# Patient Record
Sex: Female | Born: 1940
Health system: Southern US, Community
[De-identification: ages and names within clinical notes are randomized; demographics above are authoritative.]

## PROBLEM LIST (undated history)

## (undated) DIAGNOSIS — K8020 Calculus of gallbladder without cholecystitis without obstruction: Secondary | ICD-10-CM

## (undated) DIAGNOSIS — Z87442 Personal history of urinary calculi: Secondary | ICD-10-CM

## (undated) DIAGNOSIS — M810 Age-related osteoporosis without current pathological fracture: Secondary | ICD-10-CM

## (undated) DIAGNOSIS — M8580 Other specified disorders of bone density and structure, unspecified site: Secondary | ICD-10-CM

## (undated) DIAGNOSIS — K649 Unspecified hemorrhoids: Secondary | ICD-10-CM

## (undated) DIAGNOSIS — H6090 Unspecified otitis externa, unspecified ear: Secondary | ICD-10-CM

## (undated) DIAGNOSIS — B029 Zoster without complications: Secondary | ICD-10-CM

## (undated) DIAGNOSIS — E039 Hypothyroidism, unspecified: Secondary | ICD-10-CM

## (undated) DIAGNOSIS — K5900 Constipation, unspecified: Secondary | ICD-10-CM

## (undated) DIAGNOSIS — R32 Unspecified urinary incontinence: Secondary | ICD-10-CM

## (undated) DIAGNOSIS — M533 Sacrococcygeal disorders, not elsewhere classified: Principal | ICD-10-CM

## (undated) HISTORY — PX: APPENDECTOMY: SHX54

## (undated) HISTORY — PX: TONSILLECTOMY: SUR1361

## (undated) HISTORY — DX: Unspecified urinary incontinence: R32

## (undated) HISTORY — DX: Zoster without complications: B02.9

## (undated) HISTORY — DX: Calculus of gallbladder without cholecystitis without obstruction: K80.20

## (undated) HISTORY — PX: TUBAL LIGATION: SHX77

## (undated) HISTORY — DX: Age-related osteoporosis without current pathological fracture: M81.0

## (undated) HISTORY — DX: Unspecified hemorrhoids: K64.9

## (undated) HISTORY — PX: ABDOMINAL HYSTERECTOMY: SHX81

## (undated) HISTORY — DX: Constipation, unspecified: K59.00

## (undated) HISTORY — DX: Hypothyroidism, unspecified: E03.9

## (undated) HISTORY — DX: Sacrococcygeal disorders, not elsewhere classified: M53.3

## (undated) HISTORY — PX: OTHER SURGICAL HISTORY: SHX169

## (undated) HISTORY — DX: Other specified disorders of bone density and structure, unspecified site: M85.80

## (undated) HISTORY — DX: Unspecified otitis externa, unspecified ear: H60.90

---

## 2000-10-24 ENCOUNTER — Ambulatory Visit (HOSPITAL_COMMUNITY): Admission: RE | Admit: 2000-10-24 | Discharge: 2000-10-24 | Payer: Self-pay | Admitting: Specialist

## 2000-10-24 ENCOUNTER — Encounter: Payer: Self-pay | Admitting: Specialist

## 2001-03-03 ENCOUNTER — Encounter (HOSPITAL_COMMUNITY): Admission: RE | Admit: 2001-03-03 | Discharge: 2001-04-02 | Payer: Self-pay | Admitting: Preventative Medicine

## 2001-03-21 ENCOUNTER — Encounter: Payer: Self-pay | Admitting: Preventative Medicine

## 2001-03-21 ENCOUNTER — Ambulatory Visit (HOSPITAL_COMMUNITY): Admission: RE | Admit: 2001-03-21 | Discharge: 2001-03-21 | Payer: Self-pay | Admitting: Preventative Medicine

## 2001-04-18 ENCOUNTER — Encounter (HOSPITAL_COMMUNITY): Admission: RE | Admit: 2001-04-18 | Discharge: 2001-05-18 | Payer: Self-pay | Admitting: *Deleted

## 2001-05-26 ENCOUNTER — Encounter (HOSPITAL_COMMUNITY): Admission: RE | Admit: 2001-05-26 | Discharge: 2001-06-25 | Payer: Self-pay | Admitting: *Deleted

## 2001-11-07 ENCOUNTER — Encounter: Payer: Self-pay | Admitting: Specialist

## 2001-11-07 ENCOUNTER — Ambulatory Visit (HOSPITAL_COMMUNITY): Admission: RE | Admit: 2001-11-07 | Discharge: 2001-11-07 | Payer: Self-pay | Admitting: Specialist

## 2002-11-12 ENCOUNTER — Encounter: Payer: Self-pay | Admitting: Specialist

## 2002-11-12 ENCOUNTER — Ambulatory Visit (HOSPITAL_COMMUNITY): Admission: RE | Admit: 2002-11-12 | Discharge: 2002-11-12 | Payer: Self-pay | Admitting: Specialist

## 2003-11-20 ENCOUNTER — Ambulatory Visit (HOSPITAL_COMMUNITY): Admission: RE | Admit: 2003-11-20 | Discharge: 2003-11-20 | Payer: Self-pay | Admitting: Family Medicine

## 2003-12-16 ENCOUNTER — Ambulatory Visit (HOSPITAL_COMMUNITY): Admission: RE | Admit: 2003-12-16 | Discharge: 2003-12-16 | Payer: Self-pay | Admitting: Otolaryngology

## 2003-12-16 ENCOUNTER — Encounter (INDEPENDENT_AMBULATORY_CARE_PROVIDER_SITE_OTHER): Payer: Self-pay | Admitting: *Deleted

## 2003-12-16 ENCOUNTER — Ambulatory Visit (HOSPITAL_BASED_OUTPATIENT_CLINIC_OR_DEPARTMENT_OTHER): Admission: RE | Admit: 2003-12-16 | Discharge: 2003-12-16 | Payer: Self-pay | Admitting: Otolaryngology

## 2003-12-30 ENCOUNTER — Ambulatory Visit (HOSPITAL_COMMUNITY): Admission: RE | Admit: 2003-12-30 | Discharge: 2003-12-30 | Payer: Self-pay | Admitting: Specialist

## 2004-12-30 ENCOUNTER — Ambulatory Visit (HOSPITAL_COMMUNITY): Admission: RE | Admit: 2004-12-30 | Discharge: 2004-12-30 | Payer: Self-pay | Admitting: Specialist

## 2005-06-10 ENCOUNTER — Ambulatory Visit: Payer: Self-pay | Admitting: Internal Medicine

## 2005-06-29 ENCOUNTER — Encounter (INDEPENDENT_AMBULATORY_CARE_PROVIDER_SITE_OTHER): Payer: Self-pay | Admitting: *Deleted

## 2005-06-29 ENCOUNTER — Ambulatory Visit (HOSPITAL_COMMUNITY): Admission: RE | Admit: 2005-06-29 | Discharge: 2005-06-29 | Payer: Self-pay | Admitting: Internal Medicine

## 2005-06-29 ENCOUNTER — Ambulatory Visit: Payer: Self-pay | Admitting: Internal Medicine

## 2006-04-18 ENCOUNTER — Ambulatory Visit (HOSPITAL_COMMUNITY): Admission: RE | Admit: 2006-04-18 | Discharge: 2006-04-18 | Payer: Self-pay | Admitting: Obstetrics and Gynecology

## 2006-05-31 ENCOUNTER — Ambulatory Visit (HOSPITAL_COMMUNITY): Admission: RE | Admit: 2006-05-31 | Discharge: 2006-05-31 | Payer: Self-pay | Admitting: Obstetrics & Gynecology

## 2007-05-02 ENCOUNTER — Ambulatory Visit (HOSPITAL_COMMUNITY): Admission: RE | Admit: 2007-05-02 | Discharge: 2007-05-02 | Payer: Self-pay | Admitting: Family Medicine

## 2007-05-09 ENCOUNTER — Ambulatory Visit (HOSPITAL_COMMUNITY): Admission: RE | Admit: 2007-05-09 | Discharge: 2007-05-09 | Payer: Self-pay | Admitting: Family Medicine

## 2007-06-14 ENCOUNTER — Encounter (INDEPENDENT_AMBULATORY_CARE_PROVIDER_SITE_OTHER): Payer: Self-pay | Admitting: Urology

## 2007-06-14 ENCOUNTER — Ambulatory Visit (HOSPITAL_COMMUNITY): Admission: RE | Admit: 2007-06-14 | Discharge: 2007-06-14 | Payer: Self-pay | Admitting: Urology

## 2008-05-03 ENCOUNTER — Ambulatory Visit (HOSPITAL_COMMUNITY): Admission: RE | Admit: 2008-05-03 | Discharge: 2008-05-03 | Payer: Self-pay | Admitting: Family Medicine

## 2008-09-16 ENCOUNTER — Ambulatory Visit (HOSPITAL_COMMUNITY): Admission: RE | Admit: 2008-09-16 | Discharge: 2008-09-16 | Payer: Self-pay | Admitting: Obstetrics & Gynecology

## 2009-05-05 ENCOUNTER — Ambulatory Visit (HOSPITAL_COMMUNITY): Admission: RE | Admit: 2009-05-05 | Discharge: 2009-05-05 | Payer: Self-pay | Admitting: Family Medicine

## 2010-02-21 ENCOUNTER — Encounter: Payer: Self-pay | Admitting: Family Medicine

## 2010-02-22 ENCOUNTER — Encounter: Payer: Self-pay | Admitting: Family Medicine

## 2010-04-06 ENCOUNTER — Other Ambulatory Visit (HOSPITAL_BASED_OUTPATIENT_CLINIC_OR_DEPARTMENT_OTHER): Payer: Self-pay | Admitting: Pediatrics

## 2010-04-06 DIAGNOSIS — Z139 Encounter for screening, unspecified: Secondary | ICD-10-CM

## 2010-05-18 ENCOUNTER — Ambulatory Visit (HOSPITAL_COMMUNITY): Payer: Self-pay

## 2010-05-21 ENCOUNTER — Ambulatory Visit (HOSPITAL_COMMUNITY)
Admission: RE | Admit: 2010-05-21 | Discharge: 2010-05-21 | Disposition: A | Discharge status: Home / Self Care | Payer: Medicare Other | Source: Ambulatory Visit | Attending: Pediatrics | Admitting: Pediatrics

## 2010-05-21 DIAGNOSIS — Z139 Encounter for screening, unspecified: Secondary | ICD-10-CM

## 2010-05-21 DIAGNOSIS — Z1231 Encounter for screening mammogram for malignant neoplasm of breast: Secondary | ICD-10-CM | POA: Insufficient documentation

## 2010-06-16 NOTE — Op Note (Signed)
NAME:  Kelly Cook, Kelly Cook             ACCOUNT NO.:  [id number]   MEDICAL RECORD NO.:  [id number]          PATIENT TYPE:  AMB   LOCATION:  DAY                           FACILITY:  [facility]   PHYSICIAN:  Ky Barban, M.D.DATE OF BIRTH:  [birth year]   DATE OF PROCEDURE:  06/14/2007  DATE OF DISCHARGE:                               OPERATIVE REPORT   PREOPERATIVE DIAGNOSIS:  Right hydroureter.   POSTOPERATIVE DIAGNOSIS:  Normal right retrograde pyelogram.   PROCEDURE:  Cystoscopy and right retrograde pyelogram.   ANESTHESIA:  IV sedation plus local Xylocaine jelly in the urethra.   DESCRIPTION OF PROCEDURE:  The patient was given IV sedation MAC and  local anesthesia in the urethra.  After waiting adequate time, #25  cystoscope was introduced.  The large ureteral orifice looks normal and  a wedge catheter was introduced.  Hypaque was injected under  fluoroscopic control.  The right ureter looks normal of caliber.  I do  not see any filling defect.  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  The lower  collecting system is slightly dilated.  I do not see any filling defect.  All the instruments are removed.  The patient left the operating room in  the satisfactory condition.  I also want to mention, there is a cystic  area above the right kidney, which is outside and size is about 6 x 6 cm  and needs further clarification by the radiologist.      Ky Barban, M.D.  Electronically Signed     MIJ/MEDQ  D:  06/14/2007  T:  06/15/2007  Job:  [id number]

## 2010-06-19 NOTE — Op Note (Signed)
NAME:  Kelly Cook, Kelly Cook             ACCOUNT NO.:  [id number]   MEDICAL RECORD NO.:  [id number]          PATIENT TYPE:  AMB   LOCATION:  DAY                           FACILITY:  [facility]   PHYSICIAN:  Lionel December, M.D.    DATE OF BIRTH:  [birth year]   DATE OF PROCEDURE:  DATE OF DISCHARGE:                                 OPERATIVE REPORT   PROCEDURE:  Colonoscopy with polypectomy.   INDICATION:  A 70 year old Caucasian female with intermittent hematochezia  presumed to be secondary to hemorrhoids, who was recently noted to have heme-  positive stools.  Family history is negative for colorectal carcinoma.  Procedure risks were reviewed with the patient, informed consent was  obtained.   MEDS FOR CONSCIOUS SEDATION:  Demerol 50 mg IV, Versed 7 mg IV in divided  dose.   FINDINGS:  Procedure performed in endoscopy suite.  The patient's vital  signs and O2 sat were monitored during the procedure and remained stable.  The patient was placed in the left lateral position, rectal examination  performed.  No abnormality noted on external or digital exam.  Olympus  videoscope was placed in the rectum and advanced under vision to sigmoid  colon with tight turn.  The patient was placed in supine position.  I was  able to advance the scope proximally.  Preparation was satisfactory.  Scope  was passed to the cecum which was identified by appendiceal stump and  ileocecal valve.  Pictures taken for the record.  As the scope was withdrawn  colonic mucosa was carefully examined.  There was a small lipoma at  ascending colon which was left alone.  There was 6-7 mm sessile polyp at  rectosigmoid junction which was raised with submucosal injection of normal  saline and snared in one piece.  This polyp was retrieved for histology  examination.  Rectal mucosa was normal.  Scope was retroflexed to examine  anorectal junction and small hemorrhoids were noted below the dentate line.  Endoscope was then  withdrawn.  The patient tolerated the procedure well.   FINAL DIAGNOSIS:  1.  A 6 to 7 mm sessile polyp snared from rectosigmoid junction.  2.  Small external hemorrhoids.  3.  Small lipoma at the ascending colon, an incidental finding.   RECOMMENDATIONS:  No aspirin for 7 days.   I will be contacting the patient with results of biopsy.      Lionel December, M.D.  Electronically Signed     NR/MEDQ  D:  06/29/2005  T:  06/29/2005  Job:  [id number]   cc:   Cyril Mourning, MSN   Jeoffrey Massed, MD  Fax: [phone]

## 2010-06-19 NOTE — Op Note (Signed)
NAME:  Kelly Cook, Kelly Cook             ACCOUNT NO.:  [id number]   MEDICAL RECORD NO.:  [id number]          PATIENT TYPE:  AMB   LOCATION:  DSC                          FACILITY:  [facility]   PHYSICIAN:  Jefry H. Pollyann Kennedy, MD     DATE OF BIRTH:  02-01-1941   DATE OF PROCEDURE:  12/16/2003  DATE OF DISCHARGE:                                 OPERATIVE REPORT   PREOPERATIVE DIAGNOSIS:  Right parotid mass.   POSTOPERATIVE DIAGNOSIS:  Right parotid mass.   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   SURGEON:  Jefry H. Pollyann Kennedy, M.D.   ASSISTANT:  Kinnie Scales. Annalee Genta, M.D.   General endotracheal anesthesia was used.  No complications.  Blood loss 10  mL.   FINDINGS:  A 2 cm rounded, mobile mass in the deep aspect of the tail of the  parotid gland.  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   REFERRING PHYSICIAN:  Jeoffrey Massed, M.D.   HISTORY:  This is a 70 year old lady who was found to have a mass in the  right parotid gland on CT evaluation.  Risks, benefits, alternatives, and  complications of the procedure were explained to the patient, who seemed to  understand and agreed to surgery.   PROCEDURE:  The patient was taken to the operating room and placed on the  operating table in a supine position.  Following induction of general  endotracheal anesthesia, the face was prepped and draped in the standard  fashion.  A preauricular incision was outlined with a marking pen with  continuation behind the ear lobe, around the mastoid process, and down into  a lower cervical skin crease.  Electrocautery was used to incise the skin  and subcutaneous tissue.  The great auricular nerve was identified and the  posterior branch was preserved and reflected posteriorly.  The skin flap was  developed anteriorly.  The parotid tail was dissected off of the upper  sternocleidomastoid muscle and then off of  the external auditory canal.  The  main trunk of the facial nerve was identified just superior to the posterior  belly of the digastric muscle.  This was followed out to the pes anserinus  and through all branches.  A complete lateral lobe dissection was  accomplished, preserving all branches of the facial nerve.  After the  lateral lobe was removed, the deeper lobe inferiorly was inspected.  The  mass was identified.  This was carefully dissected posteriorly to the lower  branches of the facial nerve just behind the angle of the mandible.  Using  blunt dissection, this lesion was removed in its entirety and sent for  frozen section evaluation.  Bipolar cautery was used as needed for  hemostasis, as were 4-0 silk ties.  The wound was irrigated with saline,  hemostasis was completed, and the wound was closed in layers using 3-0  chromic on the deep layer and running 5-0 nylon on the skin.  A 7 [place]  round JP drain was left in the wound, exited through  the inferior aspect of  the incision, and secured in place with a  nylon suture.  Prior to closure, the main trunk and upper and lower  divisions were tested using a Folger nerve stimulator at half milliamp, and  there was full movement in all branches.  The patient was then awakened from  anesthesia, extubated, and transferred to recovery in stable condition.      Jefr   JHR/MEDQ  D:  12/16/2003  T:  12/16/2003  Job:  [id number]   cc:   Jeoffrey Massed, MD  [address]  [place]  [place] [address]  Fax: [phone]

## 2010-06-19 NOTE — Consult Note (Signed)
NAME:  Kelly Cook             ACCOUNT NO.:  [id number]   MEDICAL RECORD NO.:  [id number]          PATIENT TYPE:  AMB   LOCATION:  DAY                           FACILITY:  [facility]   PHYSICIAN:  Lionel December, M.D.    DATE OF BIRTH:  1940-12-25   DATE OF CONSULTATION:  06/10/2005  DATE OF DISCHARGE:                                   CONSULTATION   REASON FOR CONSULTATION:  Heme-positive stools.   HISTORY OF PRESENT ILLNESS:  Kelly Cook is a 70 year old Caucasian female who is  referred through the courtesy of Ms. Cyril Mourning, and Dr. Rayna Sexton  office for GI evaluation.  She recently had a yearly exam at their office  and was noted to have heme-positive stools.  She does give history of  intermittent hematochezia.  She subsequently had three Hemoccult's which  were negative.  The patient denies diarrhea or constipation, frank bleeding  or melena.  At times, she has soreness secondary to hemorrhoids.  She has a  good appetite.  She has not lost or gained any weight in the last 1 year.  She states she has dysphagia. She states this has been a chronic problem.  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  It is not progressive and  she does not desire any further evaluation.  She also complains of  exertional dyspnea after taking one flight of stairs which was new; however,  this is not associated with palpitations, chest pain or postural symptoms.  She has not discussed this symptom with Dr. Santiago Bumpers, her primary care  physician.  She had recent lab studies which are reviewed below.  She did  have a fingerstick at Dr. Rayna Sexton and the hemoglobin was 12.5 grams.   [name] had  flexible sigmoidoscopy in June 1997. She had external hemorrhoids  and a single polyp was taken out from her sigmoid colon which was  hyperplastic.  She actually has never undergone a full colonoscopy. Review  of the systems is negative for heartburn, hoarseness or chronic cough.   She is on the over  thyroxine 112 mcg q.d., Actonel 35 mg q. weekly, Citrucel  one cap q.d. and Tylenol p.r.n.Marland Kitchen   PAST MEDICAL HISTORY:  Hypothyroidism was diagnosed 20 years ago, osteopenia  1 year ago.  She had a tonsillectomy at age 63 and appendectomy around the  same year.  She had a tubal ligation at age 43 after she had a handicap  daughter.  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   ALLERGIES:  NKA.   FAMILY HISTORY:  Father died of lymphoma at age 35.  The mother at 18 has  dementia but she enjoyed reasonably good health until about 3 years ago.  She is [facility] at the present time. Kelly Cook has three brothers and  they are in good health.  She had one first cousin die of colon carcinoma at  age 41.   SOCIAL HISTORY:  She is married.  She has a daughter who is being cared for  at [facility]. She is around 30 and there is like a 74-year-old.  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  She  smoked cigarettes for 25 years about a pack a day but quit 23 years ago.  She does not drink alcohol.   PHYSICAL EXAM:  GENERAL:  Pleasant, mildly obese, Caucasian female who is in  no acute distress.  She weighs 179 pounds, she is 5 feet 2 inches tall.  VITAL SIGNS:  Pulse 68 per minute, blood pressure 114/80, temperature is  98.8.  HEENT:  Conjunctivae is pink.  Sclerae is nonicteric.  Oropharyngeal mucosa  is normal.  No neck masses or thyromegaly noted.  CARDIAC:  Regular rhythm.  Normal S1, S2.  No murmur or gallop noted.  LUNGS:  Clear to auscultation.  ABDOMEN:  Protuberant but soft and nontender without organomegaly or masses.  RECTAL:  Examination deferred.  EXTREMITIES:  No peripheral edema or clubbing noted.   RECENT LABS:  A comprehensive panel on April 23, 2005 was normal except a  glucose of 120. It was repeated on May 25, 2005 and her fasting glucose  was 113, first one was  nonfasting. Her TSH on May 25, 2005 was 1.942.  She  also had lipid profile report on the chart.   ASSESSMENT:  Kelly Cook is a 70 year old Caucasian female with intermittent  hematochezia possibly secondary to hemorrhoids.  She was noted to have heme-  positive stool which may be due to her hemorrhoids but colonic neoplasm  needs to be ruled out.  She had sigmoidoscopy back in 1997, but she has  never had a colonoscopy.  Her risk for colon carcinoma would be average or  may be just above average since she had one first cousin die of colon  carcinoma at age 100.  However she does not have any worrisome symptoms.  Reassuring to note that her hemoglobin was 12-1/2 grams.   She has exertional dyspnea.  This may be due to gradual weight gain but I  feel this needs to be further evaluated and will be deferred to Dr. Milinda Cave,  her primary care physician.   RECOMMENDATIONS:  Colonoscopy to be performed at [facility] in the near future.  I  have reviewed the procedure and risks with the patient and she is agreeable.   We made copy of all of her lab studies for Dr. Milinda Cave.  She will call their  office and make an office visit for further evaluation of her exertional  dyspnea.   We would like to thank Ms. Cyril Mourning, MSN for the opportunity to  participate in the care of this nice lady.      Lionel December, M.D.  Electronically Signed     NR/MEDQ  D:  06/10/2005  T:  06/11/2005  Job:  [id number]   cc:   Cyril Mourning, MSN   Tilda Burrow, M.D.  Fax: [phone]   Jeoffrey Massed, MD  Fax: [phone]

## 2010-08-20 ENCOUNTER — Other Ambulatory Visit: Payer: Self-pay | Admitting: Obstetrics & Gynecology

## 2010-08-20 DIAGNOSIS — M8580 Other specified disorders of bone density and structure, unspecified site: Secondary | ICD-10-CM

## 2010-09-28 ENCOUNTER — Ambulatory Visit (HOSPITAL_COMMUNITY)
Admission: RE | Admit: 2010-09-28 | Discharge: 2010-09-28 | Disposition: A | Discharge status: Home / Self Care | Payer: Medicare Other | Source: Ambulatory Visit | Attending: Obstetrics & Gynecology | Admitting: Obstetrics & Gynecology

## 2010-09-28 DIAGNOSIS — M8580 Other specified disorders of bone density and structure, unspecified site: Secondary | ICD-10-CM

## 2010-09-28 DIAGNOSIS — M899 Disorder of bone, unspecified: Secondary | ICD-10-CM | POA: Insufficient documentation

## 2010-09-28 DIAGNOSIS — M949 Disorder of cartilage, unspecified: Secondary | ICD-10-CM | POA: Insufficient documentation

## 2010-10-16 ENCOUNTER — Encounter: Payer: Self-pay | Admitting: *Deleted

## 2010-10-16 ENCOUNTER — Emergency Department (HOSPITAL_COMMUNITY): Payer: Medicare Other

## 2010-10-16 ENCOUNTER — Emergency Department (HOSPITAL_COMMUNITY)
Admission: EM | Admit: 2010-10-16 | Discharge: 2010-10-17 | Disposition: A | Discharge status: Home / Self Care | Payer: Medicare Other | Attending: Emergency Medicine | Admitting: Emergency Medicine

## 2010-10-16 DIAGNOSIS — E119 Type 2 diabetes mellitus without complications: Secondary | ICD-10-CM | POA: Insufficient documentation

## 2010-10-16 DIAGNOSIS — R110 Nausea: Secondary | ICD-10-CM | POA: Insufficient documentation

## 2010-10-16 DIAGNOSIS — E079 Disorder of thyroid, unspecified: Secondary | ICD-10-CM | POA: Insufficient documentation

## 2010-10-16 DIAGNOSIS — R6883 Chills (without fever): Secondary | ICD-10-CM

## 2010-10-16 DIAGNOSIS — R509 Fever, unspecified: Secondary | ICD-10-CM | POA: Insufficient documentation

## 2010-10-16 NOTE — ED Provider Notes (Signed)
History     CSN: [id number] Arrival date & time: 10/16/2010  9:22 PM   Chief Complaint  Patient presents with  . Chills     (Include location/radiation/quality/duration/timing/severity/associated sxs/prior treatment) HPI Comments: Seen 2306  Patient is a 70 y.o. female presenting with fever. The history is provided by the patient.  Fever Primary symptoms of the febrile illness include fever. The current episode started today (patient had a flu shot today and this evening around 7:30 developed shaking chills.). This is a new problem. The problem has been resolved. Primary symptoms comment: chills and possible fever, did not take her temperature     Past Medical History  Diagnosis Date  . Diabetes mellitus   . Thyroid disease      Past Surgical History  Procedure Date  . Tonsillectomy   . Appendectomy   . Abdominal hysterectomy     No family history on file.  History  Substance Use Topics  . Smoking status: Not on file  . Smokeless tobacco: Not on file  . Alcohol Use: No    OB History    Grav Para Term Preterm Abortions TAB SAB Ect Mult Living                  Review of Systems  Constitutional: Positive for fever.    Allergies  Review of patient's allergies indicates no known allergies.  Home Medications   Current Outpatient Rx  Name Route Sig Dispense Refill  . CALCIUM-VITAMIN D 500-200 MG-UNIT PO TABS Oral Take 1 tablet by mouth daily.      Marland Kitchen LEVOTHYROXINE SODIUM 100 MCG PO TABS Oral Take 100 mcg by mouth daily.      Marland Kitchen METFORMIN HCL 500 MG PO TABS Oral Take 500 mg by mouth 2 (two) times daily with a meal.      . METAMUCIL PO Oral Take 1 scoop by mouth daily. Dissolved in liquid and drink.     Marland Kitchen SIMVASTATIN 10 MG PO TABS Oral Take 10 mg by mouth daily.      . TRIAMCINOLONE ACETONIDE 0.1 % EX CREA Topical Apply 1 application topically as needed. For rash       Physical Exam    BP 141/72  Pulse 105  Temp(Src) 99.6 F (37.6 C) (Oral)  Resp 16  Ht  5\' 1"  (1.549 m)  Wt 170 lb (77.111 kg)  BMI 32.12 kg/m2  SpO2 93%  Physical Exam  Nursing note and vitals reviewed. Constitutional: She is oriented to person, place, and time. She appears well-developed and well-nourished.  HENT:  Head: Normocephalic and atraumatic.  Right Ear: External ear normal.  Left Ear: External ear normal.  Nose: Nose normal.  Mouth/Throat: Oropharynx is clear and moist.  Eyes: EOM are normal.  Neck: Normal range of motion. Neck supple.  Cardiovascular: Normal rate, normal heart sounds and intact distal pulses.   Pulmonary/Chest: Effort normal and breath sounds normal.  Abdominal: Soft.  Musculoskeletal: Normal range of motion.  Neurological: She is alert and oriented to person, place, and time.  Skin: Skin is warm and dry.    ED Course  Procedures  Results for orders placed during the hospital encounter of 10/16/10  CBC      Component Value Range   WBC 6.2  4.0 - 10.5 (K/uL)   RBC 4.41  3.87 - 5.11 (MIL/uL)   Hemoglobin 13.9  12.0 - 15.0 (g/dL)   HCT 40.9  81.1 - 91.4 (%)   MCV 93.4  78.0 -  100.0 (fL)   MCH 31.5  26.0 - 34.0 (pg)   MCHC 33.7  30.0 - 36.0 (g/dL)   RDW 40.9  81.1 - 91.4 (%)   Platelets 182  150 - 400 (K/uL)  DIFFERENTIAL      Component Value Range   Neutrophils Relative 80 (*) 43 - 77 (%)   Neutro Abs 5.0  1.7 - 7.7 (K/uL)   Lymphocytes Relative 10 (*) 12 - 46 (%)   Lymphs Abs 0.6 (*) 0.7 - 4.0 (K/uL)   Monocytes Relative 8  3 - 12 (%)   Monocytes Absolute 0.5  0.1 - 1.0 (K/uL)   Eosinophils Relative 2  0 - 5 (%)   Eosinophils Absolute 0.2  0.0 - 0.7 (K/uL)   Basophils Relative 1  0 - 1 (%)   Basophils Absolute 0.0  0.0 - 0.1 (K/uL)  BASIC METABOLIC PANEL      Component Value Range   Sodium 136  135 - 145 (mEq/L)   Potassium 3.5  3.5 - 5.1 (mEq/L)   Chloride 103  96 - 112 (mEq/L)   CO2 22  19 - 32 (mEq/L)   Glucose, Bld 143 (*) 70 - 99 (mg/dL)   BUN 16  6 - 23 (mg/dL)   Creatinine, Ser 7.82  0.50 - 1.10 (mg/dL)    Calcium 9.9  8.4 - 10.5 (mg/dL)   GFR calc non Af Amer >60  >60 (mL/min)   GFR calc Af Amer >60  >60 (mL/min)  URINALYSIS, ROUTINE W REFLEX MICROSCOPIC      Component Value Range   Color, Urine YELLOW  YELLOW    Appearance CLEAR  CLEAR    Specific Gravity, Urine 1.020  1.005 - 1.030    pH 7.0  5.0 - 8.0    Glucose, UA NEGATIVE  NEGATIVE (mg/dL)   Hgb urine dipstick TRACE (*) NEGATIVE    Bilirubin Urine NEGATIVE  NEGATIVE    Ketones, ur NEGATIVE  NEGATIVE (mg/dL)   Protein, ur NEGATIVE  NEGATIVE (mg/dL)   Urobilinogen, UA 0.2  0.0 - 1.0 (mg/dL)   Nitrite NEGATIVE  NEGATIVE    Leukocytes, UA NEGATIVE  NEGATIVE   URINE MICROSCOPIC-ADD ON      Component Value Range   Squamous Epithelial / LPF RARE  RARE    WBC, UA 0-2  <3 (WBC/hpf)   RBC / HPF 0-2  <3 (RBC/hpf)   Bacteria, UA RARE  RARE    Dg Chest 2 View  10/17/2010  *RADIOLOGY REPORT*  Clinical Data: Nausea, muscle spasms, former smoker, diabetes, recent flu shot  CHEST - 2 VIEW  Comparison: None  Findings: Upper normal heart size. Mediastinal contours and pulmonary vascularity normal. Minimal peribronchial thickening. No pulmonary infiltrate, pleural effusion or pneumothorax. No acute osseous findings.  IMPRESSION: Minimal bronchitic changes.  Original Report Authenticated By: Lollie Marrow, M.D.   Dg Bone Density  09/28/2010  The Bone Mineral Densitometry report has been sent to the ordering physician.  This report can be viewed through [facility]/EPIC (Steps: Chart Review>>Imaging>>PACS Images)  or by logging into [employer].  Original Report Authenticated By: Lollie Marrow, M.D. lts  Patient with flu shot today who developed shaking chills. Labs unremarkable. Chest xray normal. VSS. Will follow up with her doctor. MDM Reviewed: nursing note and vitals Interpretation: labs and x-ray          Nicoletta Dress. Colon Branch, MD 10/17/10 [id number]

## 2010-10-16 NOTE — ED Notes (Signed)
States she had a flu shot today, now she has chills

## 2010-10-16 NOTE — ED Notes (Signed)
Alert, talking says she had a flu shot this am and later had chill for app 1 hour.  No chills now, feels better,  No distress.No cough or cold sx . No sx of uti

## 2010-10-17 LAB — DIFFERENTIAL
Basophils Absolute: 0 10*3/uL (ref 0.0–0.1)
Eosinophils Absolute: 0.2 10*3/uL (ref 0.0–0.7)
Eosinophils Relative: 2 % (ref 0–5)
Lymphocytes Relative: 10 % — ABNORMAL LOW (ref 12–46)
Lymphs Abs: 0.6 10*3/uL — ABNORMAL LOW (ref 0.7–4.0)
Neutrophils Relative %: 80 % — ABNORMAL HIGH (ref 43–77)

## 2010-10-17 LAB — URINALYSIS, ROUTINE W REFLEX MICROSCOPIC
Bilirubin Urine: NEGATIVE
Glucose, UA: NEGATIVE mg/dL
Leukocytes, UA: NEGATIVE
Nitrite: NEGATIVE
Protein, ur: NEGATIVE mg/dL
Specific Gravity, Urine: 1.02 (ref 1.005–1.030)
Urobilinogen, UA: 0.2 mg/dL (ref 0.0–1.0)
pH: 7 (ref 5.0–8.0)

## 2010-10-17 LAB — CBC
HCT: 41.2 % (ref 36.0–46.0)
Hemoglobin: 13.9 g/dL (ref 12.0–15.0)
MCHC: 33.7 g/dL (ref 30.0–36.0)
MCV: 93.4 fL (ref 78.0–100.0)
Platelets: 182 10*3/uL (ref 150–400)
RBC: 4.41 MIL/uL (ref 3.87–5.11)
RDW: 12.9 % (ref 11.5–15.5)
WBC: 6.2 10*3/uL (ref 4.0–10.5)

## 2010-10-17 LAB — BASIC METABOLIC PANEL
BUN: 16 mg/dL (ref 6–23)
CO2: 22 mEq/L (ref 19–32)
Calcium: 9.9 mg/dL (ref 8.4–10.5)
Creatinine, Ser: 0.69 mg/dL (ref 0.50–1.10)
GFR calc Af Amer: >60 mL/min (ref >60)
GFR calc non Af Amer: >60 mL/min (ref >60)
Glucose, Bld: 143 mg/dL — ABNORMAL HIGH (ref 70–99)
Potassium: 3.5 mEq/L (ref 3.5–5.1)
Sodium: 136 mEq/L (ref 135–145)

## 2010-10-17 LAB — URINE MICROSCOPIC-ADD ON

## 2011-02-20 ENCOUNTER — Other Ambulatory Visit: Payer: Self-pay | Admitting: Obstetrics & Gynecology

## 2011-04-13 ENCOUNTER — Other Ambulatory Visit (HOSPITAL_COMMUNITY): Payer: Self-pay | Admitting: Pediatrics

## 2011-04-13 DIAGNOSIS — Z139 Encounter for screening, unspecified: Secondary | ICD-10-CM

## 2011-05-25 ENCOUNTER — Ambulatory Visit (HOSPITAL_COMMUNITY)
Admission: RE | Admit: 2011-05-25 | Discharge: 2011-05-25 | Disposition: A | Discharge status: Home / Self Care | Payer: Medicare Other | Source: Ambulatory Visit | Attending: Pediatrics | Admitting: Pediatrics

## 2011-05-25 DIAGNOSIS — Z1231 Encounter for screening mammogram for malignant neoplasm of breast: Secondary | ICD-10-CM | POA: Insufficient documentation

## 2011-05-25 DIAGNOSIS — Z139 Encounter for screening, unspecified: Secondary | ICD-10-CM

## 2012-03-20 ENCOUNTER — Other Ambulatory Visit (HOSPITAL_COMMUNITY): Payer: Self-pay | Admitting: Internal Medicine

## 2012-04-27 ENCOUNTER — Other Ambulatory Visit (HOSPITAL_COMMUNITY): Payer: Self-pay | Admitting: Internal Medicine

## 2012-04-27 DIAGNOSIS — Z139 Encounter for screening, unspecified: Secondary | ICD-10-CM

## 2012-05-26 ENCOUNTER — Ambulatory Visit (HOSPITAL_COMMUNITY)
Admission: RE | Admit: 2012-05-26 | Discharge: 2012-05-26 | Disposition: A | Discharge status: Home / Self Care | Payer: Medicare Other | Source: Ambulatory Visit | Attending: Internal Medicine | Admitting: Internal Medicine

## 2012-05-26 DIAGNOSIS — Z139 Encounter for screening, unspecified: Secondary | ICD-10-CM

## 2012-05-26 DIAGNOSIS — Z1231 Encounter for screening mammogram for malignant neoplasm of breast: Secondary | ICD-10-CM | POA: Insufficient documentation

## 2012-06-15 ENCOUNTER — Encounter: Payer: Self-pay | Admitting: *Deleted

## 2012-06-16 ENCOUNTER — Ambulatory Visit (INDEPENDENT_AMBULATORY_CARE_PROVIDER_SITE_OTHER): Payer: 59 | Admitting: Adult Health

## 2012-06-16 ENCOUNTER — Encounter: Payer: Self-pay | Admitting: Adult Health

## 2012-06-16 VITALS — BP 120/70 | Ht 61.0 in | Wt 169.0 lb

## 2012-06-16 DIAGNOSIS — Z01419 Encounter for gynecological examination (general) (routine) without abnormal findings: Secondary | ICD-10-CM

## 2012-06-16 DIAGNOSIS — K5900 Constipation, unspecified: Secondary | ICD-10-CM

## 2012-06-16 DIAGNOSIS — E039 Hypothyroidism, unspecified: Secondary | ICD-10-CM | POA: Insufficient documentation

## 2012-06-16 DIAGNOSIS — M810 Age-related osteoporosis without current pathological fracture: Secondary | ICD-10-CM

## 2012-06-16 DIAGNOSIS — E119 Type 2 diabetes mellitus without complications: Secondary | ICD-10-CM | POA: Insufficient documentation

## 2012-06-16 DIAGNOSIS — Z1212 Encounter for screening for malignant neoplasm of rectum: Secondary | ICD-10-CM

## 2012-06-16 HISTORY — DX: Hypothyroidism, unspecified: E03.9

## 2012-06-16 HISTORY — DX: Constipation, unspecified: K59.00

## 2012-06-16 LAB — HEMOCCULT GUIAC POC 1CARD (OFFICE): Fecal Occult Blood, POC: NEGATIVE

## 2012-06-16 NOTE — Patient Instructions (Addendum)
Follow up in 2 years  Mammogram yearly Colonoscopy 2017 Labs at PCP Sign up for my chart

## 2012-06-16 NOTE — Assessment & Plan Note (Signed)
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

## 2012-06-16 NOTE — Progress Notes (Signed)
Patient ID: Wendall Papa, female   DOB: 1940-04-09, 72 y.o.   MRN: [id number] History of Present Illness: Dewayne Hatch is a 72 year old white female married in for her gyn physical. She is retired and has a garden.  Current Medications, Allergies, Past Medical History, Past Surgical History, Family History and Social History were reviewed in [employer] record.    Review of Systems: Patient denies any headaches, blurred vision, shortness of breath, chest pain, abdominal pain, problems with  urination. She has constipation and takes metamucil. No longer has sex. No mood changes  Physical Exam:Blood pressure 120/70, height 5\' 1"  (1.549 m), weight 169 lb (76.658 kg). General:  Well developed, well nourished, no acute distress Skin:  Warm and dry Neck:  Midline trachea, normal thyroid, no carotid bruits heard Lungs; Clear to auscultation bilaterally Breast:  No dominant palpable mass, retraction, or nipple discharge Cardiovascular: Regular rate and rhythm Abdomen:  Soft, non tender, no hepatosplenomegaly Pelvic:  External genitalia is normal in appearance for age.  The vagina is atrohphic. The cervix and uterus are absent.  No adnexal masses or tenderness noted. Rectal: Good sphincter tone, no polyps, or hemorrhoids felt.  Hemoccult negative. Extremities:  No swelling or varicosities noted Psych:  Alert and cooperative,in good mood, seems happy  Impression: Yearly gyn exam, no pap Hypothyroidism Diabetes, last A1c 6.1 per pt. Constipation Osteopenia   Plan: Mammogram yearly  Colonoscopy in 2017 Labs with PCP Physical in 2 years

## 2012-11-30 ENCOUNTER — Ambulatory Visit: Payer: 59 | Admitting: Adult Health

## 2012-12-01 ENCOUNTER — Encounter: Payer: Self-pay | Admitting: Adult Health

## 2012-12-01 ENCOUNTER — Encounter (INDEPENDENT_AMBULATORY_CARE_PROVIDER_SITE_OTHER): Payer: Self-pay

## 2012-12-01 ENCOUNTER — Ambulatory Visit (INDEPENDENT_AMBULATORY_CARE_PROVIDER_SITE_OTHER): Payer: 59 | Admitting: Adult Health

## 2012-12-01 VITALS — BP 142/70 | Ht 62.0 in | Wt 171.0 lb

## 2012-12-01 DIAGNOSIS — H60399 Other infective otitis externa, unspecified ear: Secondary | ICD-10-CM

## 2012-12-01 DIAGNOSIS — H6091 Unspecified otitis externa, right ear: Secondary | ICD-10-CM

## 2012-12-01 DIAGNOSIS — M533 Sacrococcygeal disorders, not elsewhere classified: Secondary | ICD-10-CM | POA: Insufficient documentation

## 2012-12-01 DIAGNOSIS — H6090 Unspecified otitis externa, unspecified ear: Secondary | ICD-10-CM | POA: Insufficient documentation

## 2012-12-01 HISTORY — DX: Sacrococcygeal disorders, not elsewhere classified: M53.3

## 2012-12-01 HISTORY — DX: Unspecified otitis externa, unspecified ear: H60.90

## 2012-12-01 MED ORDER — NAPROXEN 375 MG PO TBEC: 1.0000 | DELAYED_RELEASE_TABLET | Freq: Two times a day (BID) | ORAL | Status: DC

## 2012-12-01 MED ORDER — CIPROFLOXACIN-HYDROCORTISONE 0.2-1 % OT SUSP: 3.0000 [drp] | Freq: Two times a day (BID) | OTIC | Status: DC

## 2012-12-01 NOTE — Progress Notes (Signed)
Subjective:     Patient ID: Wendall Papa, female   DOB: Feb 27, 1940, 72 y.o.   MRN: [id number]  HPI Dewayne Hatch is a 72 year old white female in complaining of pain in tail bone, no known injury and right ear hurts x about 2 weeks feel like in water.  Review of Systems See HPI Reviewed past medical,surgical, social and family history. Reviewed medications and allergies.     Objective:   Physical Exam BP 142/70  Ht 5\' 2"  (1.575 m)  Wt 171 lb (77.565 kg)  BMI 31.27 kg/m2   Left ear clear TM pearly gray, right ear has redness and mild swelling on canal, on pelvic and rectal exam no pain or masses, on external spine exam tender at coccyx and it is prominent and deviated to left, discussed with Dr Emelda Fear, as she saw chiropractor too.I feel it may be inflammation and arthritis And will try NSAID.  Assessment:     Coccyodynia Otitis externa    Plan:    Rx Naproxen 375 mg 1 bid x 10 days Use warm compress to site, use pillow if sitting on hard surface Rx Cipro HC Otic, 3 gtts bid x 7 days to right ear Follow up in 2 weeks if not better will xray

## 2012-12-01 NOTE — Patient Instructions (Signed)
Follow up in 2 weeks Use warm compress

## 2012-12-15 ENCOUNTER — Encounter: Payer: Self-pay | Admitting: Adult Health

## 2012-12-15 ENCOUNTER — Ambulatory Visit (INDEPENDENT_AMBULATORY_CARE_PROVIDER_SITE_OTHER): Payer: 59 | Admitting: Adult Health

## 2012-12-15 VITALS — BP 118/60 | Ht 62.0 in | Wt 166.0 lb

## 2012-12-15 DIAGNOSIS — M533 Sacrococcygeal disorders, not elsewhere classified: Secondary | ICD-10-CM

## 2012-12-15 NOTE — Patient Instructions (Signed)
continue what's working Call prn

## 2012-12-15 NOTE — Progress Notes (Signed)
Subjective:     Patient ID: Kelly Cook, female   DOB: Nov 29, 1940, 72 y.o.   MRN: [id number]  HPI Kelly Cook is back in follow up for right otitis externa and pain in her tail bone.She says she is much better, can sleep and exercise with pain.  Review of Systems See HPI Reviewed past medical,surgical, social and family history. Reviewed medications and allergies.     Objective:   Physical Exam BP 118/60  Ht 5\' 2"  (1.575 m)  Wt 166 lb (75.297 kg)  BMI 30.35 kg/m2   right ear looks good, no redness or swelling, and she says tail bone better,  Assessment:    Coccygodynia-better Resolved otitis externa    Plan:     Follow up prn  Can use aleve if needed now, continue to exercise, but if pain returns back off

## 2013-03-22 ENCOUNTER — Other Ambulatory Visit (HOSPITAL_COMMUNITY): Payer: Self-pay | Admitting: Internal Medicine

## 2013-03-22 DIAGNOSIS — M810 Age-related osteoporosis without current pathological fracture: Secondary | ICD-10-CM

## 2013-03-28 ENCOUNTER — Other Ambulatory Visit (HOSPITAL_COMMUNITY): Payer: Medicare Other

## 2013-03-28 ENCOUNTER — Ambulatory Visit (HOSPITAL_COMMUNITY)
Admission: RE | Admit: 2013-03-28 | Discharge: 2013-03-28 | Disposition: A | Discharge status: Home / Self Care | Payer: Medicare Other | Source: Ambulatory Visit | Attending: Internal Medicine | Admitting: Internal Medicine

## 2013-03-28 DIAGNOSIS — M810 Age-related osteoporosis without current pathological fracture: Secondary | ICD-10-CM

## 2013-03-28 DIAGNOSIS — Z13820 Encounter for screening for osteoporosis: Secondary | ICD-10-CM | POA: Insufficient documentation

## 2013-04-03 ENCOUNTER — Telehealth: Payer: Self-pay | Admitting: Adult Health

## 2013-04-03 NOTE — Telephone Encounter (Signed)
Left message to have her call me.

## 2013-04-25 ENCOUNTER — Other Ambulatory Visit (HOSPITAL_COMMUNITY): Payer: Self-pay | Admitting: Internal Medicine

## 2013-04-25 DIAGNOSIS — Z1231 Encounter for screening mammogram for malignant neoplasm of breast: Secondary | ICD-10-CM

## 2013-05-29 ENCOUNTER — Ambulatory Visit (HOSPITAL_COMMUNITY)
Admission: RE | Admit: 2013-05-29 | Discharge: 2013-05-29 | Disposition: A | Discharge status: Home / Self Care | Payer: Medicare Other | Source: Ambulatory Visit | Attending: Internal Medicine | Admitting: Internal Medicine

## 2013-05-29 DIAGNOSIS — Z1231 Encounter for screening mammogram for malignant neoplasm of breast: Secondary | ICD-10-CM

## 2013-06-26 ENCOUNTER — Encounter: Payer: Self-pay | Admitting: Adult Health

## 2013-06-26 ENCOUNTER — Ambulatory Visit (INDEPENDENT_AMBULATORY_CARE_PROVIDER_SITE_OTHER): Payer: 59 | Admitting: Adult Health

## 2013-06-26 VITALS — BP 118/72 | HR 74 | Ht 62.0 in | Wt 173.0 lb

## 2013-06-26 DIAGNOSIS — Z01419 Encounter for gynecological examination (general) (routine) without abnormal findings: Secondary | ICD-10-CM

## 2013-06-26 DIAGNOSIS — Z1212 Encounter for screening for malignant neoplasm of rectum: Secondary | ICD-10-CM

## 2013-06-26 LAB — HEMOCCULT GUIAC POC 1CARD (OFFICE): Fecal Occult Blood, POC: NEGATIVE

## 2013-06-26 NOTE — Progress Notes (Signed)
Patient ID: Kelly Cook, female   DOB: 02/02/1940, 73 y.o.   MRN: [id number] History of Present Illness: Kelly "Lelon Frohlich" is a 73 year old white female, married in for a physical. No complaints.  Current Medications, Allergies, Past Medical History, Past Surgical History, Family History and Social History were reviewed in [employer] record.     Review of Systems: Patient denies any headaches, blurred vision, shortness of breath, chest pain, abdominal pain, problems with bowel movements, urination, or intercourse. She is not having sex.No joint swelling or mood swings, she line dances and goes to gym 2-3 x per week.    Physical Exam:BP 118/72  Pulse 74  Ht 5\' 2"  (1.575 m)  Wt 173 lb (78.472 kg)  BMI 31.63 kg/m2 General:  Well developed, well nourished, no acute distress Skin:  Warm and dry Neck:  Midline trachea, normal thyroid, no carotid briuts Lungs; Clear to auscultation bilaterally Breast:  No dominant palpable mass, retraction, or nipple discharge Cardiovascular: Regular rate and rhythm Abdomen:  Soft, non tender, no hepatosplenomegaly Pelvic:  External genitalia is normal in appearance for age.  The vagina is atrophic. The cervix and uterus are absent.  No  adnexal masses or tenderness noted. Rectal: Good sphincter tone, no polyps,has internal hemorrhoids felt.  Hemoccult negative. Extremities:  No swelling or rash noted Psych:  No mood changes, alert and cooperative,seems happy   Impression: Yearly gyn exam no pap    Plan: Physical in 2 year Mammogram yearly  Labs with PCP Colonoscopy per Dr Laural Golden

## 2013-06-26 NOTE — Patient Instructions (Signed)
Physical in 2 years Mammogram yearly  labs with PCP Colonoscopy as per Dr Laural Golden

## 2013-12-03 ENCOUNTER — Encounter: Payer: Self-pay | Admitting: Adult Health

## 2014-05-06 ENCOUNTER — Other Ambulatory Visit (HOSPITAL_COMMUNITY): Payer: Self-pay | Admitting: Internal Medicine

## 2014-05-06 DIAGNOSIS — Z1231 Encounter for screening mammogram for malignant neoplasm of breast: Secondary | ICD-10-CM

## 2014-06-07 ENCOUNTER — Ambulatory Visit (HOSPITAL_COMMUNITY)
Admission: RE | Admit: 2014-06-07 | Discharge: 2014-06-07 | Disposition: A | Discharge status: Home / Self Care | Payer: Medicare Other | Source: Ambulatory Visit | Attending: Internal Medicine | Admitting: Internal Medicine

## 2014-06-07 DIAGNOSIS — Z1231 Encounter for screening mammogram for malignant neoplasm of breast: Secondary | ICD-10-CM | POA: Diagnosis present

## 2015-05-22 ENCOUNTER — Other Ambulatory Visit (HOSPITAL_COMMUNITY): Payer: Self-pay | Admitting: Internal Medicine

## 2015-05-22 DIAGNOSIS — Z1231 Encounter for screening mammogram for malignant neoplasm of breast: Secondary | ICD-10-CM

## 2015-06-11 ENCOUNTER — Ambulatory Visit (HOSPITAL_COMMUNITY)
Admission: RE | Admit: 2015-06-11 | Discharge: 2015-06-11 | Disposition: A | Discharge status: Home / Self Care | Payer: Medicare Other | Source: Ambulatory Visit | Attending: Internal Medicine | Admitting: Internal Medicine

## 2015-06-11 DIAGNOSIS — Z1231 Encounter for screening mammogram for malignant neoplasm of breast: Secondary | ICD-10-CM | POA: Diagnosis present

## 2015-06-17 ENCOUNTER — Encounter (INDEPENDENT_AMBULATORY_CARE_PROVIDER_SITE_OTHER): Payer: Self-pay | Admitting: *Deleted

## 2015-07-02 ENCOUNTER — Encounter: Payer: Self-pay | Admitting: Adult Health

## 2015-07-02 ENCOUNTER — Ambulatory Visit (INDEPENDENT_AMBULATORY_CARE_PROVIDER_SITE_OTHER): Payer: Medicare Other | Admitting: Adult Health

## 2015-07-02 VITALS — BP 140/60 | HR 74 | Ht 61.0 in | Wt 164.5 lb

## 2015-07-02 DIAGNOSIS — Z01419 Encounter for gynecological examination (general) (routine) without abnormal findings: Secondary | ICD-10-CM | POA: Diagnosis not present

## 2015-07-02 DIAGNOSIS — R32 Unspecified urinary incontinence: Secondary | ICD-10-CM

## 2015-07-02 DIAGNOSIS — N3946 Mixed incontinence: Secondary | ICD-10-CM | POA: Diagnosis not present

## 2015-07-02 DIAGNOSIS — Z1211 Encounter for screening for malignant neoplasm of colon: Secondary | ICD-10-CM | POA: Diagnosis not present

## 2015-07-02 HISTORY — DX: Unspecified urinary incontinence: R32

## 2015-07-02 LAB — HEMOCCULT GUIAC POC 1CARD (OFFICE): FECAL OCCULT BLD: NEGATIVE

## 2015-07-02 NOTE — Progress Notes (Signed)
Patient ID: Kelly Cook, female   DOB: 1940/04/06, 75 y.o.   MRN: [id number] History of Present Illness: Kelly Cook is s 75 year old white female, married, G1P1, sp hysterectomy in for a well woman gyn exam. PCP is Wende Neighbors, MD.   Current Medications, Allergies, Past Medical History, Past Surgical History, Family History and Social History were reviewed in [facility] record.     Review of Systems: Patient denies any headaches, hearing loss, fatigue, blurred vision, shortness of breath, chest pain, abdominal pain, problems with bowel movements, she uses metamucil.She is no longer sexually active and has stress and urge incontinence and uses a pad. No joint pain or mood swings.    Physical Exam:BP 140/60 mmHg  Pulse 74  Ht 5\' 1"  (1.549 m)  Wt 164 lb 8 oz (74.617 kg)  BMI 31.10 kg/m2 General:  Well developed, well nourished, no acute distress Skin:  Warm and dry Neck:  Midline trachea, normal thyroid, good ROM, no lymphadenopathy,no carotid bruits heard Lungs; Clear to auscultation bilaterally Breast:  No dominant palpable mass, retraction, or nipple discharge Cardiovascular: Regular rate and rhythm Abdomen:  Soft, non tender, no hepatosplenomegaly Pelvic:  External genitalia is normal in appearance, no lesions.  The vagina is atrophic. Urethra has no lesions or masses. The cervix and uterus are absent.  No adnexal masses or tenderness noted.Bladder is non tender, no masses felt. Rectal: Good sphincter tone, no polyps, internal hemorrhoids felt.  Hemoccult negative. Extremities/musculoskeletal:  No swelling or varicosities noted, no clubbing or cyanosis Psych:  No mood changes, alert and cooperative,seems happy   Impression:  Well woman gyn exam no pap Mixed urinary incontinence    Plan: Physical in 2 years Mammogram yearly Colonoscopy per GI Labs with PCP

## 2015-07-02 NOTE — Patient Instructions (Signed)
Physical in 2 years Mammogram yearly Colonoscopy per GI Labs with PCP

## 2015-08-04 ENCOUNTER — Other Ambulatory Visit (INDEPENDENT_AMBULATORY_CARE_PROVIDER_SITE_OTHER): Payer: Self-pay | Admitting: *Deleted

## 2015-08-04 DIAGNOSIS — Z1211 Encounter for screening for malignant neoplasm of colon: Secondary | ICD-10-CM

## 2015-10-10 ENCOUNTER — Encounter (INDEPENDENT_AMBULATORY_CARE_PROVIDER_SITE_OTHER): Payer: Self-pay | Admitting: *Deleted

## 2015-10-10 ENCOUNTER — Telehealth (INDEPENDENT_AMBULATORY_CARE_PROVIDER_SITE_OTHER): Payer: Self-pay | Admitting: *Deleted

## 2015-10-10 MED ORDER — PEG 3350-KCL-NA BICARB-NACL 420 G PO SOLR: 4000.0000 mL | Freq: Once | ORAL | 0 refills | Status: AC

## 2015-10-10 NOTE — Telephone Encounter (Signed)
Patient needs trilyte 

## 2015-10-30 ENCOUNTER — Telehealth (INDEPENDENT_AMBULATORY_CARE_PROVIDER_SITE_OTHER): Payer: Self-pay | Admitting: *Deleted

## 2015-10-30 NOTE — Telephone Encounter (Signed)
Referring MD/PCP: hall   Procedure: tcs  Reason/Indication:  screening  Has patient had this procedure before?  Yes, 10 yrs ago   If so, when, by whom and where?    Is there a family history of colon cancer?  no  Who?  What age when diagnosed?    Is patient diabetic?   yes      Does patient have prosthetic heart valve or mechanical valve?  no  Do you have a pacemaker?  no  Has patient ever had endocarditis? no  Has patient had joint replacement within last 12 months?  no  Does patient tend to be constipated or take laxatives? yes  Does patient have a history of alcohol/drug use?  no  Is patient on Coumadin, Plavix and/or Aspirin? yes  Medications: asa 81 mg daily, levothyroxine 100 mcg daily, alendronate 70 mg 1 weekly, metformin 500 mg bid, metamucil 1 heaping tsp daily, prenatal vit daily  Allergies: nkda  Medication Adjustment: asa 2 days, hold metformin evening before and morning of  Procedure date & time: 11/27/15 at 730

## 2015-10-30 NOTE — Telephone Encounter (Signed)
agree

## 2015-11-27 ENCOUNTER — Encounter (HOSPITAL_COMMUNITY): Payer: Self-pay | Admitting: *Deleted

## 2015-11-27 ENCOUNTER — Encounter (HOSPITAL_COMMUNITY)
Admission: RE | Disposition: A | Discharge status: Home / Self Care | Payer: Self-pay | Source: Ambulatory Visit | Attending: Internal Medicine

## 2015-11-27 ENCOUNTER — Ambulatory Visit (HOSPITAL_COMMUNITY)
Admission: RE | Admit: 2015-11-27 | Discharge: 2015-11-27 | Disposition: A | Discharge status: Home / Self Care | Payer: Medicare Other | Source: Ambulatory Visit | Attending: Internal Medicine | Admitting: Internal Medicine

## 2015-11-27 DIAGNOSIS — Z1211 Encounter for screening for malignant neoplasm of colon: Secondary | ICD-10-CM | POA: Diagnosis not present

## 2015-11-27 DIAGNOSIS — E119 Type 2 diabetes mellitus without complications: Secondary | ICD-10-CM | POA: Insufficient documentation

## 2015-11-27 DIAGNOSIS — Z7982 Long term (current) use of aspirin: Secondary | ICD-10-CM | POA: Insufficient documentation

## 2015-11-27 DIAGNOSIS — Z7984 Long term (current) use of oral hypoglycemic drugs: Secondary | ICD-10-CM | POA: Diagnosis not present

## 2015-11-27 DIAGNOSIS — E039 Hypothyroidism, unspecified: Secondary | ICD-10-CM | POA: Diagnosis not present

## 2015-11-27 DIAGNOSIS — Z87891 Personal history of nicotine dependence: Secondary | ICD-10-CM | POA: Insufficient documentation

## 2015-11-27 DIAGNOSIS — Z7983 Long term (current) use of bisphosphonates: Secondary | ICD-10-CM | POA: Insufficient documentation

## 2015-11-27 DIAGNOSIS — K644 Residual hemorrhoidal skin tags: Secondary | ICD-10-CM | POA: Insufficient documentation

## 2015-11-27 HISTORY — PX: COLONOSCOPY: SHX5424

## 2015-11-27 LAB — GLUCOSE, CAPILLARY: GLUCOSE-CAPILLARY: 132 mg/dL — AB (ref 65–99)

## 2015-11-27 SURGERY — COLONOSCOPY
Anesthesia: Moderate Sedation

## 2015-11-27 MED ORDER — MEPERIDINE HCL 50 MG/ML IJ SOLN
INTRAMUSCULAR | Status: AC
  Filled 2015-11-27: qty 1

## 2015-11-27 MED ORDER — SODIUM CHLORIDE 0.9 % IV SOLN
INTRAVENOUS | Status: DC
  Administered 2015-11-27: 1000 mL via INTRAVENOUS

## 2015-11-27 MED ORDER — SIMETHICONE 40 MG/0.6ML PO SUSP
ORAL | Status: AC
  Filled 2015-11-27: qty 30

## 2015-11-27 MED ORDER — MIDAZOLAM HCL 5 MG/5ML IJ SOLN
INTRAMUSCULAR | Status: DC | PRN
  Administered 2015-11-27: 1 mg via INTRAVENOUS
  Administered 2015-11-27: 2 mg via INTRAVENOUS
  Administered 2015-11-27: 1 mg via INTRAVENOUS
  Administered 2015-11-27: 2 mg via INTRAVENOUS

## 2015-11-27 MED ORDER — SIMETHICONE 40 MG/0.6ML PO SUSP
ORAL | Status: DC | PRN
  Administered 2015-11-27: 2.5 mL

## 2015-11-27 MED ORDER — MIDAZOLAM HCL 5 MG/5ML IJ SOLN
INTRAMUSCULAR | Status: AC
  Filled 2015-11-27: qty 10

## 2015-11-27 MED ORDER — MEPERIDINE HCL 50 MG/ML IJ SOLN
INTRAMUSCULAR | Status: DC | PRN
  Administered 2015-11-27 (×2): 25 mg via INTRAVENOUS

## 2015-11-27 NOTE — Op Note (Signed)
[facility] Patient Name: Kelly Cook Procedure Date: 11/27/2015 7:34 AM MRN: [id number] Date of Birth: Jun 10, 1940 Attending MD: Hildred Laser , MD CSN: [id number] Age: 75 Admit Type: Outpatient Procedure:                Colonoscopy Indications:              Screening for colorectal malignant neoplasm Providers:                Hildred Laser, MD, Lurline Del, RN, Purcell Nails. [place],                            Technician Referring MD:             Delphina Cahill, MD Medicines:                Meperidine 50 mg IV, Midazolam 6 mg IV Complications:            No immediate complications. Estimated Blood Loss:     Estimated blood loss: none. Procedure:                Pre-Anesthesia Assessment:                           - Prior to the procedure, a History and Physical                            was performed, and patient medications and                            allergies were reviewed. The patient's tolerance of                            previous anesthesia was also reviewed. The risks                            and benefits of the procedure and the sedation                            options and risks were discussed with the patient.                            All questions were answered, and informed consent                            was obtained. Prior Anticoagulants: The patient                            last took aspirin 2 days prior to the procedure.                            ASA Grade Assessment: II - A patient with mild                            systemic disease. After reviewing the risks and  benefits, the patient was deemed in satisfactory                            condition to undergo the procedure.                           After obtaining informed consent, the colonoscope                            was passed under direct vision. Throughout the                            procedure, the patient's blood pressure, pulse, and     oxygen saturations were monitored continuously. The                            [id number]) scope was introduced through                            the anus and advanced to the the cecum, identified                            by appendiceal orifice and ileocecal valve. The                            colonoscopy was performed without difficulty. The                            patient tolerated the procedure well. The quality                            of the bowel preparation was excellent. The                            ileocecal valve, appendiceal orifice, and rectum                            were photographed. Scope In: 7:43:24 AM Scope Out: [id number] AM Scope Withdrawal Time: 0 hours 7 minutes 13 seconds  Total Procedure Duration: 0 hours 22 minutes 49 seconds  Findings:      The colon (entire examined portion) appeared normal.      External hemorrhoids were found during retroflexion. The hemorrhoids       were small. Impression:               - The entire examined colon is normal.                           - External hemorrhoids.                           - No specimens collected. Moderate Sedation:      Moderate (conscious) sedation was administered by the endoscopy nurse       and supervised by the endoscopist. The following parameters were       monitored: oxygen saturation, heart rate,  blood pressure, CO2       capnography and response to care. Total physician intraservice time was       29 minutes. Recommendation:           - Patient has a contact number available for                            emergencies. The signs and symptoms of potential                            delayed complications were discussed with the                            patient. Return to normal activities tomorrow.                            Written discharge instructions were provided to the                            patient.                           - Continue present medications.                            - Resume previous diet.                           - No repeat colonoscopy due to age. Procedure Code(s):        --- Professional ---                           [id number], Colonoscopy, flexible; diagnostic, including                            collection of specimen(s) by brushing or washing,                            when performed (separate procedure)                           99152, Moderate sedation services provided by the                            same physician or other qualified health care                            professional performing the diagnostic or                            therapeutic service that the sedation supports,                            requiring the presence of an independent trained  observer to assist in the monitoring of the                            patient's level of consciousness and physiological                            status; initial 15 minutes of intraservice time,                            patient age 37 years or older                           [phone], Moderate sedation services; each additional                            15 minutes intraservice time Diagnosis Code(s):        --- Professional ---                           Z12.11, Encounter for screening for malignant                            neoplasm of colon                           K64.4, Residual hemorrhoidal skin tags CPT copyright 2016 American Medical Association. All rights reserved. The codes documented in this report are preliminary and upon coder review may  be revised to meet current compliance requirements. Hildred Laser, MD Hildred Laser, MD 11/27/2015 8:15:20 AM This report has been signed electronically. Number of Addenda: 0

## 2015-11-27 NOTE — Discharge Instructions (Signed)
Resume usual medications and diet. No driving for 24 hours.    Colonoscopy, Care After These instructions give you information on caring for yourself after your procedure. Your doctor may also give you more specific instructions. Call your doctor if you have any problems or questions after your procedure. HOME CARE  Do not drive for 24 hours.  Do not sign important papers or use machinery for 24 hours.  You may shower.  You may go back to your usual activities, but go slower for the first 24 hours.  Take rest breaks often during the first 24 hours.  Walk around or use warm packs on your belly (abdomen) if you have belly cramping or gas.  Drink enough fluids to keep your pee (urine) clear or pale yellow.  Resume your normal diet. Avoid heavy or fried foods.  Avoid drinking alcohol for 24 hours or as told by your doctor.  Only take medicines as told by your doctor. If a tissue sample (biopsy) was taken during the procedure:   Do not take aspirin or blood thinners for 7 days, or as told by your doctor.  Do not drink alcohol for 7 days, or as told by your doctor.  Eat soft foods for the first 24 hours. GET HELP IF: You still have a small amount of blood in your poop (stool) 2-3 days after the procedure. GET HELP RIGHT AWAY IF:  You have more than a small amount of blood in your poop.  You see clumps of tissue (blood clots) in your poop.  Your belly is puffy (swollen).  You feel sick to your stomach (nauseous) or throw up (vomit).  You have a fever.  You have belly pain that gets worse and medicine does not help. MAKE SURE YOU:  Understand these instructions.  Will watch your condition.  Will get help right away if you are not doing well or get worse.   This information is not intended to replace advice given to you by your health care provider. Make sure you discuss any questions you have with your health care provider.   Document Released: 02/20/2010 Document  Revised: 01/23/2013 Document Reviewed: 09/25/2012 Elsevier Interactive Patient Education 2016 [employer].  Hemorrhoids Hemorrhoids are swollen veins around the rectum or anus. There are two types of hemorrhoids:   Internal hemorrhoids. These occur in the veins just inside the rectum. They may poke through to the outside and become irritated and painful.  External hemorrhoids. These occur in the veins outside the anus and can be felt as a painful swelling or hard lump near the anus. CAUSES  Pregnancy.   Obesity.   Constipation or diarrhea.   Straining to have a bowel movement.   Sitting for long periods on the toilet.  Heavy lifting or other activity that caused you to strain.  Anal intercourse. SYMPTOMS   Pain.   Anal itching or irritation.   Rectal bleeding.   Fecal leakage.   Anal swelling.   One or more lumps around the anus.  DIAGNOSIS  Your caregiver may be able to diagnose hemorrhoids by visual examination. Other examinations or tests that may be performed include:   Examination of the rectal area with a gloved hand (digital rectal exam).   Examination of anal canal using a small tube (scope).   A blood test if you have lost a significant amount of blood.  A test to look inside the colon (sigmoidoscopy or colonoscopy). TREATMENT Most hemorrhoids can be treated at home. However,  if symptoms do not seem to be getting better or if you have a lot of rectal bleeding, your caregiver may perform a procedure to help make the hemorrhoids get smaller or remove them completely. Possible treatments include:   Placing a rubber band at the base of the hemorrhoid to cut off the circulation (rubber band ligation).   Injecting a chemical to shrink the hemorrhoid (sclerotherapy).   Using a tool to burn the hemorrhoid (infrared light therapy).   Surgically removing the hemorrhoid (hemorrhoidectomy).   Stapling the hemorrhoid to block blood flow to the  tissue (hemorrhoid stapling).  HOME CARE INSTRUCTIONS   Eat foods with fiber, such as whole grains, beans, nuts, fruits, and vegetables. Ask your doctor about taking products with added fiber in them (fibersupplements).  Increase fluid intake. Drink enough water and fluids to keep your urine clear or pale yellow.   Exercise regularly.   Go to the bathroom when you have the urge to have a bowel movement. Do not wait.   Avoid straining to have bowel movements.   Keep the anal area dry and clean. Use wet toilet paper or moist towelettes after a bowel movement.   Medicated creams and suppositories may be used or applied as directed.   Only take over-the-counter or prescription medicines as directed by your caregiver.   Take warm sitz baths for 15-20 minutes, 3-4 times a day to ease pain and discomfort.   Place ice packs on the hemorrhoids if they are tender and swollen. Using ice packs between sitz baths may be helpful.   Put ice in a plastic bag.   Place a towel between your skin and the bag.   Leave the ice on for 15-20 minutes, 3-4 times a day.   Do not use a donut-shaped pillow or sit on the toilet for long periods. This increases blood pooling and pain.  SEEK MEDICAL CARE IF:  You have increasing pain and swelling that is not controlled by treatment or medicine.  You have uncontrolled bleeding.  You have difficulty or you are unable to have a bowel movement.  You have pain or inflammation outside the area of the hemorrhoids. MAKE SURE YOU:  Understand these instructions.  Will watch your condition.  Will get help right away if you are not doing well or get worse.   This information is not intended to replace advice given to you by your health care provider. Make sure you discuss any questions you have with your health care provider.   Document Released: 01/16/2000 Document Revised: 01/05/2012 Document Reviewed: 11/23/2011 Elsevier Interactive Patient  Education [employer].

## 2015-11-27 NOTE — H&P (Signed)
Kelly Cook is an 75 y.o. female.   Chief Complaint: Patient is here for colonoscopy. HPI: Patient is 75 year old Caucasian female who is here for screening colonoscopy. She denies abdominal pain change in bowel habits or frank rectal bleeding. She has history of hemorrhoids and time sees blood on the tissue. Last colonoscopy was 10 years ago. History is negative for CRC.  Past Medical History:  Diagnosis Date  . 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 Constipation 06/16/2012  . Diabetes mellitus   . Gall bladder stones   . Hemorrhoids         06/16/2012  . Hypothyroid 06/16/2012      . Osteoporosis   . Otitis externa 12/01/2012  . Shingles   . Urinary incontinence 07/02/2015    Past Surgical History:  Procedure Laterality Date  . ABDOMINAL HYSTERECTOMY     dysfunctional uterine bleeding  . APPENDECTOMY    . salivary tumor    . TONSILLECTOMY     adenoids  . TUBAL LIGATION      Family History  Problem Relation Age of Onset  . Cancer Father   . Dementia Mother   . Diabetes Brother   . Heart disease Brother   . Mental retardation Daughter     in group home   Social History:  reports that she has quit smoking. Her smoking use included Cigarettes. She quit after 24.00 years of use. She has never used smokeless tobacco. She reports that she does not drink alcohol or use drugs.  Allergies: No Known Allergies  Medications Prior to Admission  Medication Sig Dispense Refill  . alendronate (FOSAMAX) 70 MG tablet take 70mg  once weekly  2  . aspirin 81 MG tablet Take 81 mg by mouth at bedtime.     Marland Kitchen levothyroxine (SYNTHROID, LEVOTHROID) 100 MCG tablet Take 100 mcg by mouth daily.     . metFORMIN (GLUCOPHAGE) 500 MG tablet Take 500 mg by mouth 2 (two) times daily with a meal.      . Prenatal Vit-Fe Fumarate-FA (PRENATAL VITAMIN PO) Take 1 tablet by mouth daily.     . Psyllium (METAMUCIL PO) Take 1 scoop by  mouth daily. Dissolved in liquid and drink.     . 1st Choice Lancets Ultra Thin MISC       Results for orders placed or performed during the hospital encounter of 11/27/15 (from the past 48 hour(s))  Glucose, capillary     Status: Abnormal   Collection Time: 11/27/15  7:22 AM  Result Value Ref Range   Glucose-Capillary 132 (H) 65 - 99 mg/dL   No results found.  ROS  Blood pressure (!) 141/67, pulse 72, temperature 97.8 F (36.6 C), temperature source Oral, resp. rate 16, height 5\' 2"  (1.575 m), weight 159 lb (72.1 kg), SpO2 98 %. Physical Exam  Constitutional: She appears well-developed and well-nourished.  HENT:  Mouth/Throat: Oropharynx is clear and moist.  Eyes: Conjunctivae are normal. No scleral icterus.  Neck: No thyromegaly present.  Cardiovascular: Normal rate, regular rhythm and normal heart sounds.   No murmur heard. Respiratory: Effort normal and breath sounds normal.  GI: Soft. She exhibits no distension and no mass. There is no tenderness.  Musculoskeletal: She exhibits no edema.  Lymphadenopathy:    She has no cervical adenopathy.  Neurological: She is alert.  Skin: Skin is warm and dry.     Assessment/Plan Average risk screening colonoscopy.  Hildred Laser, MD 11/27/2015, 7:33 AM

## 2015-12-01 ENCOUNTER — Encounter (HOSPITAL_COMMUNITY): Payer: Self-pay | Admitting: Internal Medicine

## 2016-01-12 ENCOUNTER — Ambulatory Visit (INDEPENDENT_AMBULATORY_CARE_PROVIDER_SITE_OTHER): Payer: Medicare Other | Admitting: Otolaryngology

## 2016-01-12 DIAGNOSIS — R1312 Dysphagia, oropharyngeal phase: Secondary | ICD-10-CM | POA: Diagnosis not present

## 2016-01-12 DIAGNOSIS — D3709 Neoplasm of uncertain behavior of other specified sites of the oral cavity: Secondary | ICD-10-CM | POA: Diagnosis not present

## 2016-04-29 ENCOUNTER — Other Ambulatory Visit (HOSPITAL_COMMUNITY): Payer: Self-pay | Admitting: Internal Medicine

## 2016-04-29 DIAGNOSIS — Z1231 Encounter for screening mammogram for malignant neoplasm of breast: Secondary | ICD-10-CM

## 2016-06-14 ENCOUNTER — Ambulatory Visit (HOSPITAL_COMMUNITY)
Admission: RE | Admit: 2016-06-14 | Discharge: 2016-06-14 | Disposition: A | Discharge status: Home / Self Care | Payer: Medicare Other | Source: Ambulatory Visit | Attending: Internal Medicine | Admitting: Internal Medicine

## 2016-06-14 DIAGNOSIS — Z1231 Encounter for screening mammogram for malignant neoplasm of breast: Secondary | ICD-10-CM | POA: Diagnosis present

## 2016-10-24 ENCOUNTER — Encounter (HOSPITAL_COMMUNITY): Payer: Self-pay

## 2016-10-24 ENCOUNTER — Emergency Department (HOSPITAL_COMMUNITY)
Admission: EM | Admit: 2016-10-24 | Discharge: 2016-10-24 | Disposition: A | Discharge status: Home / Self Care | Payer: Medicare Other | Attending: Emergency Medicine | Admitting: Emergency Medicine

## 2016-10-24 ENCOUNTER — Emergency Department (HOSPITAL_COMMUNITY): Payer: Medicare Other

## 2016-10-24 DIAGNOSIS — E119 Type 2 diabetes mellitus without complications: Secondary | ICD-10-CM | POA: Insufficient documentation

## 2016-10-24 DIAGNOSIS — R1084 Generalized abdominal pain: Secondary | ICD-10-CM | POA: Diagnosis present

## 2016-10-24 DIAGNOSIS — Z7982 Long term (current) use of aspirin: Secondary | ICD-10-CM | POA: Insufficient documentation

## 2016-10-24 DIAGNOSIS — N200 Calculus of kidney: Secondary | ICD-10-CM | POA: Diagnosis not present

## 2016-10-24 DIAGNOSIS — R319 Hematuria, unspecified: Secondary | ICD-10-CM | POA: Diagnosis not present

## 2016-10-24 DIAGNOSIS — Z87891 Personal history of nicotine dependence: Secondary | ICD-10-CM | POA: Insufficient documentation

## 2016-10-24 DIAGNOSIS — Z7984 Long term (current) use of oral hypoglycemic drugs: Secondary | ICD-10-CM | POA: Insufficient documentation

## 2016-10-24 DIAGNOSIS — Z79899 Other long term (current) drug therapy: Secondary | ICD-10-CM | POA: Insufficient documentation

## 2016-10-24 LAB — COMPREHENSIVE METABOLIC PANEL
ALT: 22 U/L (ref 14–54)
ANION GAP: 7 (ref 5–15)
AST: 17 U/L (ref 15–41)
Albumin: 4.2 g/dL (ref 3.5–5.0)
Alkaline Phosphatase: 36 U/L — ABNORMAL LOW (ref 38–126)
BUN: 14 mg/dL (ref 6–20)
CHLORIDE: 105 mmol/L (ref 101–111)
CO2: 27 mmol/L (ref 22–32)
Calcium: 9.2 mg/dL (ref 8.9–10.3)
Creatinine, Ser: 0.93 mg/dL (ref 0.44–1.00)
GFR, EST NON AFRICAN AMERICAN: 59 mL/min — AB (ref >60)
Glucose, Bld: 150 mg/dL — ABNORMAL HIGH (ref 65–99)
POTASSIUM: 3.7 mmol/L (ref 3.5–5.1)
Sodium: 139 mmol/L (ref 135–145)
Total Bilirubin: 0.8 mg/dL (ref 0.3–1.2)
Total Protein: 7.6 g/dL (ref 6.5–8.1)

## 2016-10-24 LAB — URINALYSIS, ROUTINE W REFLEX MICROSCOPIC
BACTERIA UA: NONE SEEN
BILIRUBIN URINE: NEGATIVE
GLUCOSE, UA: NEGATIVE mg/dL
KETONES UR: NEGATIVE mg/dL
LEUKOCYTES UA: NEGATIVE
NITRITE: NEGATIVE
PROTEIN: 100 mg/dL — AB
SQUAMOUS EPITHELIAL / LPF: NONE SEEN
Specific Gravity, Urine: 1.013 (ref 1.005–1.030)
pH: 5 (ref 5.0–8.0)

## 2016-10-24 LAB — CBC
HEMATOCRIT: 39.5 % (ref 36.0–46.0)
Hemoglobin: 13.5 g/dL (ref 12.0–15.0)
MCH: 33.9 pg (ref 26.0–34.0)
MCHC: 34.2 g/dL (ref 30.0–36.0)
MCV: 99.2 fL (ref 78.0–100.0)
Platelets: 283 10*3/uL (ref 150–400)
RBC: 3.98 MIL/uL (ref 3.87–5.11)
RDW: 14.2 % (ref 11.5–15.5)
WBC: 7.5 10*3/uL (ref 4.0–10.5)

## 2016-10-24 LAB — LIPASE, BLOOD: LIPASE: 34 U/L (ref 11–51)

## 2016-10-24 MED ORDER — ONDANSETRON HCL 4 MG/2ML IJ SOLN
4.0000 mg | Freq: Once | INTRAMUSCULAR | Status: AC
  Administered 2016-10-24: 4 mg via INTRAVENOUS
  Filled 2016-10-24: qty 2

## 2016-10-24 MED ORDER — SODIUM CHLORIDE 0.9 % IV BOLUS (SEPSIS)
500.0000 mL | Freq: Once | INTRAVENOUS | Status: AC
  Administered 2016-10-24: 500 mL via INTRAVENOUS

## 2016-10-24 MED ORDER — IOPAMIDOL (ISOVUE-300) INJECTION 61%
100.0000 mL | Freq: Once | INTRAVENOUS | Status: AC | PRN
  Administered 2016-10-24: 100 mL via INTRAVENOUS

## 2016-10-24 MED ORDER — ONDANSETRON HCL 4 MG PO TABS: 4.0000 mg | ORAL_TABLET | Freq: Four times a day (QID) | ORAL | 0 refills | Status: DC

## 2016-10-24 MED ORDER — OXYCODONE-ACETAMINOPHEN 5-325 MG PO TABS: 1.0000 | ORAL_TABLET | ORAL | 0 refills | Status: DC | PRN

## 2016-10-24 MED ORDER — DICYCLOMINE HCL 20 MG PO TABS: 20.0000 mg | ORAL_TABLET | Freq: Two times a day (BID) | ORAL | 0 refills | Status: DC

## 2016-10-24 NOTE — Discharge Instructions (Signed)
Scan shows a 7 mm kidney stone on the left side. Recommend follow-up with urology. Phone number given. Prescription for pain medicine, nausea medicine, cramping medicine

## 2016-10-24 NOTE — ED Provider Notes (Signed)
[facility] DEPT Provider Note   CSN: [id number] Arrival date & time: 10/24/16  3016     History   Chief Complaint Chief Complaint  Patient presents with  . Abdominal Pain    HPI Kelly Cook is a 76 y.o. female.  Intermittent periumbilical abdominal cramping for 10 days with associated diarrhea. Today patient noted blood in her urine.  No flank pain, dysuria, fever, sweats, chills. No history of hematuria in the past.  Past medical history includes diabetes and hypertension.  Severity of symptoms is moderate.  Nothing makes sxs better or worse.      Past Medical History:  Diagnosis Date  . 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 Constipation 06/16/2012  . Diabetes mellitus   . Gall bladder stones   . Hemorrhoids   . Hyperthyroidism   . Hypothyroid 06/16/2012  . Hypothyroid 06/16/2012  . Osteopenia   . Osteoporosis   . Otitis externa 12/01/2012  . Shingles   . Urinary incontinence 07/02/2015    Patient Active Problem List   Diagnosis Date Noted  . Urinary incontinence 07/02/2015  . 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 Otitis externa 12/01/2012  . Hypothyroid 06/16/2012  . Diabetes ([place]) 06/16/2012  . Osteoporosis 06/16/2012  . Constipation 06/16/2012    Past Surgical History:  Procedure Laterality Date  . ABDOMINAL HYSTERECTOMY     dysfunctional uterine bleeding  . APPENDECTOMY    . 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 TONSILLECTOMY     adenoids  . TUBAL LIGATION      OB History    Gravida Para Term Preterm AB Living   1 1           SAB TAB Ectopic Multiple Live Births                   Home Medications    Prior to Admission medications   Medication Sig Start Date End Date Taking? Authorizing Provider  alendronate (FOSAMAX) 70 MG tablet  take 70mg  once weekly 04/28/15  Yes [provider]  aspirin 81 MG tablet Take 81 mg by mouth at bedtime.    Yes [provider]  levothyroxine (SYNTHROID, LEVOTHROID) 88 MCG tablet Take 1 tablet by mouth at bedtime.  08/26/16  Yes [provider]  metFORMIN (GLUCOPHAGE) 500 MG tablet Take 500 mg by mouth at bedtime.    Yes [provider]  Prenatal Vit-Fe Fumarate-FA (PRENATAL VITAMIN PO) Take 1 tablet by mouth daily.    Yes [provider]  Psyllium (METAMUCIL PO) Take 1 scoop by mouth daily. Dissolved in liquid and drink.    Yes [provider]  1st Choice Lancets Ultra Thin MISC  03/14/12   [provider]  dicyclomine (BENTYL) 20 MG tablet Take 1 tablet (20 mg total) by mouth 2 (two) times daily. PRN cramping 10/24/16   Nat Christen, MD  ondansetron (ZOFRAN) 4 MG tablet Take 1 tablet (4 mg total) by mouth every 6 (six) hours. 10/24/16   Nat Christen, MD  oxyCODONE-acetaminophen (PERCOCET) 5-325 MG tablet Take 1-2 tablets by mouth every 4 (four) hours as needed. 10/24/16   Nat Christen, MD    Family History Family History  Problem Relation Age of Onset  . Cancer Father   . Dementia  Mother   . Diabetes Brother   . Heart disease Brother   . Mental retardation Daughter        in group home    Social History Social History  Substance Use Topics  . Smoking status: Former Smoker    Years: 24.00    Types: Cigarettes  . Smokeless tobacco: Never Used  . Alcohol use No     Allergies   Patient has no known allergies.   Review of Systems Review of Systems  All other systems reviewed and are negative.    Physical Exam Updated Vital Signs BP 128/68   Pulse 72   Temp 97.7 F (36.5 C) (Oral)   Resp 16   Wt 75.8 kg (167 lb)   SpO2 96%   BMI 30.54 kg/m   Physical Exam  Constitutional: She is oriented to person, place, and time. She appears well-developed and well-nourished.  HENT:  Head: Normocephalic and atraumatic.    Eyes: Conjunctivae are normal.  Neck: Neck supple.  Cardiovascular: Normal rate and regular rhythm.   Pulmonary/Chest: Effort normal and breath sounds normal.  Abdominal:  Slight periumbilical tenderness  Genitourinary:  Genitourinary Comments: No flank tenderness  Musculoskeletal: Normal range of motion.  Neurological: She is alert and oriented to person, place, and time.  Skin: Skin is warm and dry.  Psychiatric: She has a normal mood and affect. Her behavior is normal.  Nursing note and vitals reviewed.    ED Treatments / Results  Labs (all labs ordered are listed, but only abnormal results are displayed) Labs Reviewed  COMPREHENSIVE METABOLIC PANEL - Abnormal; Notable for the following:       Result Value   Glucose, Bld 150 (*)    Alkaline Phosphatase 36 (*)    GFR calc non Af Amer 59 (*)    All other components within normal limits  URINALYSIS, ROUTINE W REFLEX MICROSCOPIC - Abnormal; Notable for the following:    Color, Urine BIOCHEMICALS MAY BE AFFECTED BY COLOR (*)    APPearance HAZY (*)    Hgb urine dipstick LARGE (*)    Protein, ur 100 (*)    All other components within normal limits  LIPASE, BLOOD  CBC    EKG  EKG Interpretation None       Radiology Ct Abdomen Pelvis W Contrast  Result Date: 10/24/2016 CLINICAL DATA:  Abdominal pain for several days with hematuria EXAM: CT ABDOMEN AND PELVIS WITH CONTRAST TECHNIQUE: Multidetector CT imaging of the abdomen and pelvis was performed using the standard protocol following bolus administration of intravenous contrast. CONTRAST:  153mL ISOVUE-300 IOPAMIDOL (ISOVUE-300) INJECTION 61% COMPARISON:  None. FINDINGS: Lower chest: No acute abnormality. Hepatobiliary: Diffuse fatty infiltration of the liver is noted. The gallbladder is decompressed with a large lamellated gallstone within. Pancreas: Unremarkable. No pancreatic ductal dilatation or surrounding inflammatory changes. Spleen: Normal in size without focal  abnormality. Adrenals/Urinary Tract: The adrenal glands are within normal limits. Renal cystic changes are noted bilaterally. Mild obstructive changes are noted on the left secondary to a 7 mm proximal left ureteral stone. No calculi are noted on the right. The bladder is partially distended. Stomach/Bowel: The appendix has been surgically removed. No inflammatory or obstructive changes are noted within the bowel. Vascular/Lymphatic: Aortic atherosclerosis. No enlarged abdominal or pelvic lymph nodes. Reproductive: Status post hysterectomy. No adnexal masses. Other: No abdominal wall hernia or abnormality. No abdominopelvic ascites. Musculoskeletal: Mild degenerative changes of lumbar spine are seen. IMPRESSION: 7 mm proximal left ureteral stone with obstructive  change. Electronically Signed   By: Inez Catalina M.D.   On: 10/24/2016 11:26    Procedures Procedures (including critical care time)  Medications Ordered in ED Medications  sodium chloride 0.9 % bolus 500 mL (500 mLs Intravenous New Bag/Given 10/24/16 1040)  ondansetron (ZOFRAN) injection 4 mg (4 mg Intravenous Given 10/24/16 1040)  iopamidol (ISOVUE-300) 61 % injection 100 mL (100 mLs Intravenous Contrast Given 10/24/16 1108)     Initial Impression / Assessment and Plan / ED Course  I have reviewed the triage vital signs and the nursing notes.  Pertinent labs & imaging results that were available during my care of the patient were reviewed by me and considered in my medical decision making (see chart for details).     No acute abdomen on physical exam. CT scan reveals a proximal 7 mm ureteral left-sided stone. This was discussed with the patient and her husband. Follow-up with urology. Discharge medications Percocet, Zofran 4 mg, Bentyl 20 mg.   Final Clinical Impressions(s) / ED Diagnoses   Final diagnoses:  Kidney stone on left side    New Prescriptions New Prescriptions   DICYCLOMINE (BENTYL) 20 MG TABLET    Take 1 tablet (20  mg total) by mouth 2 (two) times daily. PRN cramping   ONDANSETRON (ZOFRAN) 4 MG TABLET    Take 1 tablet (4 mg total) by mouth every 6 (six) hours.   OXYCODONE-ACETAMINOPHEN (PERCOCET) 5-325 MG TABLET    Take 1-2 tablets by mouth every 4 (four) hours as needed.     Nat Christen, MD 10/24/16 1401

## 2016-10-24 NOTE — ED Notes (Signed)
To CT

## 2016-10-24 NOTE — ED Triage Notes (Signed)
Pt reports mid abdominal pain that has been intermittent for approx 10 days. Pt reports blood in urine and loose stools. Reports this began after eating at [facility] Reports blood in urine started this am

## 2016-10-27 ENCOUNTER — Other Ambulatory Visit: Payer: Self-pay | Admitting: Urology

## 2016-10-29 NOTE — H&P (Signed)
Office Visit Report     10/25/2016    Kelly Cook         MRN: [id number]  PRIMARY CARE:  [facility]  DOB: Jun 26, 1940, 76 year old Female  REFERRING:    SSN:   PROVIDER:  Irine Seal, M.D.    LOCATION:  [facility] [phone]    CC: I have ureteral stone.  HPI: Kelly Cook is a 76 year-old female patient female patient who is here for ureteral stone.    Kelly Cook had the onset a week and a half ago after a meal at [employer] she began to have diarrhea that has been intermittent since but she hasn't had any today. She had increased symptoms Saturday evening with severe mid abdominal cramping with nausea but no vomiting. The pain didn't lateralize. She had hematuria Sunday morning. She went to the ER on Sunday and had a CT that showed a 4x7mm left proximal stone but also a very large calcified gallstone. She feels somewhat sick today without much pain. She has no irritative voiding symptoms. She has no prior history of stones.    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   Tobacco Use Assessment Completed:  Used Tobacco in last 30 days?  Drinks 3 caffeinated drinks per day.    Notes: 1 daughter    REVIEW OF SYSTEMS:    GU Review Female:   Patient reports hard to postpone urination and get up at  night to urinate. Patient denies frequent urination, burning /pain with urination, leakage of urine, stream starts and stops, trouble starting your stream, have to strain to urinate, and being pregnant.  Gastrointestinal (Upper):   Patient reports nausea and indigestion/ heartburn. Patient denies vomiting.  Gastrointestinal (Lower):   Patient reports diarrhea. Patient denies constipation.  Constitutional:   Patient denies fever, night sweats, weight loss, and fatigue.  Skin:   Patient denies skin rash/ lesion and itching.  Eyes:   Patient denies blurred vision and double vision.  Ears/ Nose/ Throat:   Patient reports sinus problems. Patient denies sore throat.  Hematologic/Lymphatic:   Patient denies swollen glands and easy bruising.  Cardiovascular:   Patient denies leg swelling and chest pains.  Respiratory:   Patient denies cough and shortness of breath.  Endocrine:   Patient denies excessive thirst.  Musculoskeletal:   Patient denies back pain and joint pain.  Neurological:   Patient denies headaches and dizziness.  Psychologic:   Patient denies depression and anxiety.   VITAL SIGNS:      09 /24/2018 02:27 PM  Weight 167 lb / 75.75 kg  Height 61 in / 154.94 cm  BP 147/77 mmHg  Pulse 70 /min  Temperature 97.8 F / 36.5 C  BMI 31.6 kg/m   MULTI-SYSTEM PHYSICAL EXAMINATION:    Constitutional: Well-nourished. No physical deformities. Normally  developed. Good grooming.  Neck: Neck symmetrical, not swollen. Normal tracheal position.  Respiratory: No labored breathing, no use of accessory muscles. CTA  Cardiovascular: Normal temperature, RRR without murmur. No edema.  Lymphatic: No enlargement, no tenderness of supraclavicular, groin, neck lymph nodes.  Skin: No paleness, no jaundice, no cyanosis. No lesion, no ulcer, no rash.  Neurologic / Psychiatric: Oriented to time, oriented to place, oriented to person. No depression, no anxiety, no agitation.  Gastrointestinal: Obese abdomen. No  hernia. No mass, no tenderness, no rigidity.   Musculoskeletal: Normal gait and station of head and neck.    PAST DATA REVIEWED:  Source Of History:  Patient  Records Review:   Previous Hospital Records  Urine Test Review:   Urinalysis  X-Ray Review: KUB: Reviewed Films. Discussed With Patient.  C.T. Abdomen/Pelvis: Reviewed Films. Reviewed Report. Discussed With Patient.   Notes:                     ER records and labs reviewed.    PROCEDURES:         KUB - [id number]  A single view of the abdomen is obtained. There is a large calcified gallstone as seen on CT and there is a 4 x 44m left proximal stone over the L4 transverse process. No bone, gas or soft tissue abnormalities are noted.       stable left proximal stone.        Urinalysis w/Scope - 81001 Dipstick Dipstick Cont'd Micro  Color: Yellow Bilirubin: Neg WBC/hpf: 0 - 5/hpf  Appearance: Clear Ketones: Neg RBC/hpf: 3 - 10/hpf  Specific Gravity: 1.015 Blood: 3+ Bacteria: NS (Not Seen)  pH: 5.5 Protein: Neg Cystals: NS (Not Seen)  Glucose: Neg Urobilinogen: 0.2 Casts: NS (Not Seen)    Nitrites: Neg Trichomonas: Not Present    Leukocyte Esterase: Neg Mucous: Not Present      Epithelial Cells: 0 - 5/hpf      Yeast: NS (Not Seen)      Sperm: Not Present   Notes:    ASSESSMENT:      ICD-10 Details  1 GU:   Ureteral calculus - N20.1 She has a 4 x 733mradiopaque left proximal ureteral stone. I discussed MET, ESWL and URS and will get her set up for ESWL next week. I reviewed the risks of bleeding, infection, injury to the kidney or adjacent structures, need for secondary procedures, thrombotic events and anesthetic complications.   2 NON-GU:   Cholelithiasis - K80.80 She will discuss this with her GI, Dr. ReLaural Golden   PLAN:          Orders X-Rays: KUB         Schedule Return Visit/Planned Activity: 1 Week - Schedule Surgery             Note: Lithotripsy on 10/1.          Document Letter(s):  Created for Patient: Clinical  Summary        Notes:   CC: Dr. ZaWende Neighborsnd Dr. ReLaural Golden  ** Signed by JoIrine SealM.D. on 10/25/16 at 4:46 PM (EDT)**     The information contained in this medical record document is considered private and confidential patient information. This information can only be used for the medical diagnosis and/or medical services that are being provided by the patient's selected caregivers. This information can only be distributed outside of the patient's care if the patient agrees and signs waivers of authorization for this  information to be sent to an outside source or route.

## 2016-11-01 ENCOUNTER — Ambulatory Visit (HOSPITAL_COMMUNITY)
Admission: RE | Admit: 2016-11-01 | Discharge: 2016-11-01 | Disposition: A | Discharge status: Home / Self Care | Payer: Medicare Other | Source: Ambulatory Visit | Attending: Urology | Admitting: Urology

## 2016-11-01 ENCOUNTER — Ambulatory Visit (HOSPITAL_COMMUNITY): Payer: Medicare Other

## 2016-11-01 ENCOUNTER — Encounter (HOSPITAL_COMMUNITY)
Admission: RE | Disposition: A | Discharge status: Home / Self Care | Payer: Self-pay | Source: Ambulatory Visit | Attending: Urology

## 2016-11-01 ENCOUNTER — Encounter (HOSPITAL_COMMUNITY): Payer: Self-pay | Admitting: *Deleted

## 2016-11-01 DIAGNOSIS — Z79899 Other long term (current) drug therapy: Secondary | ICD-10-CM | POA: Diagnosis not present

## 2016-11-01 DIAGNOSIS — E7800 Pure hypercholesterolemia, unspecified: Secondary | ICD-10-CM | POA: Insufficient documentation

## 2016-11-01 DIAGNOSIS — Z7983 Long term (current) use of bisphosphonates: Secondary | ICD-10-CM | POA: Insufficient documentation

## 2016-11-01 DIAGNOSIS — Z7984 Long term (current) use of oral hypoglycemic drugs: Secondary | ICD-10-CM | POA: Diagnosis not present

## 2016-11-01 DIAGNOSIS — E119 Type 2 diabetes mellitus without complications: Secondary | ICD-10-CM | POA: Diagnosis not present

## 2016-11-01 DIAGNOSIS — K219 Gastro-esophageal reflux disease without esophagitis: Secondary | ICD-10-CM | POA: Insufficient documentation

## 2016-11-01 DIAGNOSIS — Z7982 Long term (current) use of aspirin: Secondary | ICD-10-CM | POA: Insufficient documentation

## 2016-11-01 DIAGNOSIS — M1990 Unspecified osteoarthritis, unspecified site: Secondary | ICD-10-CM | POA: Insufficient documentation

## 2016-11-01 DIAGNOSIS — N201 Calculus of ureter: Secondary | ICD-10-CM | POA: Diagnosis present

## 2016-11-01 DIAGNOSIS — Z87891 Personal history of nicotine dependence: Secondary | ICD-10-CM | POA: Diagnosis not present

## 2016-11-01 HISTORY — DX: Personal history of urinary calculi: Z87.442

## 2016-11-01 HISTORY — PX: EXTRACORPOREAL SHOCK WAVE LITHOTRIPSY: SHX1557

## 2016-11-01 LAB — GLUCOSE, CAPILLARY: Glucose-Capillary: 140 mg/dL — ABNORMAL HIGH (ref 65–99)

## 2016-11-01 SURGERY — LITHOTRIPSY, ESWL
Anesthesia: LOCAL | Laterality: Left

## 2016-11-01 MED ORDER — ASPIRIN 81 MG PO TABS: 81.0000 mg | ORAL_TABLET | Freq: Every day | ORAL | Status: DC

## 2016-11-01 MED ORDER — CIPROFLOXACIN HCL 500 MG PO TABS
500.0000 mg | ORAL_TABLET | ORAL | Status: AC
  Administered 2016-11-01: 500 mg via ORAL
  Filled 2016-11-01: qty 1

## 2016-11-01 MED ORDER — DIAZEPAM 5 MG PO TABS
10.0000 mg | ORAL_TABLET | ORAL | Status: AC
  Administered 2016-11-01: 10 mg via ORAL
  Filled 2016-11-01: qty 2

## 2016-11-01 MED ORDER — HYDROMORPHONE HCL-NACL 0.5-0.9 MG/ML-% IV SOSY: 0.5000 mg | PREFILLED_SYRINGE | Freq: Once | INTRAVENOUS | Status: DC

## 2016-11-01 MED ORDER — DIPHENHYDRAMINE HCL 25 MG PO CAPS
25.0000 mg | ORAL_CAPSULE | ORAL | Status: AC
  Administered 2016-11-01: 25 mg via ORAL
  Filled 2016-11-01: qty 1

## 2016-11-01 MED ORDER — ACETAMINOPHEN 325 MG PO TABS
650.0000 mg | ORAL_TABLET | Freq: Once | ORAL | Status: AC
  Administered 2016-11-01: 650 mg via ORAL
  Filled 2016-11-01: qty 2

## 2016-11-01 MED ORDER — SODIUM CHLORIDE 0.9 % IV SOLN
INTRAVENOUS | Status: DC
  Administered 2016-11-01: 08:00:00 via INTRAVENOUS
  Administered 2016-11-01: 250 mL via INTRAVENOUS

## 2016-11-01 MED ORDER — HYDROMORPHONE HCL 1 MG/ML IJ SOLN
0.5000 mg | Freq: Once | INTRAMUSCULAR | Status: AC
  Administered 2016-11-01: 0.5 mg via INTRAVENOUS
  Filled 2016-11-01: qty 1

## 2016-11-01 NOTE — Op Note (Signed)
Left ESWL   Left distal ureteral stone -- 4 x 7 mm   Findings: Pt tolerated well. Stone smudged. She may need a staged procedure.

## 2016-11-01 NOTE — Discharge Instructions (Signed)
Lithotripsy, Care After °This sheet gives you information about how to care for yourself after your procedure. Your health care provider may also give you more specific instructions. If you have problems or questions, contact your health care provider. °What can I expect after the procedure? °After the procedure, it is common to have: °· Some blood in your urine. This should only last for a few days. °· Soreness in your back, sides, or upper abdomen for a few days. °· Blotches or bruises on your back where the pressure wave entered the skin. °· Pain, discomfort, or nausea when pieces (fragments) of the kidney stone move through the tube that carries urine from the kidney to the bladder (ureter). Stone fragments may pass soon after the procedure, but they may continue to pass for up to 4-8 weeks. °? If you have severe pain or nausea, contact your health care provider. This may be caused by a large stone that was not broken up, and this may mean that you need more treatment. °· Some pain or discomfort during urination. °· Some pain or discomfort in the lower abdomen or (in men) at the base of the penis. ° °Follow these instructions at home: °Medicines °· Take over-the-counter and prescription medicines only as told by your health care provider. °· If you were prescribed an antibiotic medicine, take it as told by your health care provider. Do not stop taking the antibiotic even if you start to feel better. °· Do not drive for 24 hours if you were given a medicine to help you relax (sedative). °· Do not drive or use heavy machinery while taking prescription pain medicine. °Eating and drinking °· Drink enough water and fluids to keep your urine clear or pale yellow. This helps any remaining pieces of the stone to pass. It can also help prevent new stones from forming. °· Eat plenty of fresh fruits and vegetables. °· Follow instructions from your health care provider about eating and drinking restrictions. You may be  instructed: °? To reduce how much salt (sodium) you eat or drink. Check ingredients and nutrition facts on packaged foods and beverages. °? To reduce how much meat you eat. °· Eat the recommended amount of calcium for your age and gender. Ask your health care provider how much calcium you should have. °General instructions °· Get plenty of rest. °· Most people can resume normal activities 1-2 days after the procedure. Ask your health care provider what activities are safe for you. °· If directed, strain all urine through the strainer that was provided by your health care provider. °? Keep all fragments for your health care provider to see. Any stones that are found may be sent to a medical lab for examination. The stone may be as small as a grain of salt. °· Keep all follow-up visits as told by your health care provider. This is important. °Contact a health care provider if: °· You have pain that is severe or does not get better with medicine. °· You have nausea that is severe or does not go away. °· You have blood in your urine longer than your health care provider told you to expect. °· You have more blood in your urine. °· You have pain during urination that does not go away. °· You urinate more frequently than usual and this does not go away. °· You develop a rash or any other possible signs of an allergic reaction. °Get help right away if: °· You have severe pain in   your back, sides, or upper abdomen. °· You have severe pain while urinating. °· Your urine is very dark red. °· You have blood in your stool (feces). °· You cannot pass any urine at all. °· You feel a strong urge to urinate after emptying your bladder. °· You have a fever or chills. °· You develop shortness of breath, difficulty breathing, or chest pain. °· You have severe nausea that leads to persistent vomiting. °· You faint. °Summary °· After this procedure, it is common to have some pain, discomfort, or nausea when pieces (fragments) of the  kidney stone move through the tube that carries urine from the kidney to the bladder (ureter). If this pain or nausea is severe, however, you should contact your health care provider. °· Most people can resume normal activities 1-2 days after the procedure. Ask your health care provider what activities are safe for you. °· Drink enough water and fluids to keep your urine clear or pale yellow. This helps any remaining pieces of the stone to pass, and it can help prevent new stones from forming. °· If directed, strain your urine and keep all fragments for your health care provider to see. Fragments or stones may be as small as a grain of salt. °· Get help right away if you have severe pain in your back, sides, or upper abdomen or have severe pain while urinating. °This information is not intended to replace advice given to you by your health care provider. Make sure you discuss any questions you have with your health care provider. °Document Released: 02/07/2007 Document Revised: 12/10/2015 Document Reviewed: 12/10/2015 °Elsevier Interactive Patient Education © 2017 Elsevier Inc. ° °

## 2016-11-01 NOTE — Progress Notes (Signed)
11:45 am pt vomited times 1; cold cloth applied to forehead and pt offered antinausea medication; pt stated she felt ok and did not want any further medication at this time; pt discharged home with emesis basin and cold cloth.

## 2016-11-01 NOTE — Interval H&P Note (Signed)
History and Physical Interval Note:  11/01/2016 8:53 AM  Kelly Cook  has presented today for surgery, with the diagnosis of LEFT URETERAL STONE  The various methods of treatment have been discussed with the patient and family. After consideration of risks, benefits and other options for treatment, the patient has consented to  Procedure(s): LEFT EXTRACORPOREAL SHOCK WAVE LITHOTRIPSY (ESWL) (Left) as a surgical intervention .  The patient's history has been reviewed, patient examined, no change in status, stable for surgery.  I have reviewed the patient's chart and labs. KUB with stone in left distal ureter. She's well today. No fever.   Questions were answered to the patient's satisfaction.     Pedram Goodchild

## 2017-05-09 ENCOUNTER — Other Ambulatory Visit (HOSPITAL_COMMUNITY): Payer: Self-pay | Admitting: Internal Medicine

## 2017-05-09 DIAGNOSIS — Z1231 Encounter for screening mammogram for malignant neoplasm of breast: Secondary | ICD-10-CM

## 2017-05-27 ENCOUNTER — Other Ambulatory Visit (HOSPITAL_COMMUNITY): Payer: Self-pay | Admitting: Internal Medicine

## 2017-05-27 DIAGNOSIS — D110 Benign neoplasm of parotid gland: Secondary | ICD-10-CM

## 2017-05-27 DIAGNOSIS — R609 Edema, unspecified: Secondary | ICD-10-CM

## 2017-05-31 ENCOUNTER — Ambulatory Visit (HOSPITAL_COMMUNITY)
Admission: RE | Admit: 2017-05-31 | Discharge: 2017-05-31 | Disposition: A | Discharge status: Home / Self Care | Payer: Medicare Other | Source: Ambulatory Visit | Attending: Internal Medicine | Admitting: Internal Medicine

## 2017-05-31 ENCOUNTER — Other Ambulatory Visit (HOSPITAL_COMMUNITY): Payer: Self-pay | Admitting: Internal Medicine

## 2017-05-31 DIAGNOSIS — D110 Benign neoplasm of parotid gland: Secondary | ICD-10-CM

## 2017-05-31 DIAGNOSIS — R609 Edema, unspecified: Secondary | ICD-10-CM

## 2017-05-31 DIAGNOSIS — Z9889 Other specified postprocedural states: Secondary | ICD-10-CM | POA: Insufficient documentation

## 2017-05-31 LAB — POCT I-STAT CREATININE: CREATININE: 1 mg/dL (ref 0.44–1.00)

## 2017-05-31 MED ORDER — BARIUM SULFATE 2.1 % PO SUSP
ORAL | Status: AC
  Filled 2017-05-31: qty 2

## 2017-05-31 MED ORDER — IOPAMIDOL (ISOVUE-300) INJECTION 61%
75.0000 mL | Freq: Once | INTRAVENOUS | Status: AC | PRN
  Administered 2017-05-31: 75 mL via INTRAVENOUS

## 2017-06-20 ENCOUNTER — Encounter (HOSPITAL_COMMUNITY): Payer: Self-pay

## 2017-06-20 ENCOUNTER — Ambulatory Visit (HOSPITAL_COMMUNITY)
Admission: RE | Admit: 2017-06-20 | Discharge: 2017-06-20 | Disposition: A | Discharge status: Home / Self Care | Payer: Medicare Other | Source: Ambulatory Visit | Attending: Internal Medicine | Admitting: Internal Medicine

## 2017-06-20 DIAGNOSIS — Z1231 Encounter for screening mammogram for malignant neoplasm of breast: Secondary | ICD-10-CM | POA: Insufficient documentation

## 2017-07-06 ENCOUNTER — Ambulatory Visit: Payer: Medicare Other | Admitting: Adult Health

## 2017-07-06 ENCOUNTER — Encounter: Payer: Self-pay | Admitting: Adult Health

## 2017-07-06 VITALS — BP 113/61 | HR 67 | Ht 61.0 in | Wt 161.0 lb

## 2017-07-06 DIAGNOSIS — Z1212 Encounter for screening for malignant neoplasm of rectum: Secondary | ICD-10-CM

## 2017-07-06 DIAGNOSIS — Z01411 Encounter for gynecological examination (general) (routine) with abnormal findings: Secondary | ICD-10-CM | POA: Diagnosis not present

## 2017-07-06 DIAGNOSIS — Z01419 Encounter for gynecological examination (general) (routine) without abnormal findings: Secondary | ICD-10-CM | POA: Insufficient documentation

## 2017-07-06 DIAGNOSIS — Z1211 Encounter for screening for malignant neoplasm of colon: Secondary | ICD-10-CM | POA: Insufficient documentation

## 2017-07-06 DIAGNOSIS — K649 Unspecified hemorrhoids: Secondary | ICD-10-CM

## 2017-07-06 LAB — HEMOCCULT GUIAC POC 1CARD (OFFICE): Fecal Occult Blood, POC: NEGATIVE

## 2017-07-06 NOTE — Progress Notes (Signed)
Patient ID: Kelly Cook, female   DOB: 11-30-1940, 77 y.o.   MRN: [id number] History of Present Illness: Kelly Cook is a 77 year old white female, married, sp hysterectomy in for well woman gyn exam. PCP is [facility].   Current Medications, Allergies, Past Medical History, Past Surgical History, Family History and Social History were reviewed in [employer] record.     Review of Systems:  Patient denies any headaches, hearing loss, fatigue, blurred vision, shortness of breath, chest pain, abdominal pain, problems with bowel movements(has had diarrhea with store brand metamucil), urination, or intercourse(not having sex). No joint pain or mood swings.Has ache in legs below the knee when line dances.   Physical Exam:BP 113/61 (BP Location: Left Arm, Patient Position: Sitting, Cuff Size: Small)   Pulse 67   Ht 5\' 1"  (1.549 m)   Wt 161 lb (73 kg)   BMI 30.42 kg/m  General:  Well developed, well nourished, no acute distress Skin:  Warm and dry Neck:  Midline trachea, normal thyroid, good ROM, no lymphadenopathy,no carotid bruits heard Lungs; Clear to auscultation bilaterally Breast:  No dominant palpable mass, retraction, or nipple discharge Cardiovascular: Regular rate and rhythm Abdomen:  Soft, non tender, no hepatosplenomegaly,tick bite left side. Pelvic:  External genitalia is normal in appearance, no lesions.  The vagina is normal in appearance for age, with loss of color,moisture and rugae. Urethra has small caruncle. The cervix and uterus are absent.  No adnexal masses or tenderness noted.Bladder is non tender, no masses felt. Rectal: Good sphincter tone, no polyps, + hemorrhoids felt.  Hemoccult negative. Extremities/musculoskeletal:  No swelling,+ varicosities noted, no clubbing or cyanosis Psych:  No mood changes, alert and cooperative,seems happy PHQ 9 score 1.  Impression:  1. Encounter for well woman exam with routine gynecological exam   2. Screening  for colorectal cancer      Plan: Try support knee highs Physical in 2 years if desired Labs with PCP Mammogram every 1-2 years

## 2017-10-06 ENCOUNTER — Ambulatory Visit (INDEPENDENT_AMBULATORY_CARE_PROVIDER_SITE_OTHER): Payer: Medicare Other | Admitting: Otolaryngology

## 2017-10-06 DIAGNOSIS — R040 Epistaxis: Secondary | ICD-10-CM | POA: Diagnosis not present

## 2017-11-07 ENCOUNTER — Other Ambulatory Visit (HOSPITAL_COMMUNITY): Payer: Medicare Other

## 2017-11-07 NOTE — Patient Instructions (Signed)
Your procedure is scheduled on: 11/14/17               Report to [facility] at    10:00 AM.  Call this number if you have problems the morning of surgery: [phone]   Remember:   Do not eat or drink :After Midnight.    Take these medicines the morning of surgery with A SIP OF WATER:  Levothyroxine          Do not wear jewelry, make-up or nail polish.  Do not wear lotions, powders, or perfumes. You may wear deodorant.  Do not bring valuables to the hospital.  Contacts, dentures or bridgework may not be worn into surgery.  Patients discharged the day of surgery will not be allowed to drive home.  Name and phone number of your driver:    @[email or web address] @ Cataract Surgery  A cataract is a clouding of the lens of the eye. When a lens becomes cloudy, vision is reduced based on the degree and nature of the clouding. Surgery may be needed to improve vision. Surgery removes the cloudy lens and usually replaces it with a substitute lens (intraocular lens, IOL). LET YOUR EYE DOCTOR KNOW ABOUT:  Allergies to food or medicine.   Medicines taken including herbs, eyedrops, over-the-counter medicines, and creams.   Use of steroids (by mouth or creams).   Previous problems with anesthetics or numbing medicine.   History of bleeding problems or blood clots.   Previous surgery.   Other health problems, including diabetes and kidney problems.   Possibility of pregnancy, if this applies.  RISKS AND COMPLICATIONS  Infection.   Inflammation of the eyeball (endophthalmitis) that can spread to both eyes (sympathetic ophthalmia).   Poor wound healing.   If an IOL is inserted, it can later fall out of proper position. This is very uncommon.   Clouding of the part of your eye that holds an IOL in place. This is called an "after-cataract." These are uncommon, but easily treated.  BEFORE THE PROCEDURE  Do not eat or drink anything except small amounts of water for 8 to 12 before your  surgery, or as directed by your caregiver.   Unless you are told otherwise, continue any eyedrops you have been prescribed.   Talk to your primary caregiver about all other medicines that you take (both prescription and non-prescription). In some cases, you may need to stop or change medicines near the time of your surgery. This is most important if you are taking blood-thinning medicine.Do not stop medicines unless you are told to do so.   Arrange for someone to drive you to and from the procedure.   Do not put contact lenses in either eye on the day of your surgery.  PROCEDURE There is more than one method for safely removing a cataract. Your doctor can explain the differences and help determine which is best for you. Phacoemulsification surgery is the most common form of cataract surgery.  An injection is given behind the eye or eyedrops are given to make this a painless procedure.   A small cut (incision) is made on the edge of the clear, dome-shaped surface that covers the front of the eye (cornea).   A tiny probe is painlessly inserted into the eye. This device gives off ultrasound waves that soften and break up the cloudy center of the lens. This makes it easier for the cloudy lens to be removed by suction.   An IOL may be implanted.  The normal lens of the eye is covered by a clear capsule. Part of that capsule is intentionally left in the eye to support the IOL.   Your surgeon may or may not use stitches to close the incision.  There are other forms of cataract surgery that require a larger incision and stiches to close the eye. This approach is taken in cases where the doctor feels that the cataract cannot be easily removed using phacoemulsification. AFTER THE PROCEDURE  When an IOL is implanted, it does not need care. It becomes a permanent part of your eye and cannot be seen or felt.   Your doctor will schedule follow-up exams to check on your progress.   Review your  other medicines with your doctor to see which can be resumed after surgery.   Use eyedrops or take medicine as prescribed by your doctor.  Document Released: 01/07/2011 Document Reviewed: 01/04/2011 [facility] Patient Information 2012 [place].  .Cataract Surgery Care After Refer to this sheet in the next few weeks. These instructions provide you with information on caring for yourself after your procedure. Your caregiver may also give you more specific instructions. Your treatment has been planned according to current medical practices, but problems sometimes occur. Call your caregiver if you have any problems or questions after your procedure.  HOME CARE INSTRUCTIONS   Avoid strenuous activities as directed by your caregiver.   Ask your caregiver when you can resume driving.   Use eyedrops or other medicines to help healing and control pressure inside your eye as directed by your caregiver.   Only take over-the-counter or prescription medicines for pain, discomfort, or fever as directed by your caregiver.   Do not to touch or rub your eyes.   You may be instructed to use a protective shield during the first few days and nights after surgery. If not, wear sunglasses to protect your eyes. This is to protect the eye from pressure or from being accidentally bumped.   Keep the area around your eye clean and dry. Avoid swimming or allowing water to hit you directly in the face while showering. Keep soap and shampoo out of your eyes.   Do not bend or lift heavy objects. Bending increases pressure in the eye. You can walk, climb stairs, and do light household chores.   Do not put a contact lens into the eye that had surgery until your caregiver says it is okay to do so.   Ask your doctor when you can return to work. This will depend on the kind of work that you do. If you work in a dusty environment, you may be advised to wear protective eyewear for a period of time.   Ask your  caregiver when it will be safe to engage in sexual activity.   Continue with your regular eye exams as directed by your caregiver.  What to expect:  It is normal to feel itching and mild discomfort for a few days after cataract surgery. Some fluid discharge is also common, and your eye may be sensitive to light and touch.   After 1 to 2 days, even moderate discomfort should disappear. In most cases, healing will take about 6 weeks.   If you received an intraocular lens (IOL), you may notice that colors are very bright or have a blue tinge. Also, if you have been in bright sunlight, everything may appear reddish for a few hours. If you see these color tinges, it is because your lens is  clear and no longer cloudy. Within a few months after receiving an IOL, these extra colors should go away. When you have healed, you will probably need new glasses.  SEEK MEDICAL CARE IF:   You have increased bruising around your eye.   You have discomfort not helped by medicine.  SEEK IMMEDIATE MEDICAL CARE IF:   You have a fever.   You have a worsening or sudden vision loss.   You have redness, swelling, or increasing pain in the eye.   You have a thick discharge from the eye that had surgery.  MAKE SURE YOU:  Understand these instructions.   Will watch your condition.   Will get help right away if you are not doing well or get worse.  Document Released: 08/07/2004 Document Revised: 01/07/2011 Document Reviewed: 09/11/2010 [facility] Patient Information 2012 [place].    Monitored Anesthesia Care  Monitored anesthesia care is an anesthesia service for a medical procedure. Anesthesia is the loss of the ability to feel pain. It is produced by medications called anesthetics. It may affect a small area of your body (local anesthesia), a large area of your body (regional anesthesia), or your entire body (general anesthesia). The need for monitored anesthesia care depends your procedure, your  condition, and the potential need for regional or general anesthesia. It is often provided during procedures where:   General anesthesia may be needed if there are complications. This is because you need special care when you are under general anesthesia.   You will be under local or regional anesthesia. This is so that you are able to have higher levels of anesthesia if needed.   You will receive calming medications (sedatives). This is especially the case if sedatives are given to put you in a semi-conscious state of relaxation (deep sedation). This is because the amount of sedative needed to produce this state can be hard to predict. Too much of a sedative can produce general anesthesia. Monitored anesthesia care is performed by one or more caregivers who have special training in all types of anesthesia. You will need to meet with these caregivers before your procedure. During this meeting, they will ask you about your medical history. They will also give you instructions to follow. (For example, you will need to stop eating and drinking before your procedure. You may also need to stop or change medications you are taking.) During your procedure, your caregivers will stay with you. They will:   Watch your condition. This includes watching you blood pressure, breathing, and level of pain.   Diagnose and treat problems that occur.   Give medications if they are needed. These may include calming medications (sedatives) and anesthetics.   Make sure you are comfortable.  Having monitored anesthesia care does not necessarily mean that you will be under anesthesia. It does mean that your caregivers will be able to manage anesthesia if you need it or if it occurs. It also means that you will be able to have a different type of anesthesia than you are having if you need it. When your procedure is complete, your caregivers will continue to watch your condition. They will make sure any medications wear  off before you are allowed to go home.  Document Released: 10/14/2004 Document Revised: 05/15/2012 Document Reviewed: 03/01/2012 [facility] Patient Information 2014 [place], [place].

## 2017-11-09 ENCOUNTER — Encounter (HOSPITAL_COMMUNITY)
Admission: RE | Admit: 2017-11-09 | Discharge: 2017-11-09 | Disposition: A | Discharge status: Home / Self Care | Payer: Medicare Other | Source: Ambulatory Visit | Attending: Ophthalmology | Admitting: Ophthalmology

## 2017-11-09 ENCOUNTER — Other Ambulatory Visit: Payer: Self-pay

## 2017-11-09 ENCOUNTER — Encounter (HOSPITAL_COMMUNITY): Payer: Self-pay

## 2017-11-09 DIAGNOSIS — H2512 Age-related nuclear cataract, left eye: Secondary | ICD-10-CM | POA: Insufficient documentation

## 2017-11-09 DIAGNOSIS — R9431 Abnormal electrocardiogram [ECG] [EKG]: Secondary | ICD-10-CM | POA: Diagnosis not present

## 2017-11-09 DIAGNOSIS — Z01818 Encounter for other preprocedural examination: Secondary | ICD-10-CM | POA: Insufficient documentation

## 2017-11-14 ENCOUNTER — Ambulatory Visit (HOSPITAL_COMMUNITY): Payer: Medicare Other | Admitting: Anesthesiology

## 2017-11-14 ENCOUNTER — Ambulatory Visit (HOSPITAL_COMMUNITY)
Admission: RE | Admit: 2017-11-14 | Discharge: 2017-11-14 | Disposition: A | Discharge status: Home / Self Care | Payer: Medicare Other | Source: Ambulatory Visit | Attending: Ophthalmology | Admitting: Ophthalmology

## 2017-11-14 ENCOUNTER — Encounter (HOSPITAL_COMMUNITY)
Admission: RE | Disposition: A | Discharge status: Home / Self Care | Payer: Self-pay | Source: Ambulatory Visit | Attending: Ophthalmology

## 2017-11-14 ENCOUNTER — Other Ambulatory Visit: Payer: Self-pay

## 2017-11-14 ENCOUNTER — Encounter (HOSPITAL_COMMUNITY): Payer: Self-pay | Admitting: *Deleted

## 2017-11-14 DIAGNOSIS — Z87891 Personal history of nicotine dependence: Secondary | ICD-10-CM | POA: Diagnosis not present

## 2017-11-14 DIAGNOSIS — H2512 Age-related nuclear cataract, left eye: Secondary | ICD-10-CM | POA: Diagnosis not present

## 2017-11-14 DIAGNOSIS — E1136 Type 2 diabetes mellitus with diabetic cataract: Secondary | ICD-10-CM | POA: Insufficient documentation

## 2017-11-14 DIAGNOSIS — Z79890 Hormone replacement therapy (postmenopausal): Secondary | ICD-10-CM | POA: Diagnosis not present

## 2017-11-14 DIAGNOSIS — Z7984 Long term (current) use of oral hypoglycemic drugs: Secondary | ICD-10-CM | POA: Diagnosis not present

## 2017-11-14 DIAGNOSIS — E039 Hypothyroidism, unspecified: Secondary | ICD-10-CM | POA: Diagnosis not present

## 2017-11-14 HISTORY — PX: CATARACT EXTRACTION W/PHACO: SHX586

## 2017-11-14 LAB — GLUCOSE, CAPILLARY: Glucose-Capillary: 100 mg/dL — ABNORMAL HIGH (ref 70–99)

## 2017-11-14 SURGERY — PHACOEMULSIFICATION, CATARACT, WITH IOL INSERTION
Anesthesia: Monitor Anesthesia Care | Site: Eye | Laterality: Left

## 2017-11-14 MED ORDER — MIDAZOLAM HCL 2 MG/2ML IJ SOLN
INTRAMUSCULAR | Status: AC
  Filled 2017-11-14: qty 2

## 2017-11-14 MED ORDER — NEOMYCIN-POLYMYXIN-DEXAMETH 3.5-10000-0.1 OP SUSP
OPHTHALMIC | Status: DC | PRN
  Administered 2017-11-14: 2 [drp] via OPHTHALMIC

## 2017-11-14 MED ORDER — EPINEPHRINE PF 1 MG/ML IJ SOLN
INTRAOCULAR | Status: DC | PRN
  Administered 2017-11-14: 500 mL

## 2017-11-14 MED ORDER — POVIDONE-IODINE 5 % OP SOLN
OPHTHALMIC | Status: DC | PRN
  Administered 2017-11-14: 1 via OPHTHALMIC

## 2017-11-14 MED ORDER — CYCLOPENTOLATE-PHENYLEPHRINE 0.2-1 % OP SOLN
1.0000 [drp] | OPHTHALMIC | Status: AC
  Administered 2017-11-14 (×3): 1 [drp] via OPHTHALMIC

## 2017-11-14 MED ORDER — MIDAZOLAM HCL 5 MG/5ML IJ SOLN
INTRAMUSCULAR | Status: DC | PRN
  Administered 2017-11-14: 1 mg via INTRAVENOUS

## 2017-11-14 MED ORDER — LIDOCAINE HCL 3.5 % OP GEL
1.0000 "application " | Freq: Once | OPHTHALMIC | Status: AC
  Administered 2017-11-14: 1 via OPHTHALMIC

## 2017-11-14 MED ORDER — PHENYLEPHRINE HCL 2.5 % OP SOLN
1.0000 [drp] | OPHTHALMIC | Status: AC
  Administered 2017-11-14 (×3): 1 [drp] via OPHTHALMIC

## 2017-11-14 MED ORDER — TETRACAINE HCL 0.5 % OP SOLN
1.0000 [drp] | OPHTHALMIC | Status: AC
  Administered 2017-11-14 (×3): 1 [drp] via OPHTHALMIC

## 2017-11-14 MED ORDER — LIDOCAINE HCL (PF) 1 % IJ SOLN
INTRAMUSCULAR | Status: DC | PRN
  Administered 2017-11-14: .8 mL

## 2017-11-14 MED ORDER — BSS IO SOLN
INTRAOCULAR | Status: DC | PRN
  Administered 2017-11-14: 15 mL via INTRAOCULAR

## 2017-11-14 MED ORDER — LACTATED RINGERS IV SOLN
INTRAVENOUS | Status: DC
  Administered 2017-11-14: 11:00:00 via INTRAVENOUS

## 2017-11-14 MED ORDER — EPINEPHRINE PF 1 MG/ML IJ SOLN
INTRAMUSCULAR | Status: AC
  Filled 2017-11-14: qty 1

## 2017-11-14 MED ORDER — PROVISC 10 MG/ML IO SOLN
INTRAOCULAR | Status: DC | PRN
  Administered 2017-11-14: 0.85 mL via INTRAOCULAR

## 2017-11-14 SURGICAL SUPPLY — 12 items
CLOTH BEACON ORANGE TIMEOUT ST (SAFETY) ×2 IMPLANT
EYE SHIELD UNIVERSAL CLEAR (GAUZE/BANDAGES/DRESSINGS) ×2 IMPLANT
GLOVE BIOGEL PI IND STRL 7.0 (GLOVE) IMPLANT
GLOVE BIOGEL PI INDICATOR 7.0 (GLOVE) ×4
LENS ALC ACRYL/TECN (Ophthalmic Related) ×2 IMPLANT
NDL HYPO 18GX1.5 BLUNT FILL (NEEDLE) IMPLANT
NEEDLE HYPO 18GX1.5 BLUNT FILL (NEEDLE) ×3 IMPLANT
PAD ARMBOARD 7.5X6 YLW CONV (MISCELLANEOUS) ×2 IMPLANT
SYRINGE LUER LOK 1CC (MISCELLANEOUS) ×2 IMPLANT
TAPE SURG TRANSPORE 1 IN (GAUZE/BANDAGES/DRESSINGS) IMPLANT
TAPE SURGICAL TRANSPORE 1 IN (GAUZE/BANDAGES/DRESSINGS) ×2
WATER STERILE IRR 250ML POUR (IV SOLUTION) ×2 IMPLANT

## 2017-11-14 NOTE — Op Note (Signed)
Date of Admission: 11/14/2017  Date of Surgery: 11/14/2017  Pre-Op Dx: Cataract Left  Eye  Post-Op Dx: Senile Nuclear Cataract  Left  Eye,  Dx Code H25.12  Surgeon: Tonny Branch, M.D.  Assistants: None  Anesthesia: Topical with MAC  Indications: Painless, progressive loss of vision with compromise of daily activities.  Surgery: Cataract Extraction with Intraocular lens Implant Left Eye  Discription: The patient had dilating drops and viscous lidocaine placed into the Left eye in the pre-op holding area. After transfer to the operating room, a time out was performed. The patient was then prepped and draped. Beginning with a 50m blade a paracentesis port was made at the surgeon's 2 o'clock position. The anterior chamber was then filled with 1% non-preserved lidocaine. This was followed by filling the anterior chamber with Provisc.  A 2.466mkeratome blade was used to make a clear corneal incision at the temporal limbus.  A bent cystatome needle was used to create a continuous tear capsulotomy. Hydrodissection was performed with balanced salt solution on a Fine canula. The lens nucleus was then removed using the phacoemulsification handpiece. Residual cortex was removed with the I&A handpiece. The anterior chamber and capsular bag were refilled with Provisc. A posterior chamber intraocular lens was placed into the capsular bag with it's injector. The implant was positioned with the Kuglan hook. The Provisc was then removed from the anterior chamber and capsular bag with the I&A handpiece. Stromal hydration of the main incision and paracentesis port was performed with BSS on a Fine canula. The wounds were tested for leak which was negative. The patient tolerated the procedure well. There were no operative complications. The patient was then transferred to the recovery room in stable condition.  Complications: None  Specimen: None  EBL: None  Prosthetic device: J&J Technis, PCB00, power 24.5, SN  [id number]

## 2017-11-14 NOTE — H&P (Signed)
I have reviewed the H&P, the patient was re-examined, and I have identified no interval changes in medical condition and plan of care since the history and physical of record  

## 2017-11-14 NOTE — Anesthesia Postprocedure Evaluation (Signed)
Anesthesia Post Note  Patient: Santa Lighter  Procedure(s) Performed: CATARACT EXTRACTION PHACO AND INTRAOCULAR LENS PLACEMENT (IOC) (Left Eye)  Patient location during evaluation: Short Stay Anesthesia Type: MAC Level of consciousness: awake and alert and oriented Pain management: pain level controlled Vital Signs Assessment: post-procedure vital signs reviewed and stable Respiratory status: spontaneous breathing Cardiovascular status: blood pressure returned to baseline and stable Postop Assessment: no apparent nausea or vomiting Anesthetic complications: no     Last Vitals:  Vitals:   11/14/17 1043  BP: (!) 153/76  Pulse: 61  Resp: 12  Temp: 36.5 C  SpO2: 98%    Last Pain:  Vitals:   11/14/17 1043  TempSrc: Oral  PainSc: 0-No pain                 Natassja Ollis

## 2017-11-14 NOTE — Discharge Instructions (Signed)

## 2017-11-14 NOTE — Transfer of Care (Signed)
Immediate Anesthesia Transfer of Care Note  Patient: Kelly Cook  Procedure(s) Performed: CATARACT EXTRACTION PHACO AND INTRAOCULAR LENS PLACEMENT (IOC) (Left Eye)  Patient Location: Short Stay  Anesthesia Type:MAC  Level of Consciousness: awake  Airway & Oxygen Therapy: Patient Spontanous Breathing  Post-op Assessment: Report given to RN  Post vital signs: Reviewed  Last Vitals:  Vitals Value Taken Time  BP    Temp    Pulse    Resp    SpO2      Last Pain:  Vitals:   11/14/17 1043  TempSrc: Oral  PainSc: 0-No pain      Patients Stated Pain Goal: 8 (80/16/55 3748)  Complications: No apparent anesthesia complications

## 2017-11-14 NOTE — Anesthesia Preprocedure Evaluation (Signed)
Anesthesia Evaluation  Patient identified by MRN, date of birth, ID band Patient awake    Reviewed: Allergy & Precautions, H&P , NPO status , Patient's Chart, lab work & pertinent test results  Airway Mallampati: III  TM Distance: >3 FB Neck ROM: full    Dental no notable dental hx.    Pulmonary neg pulmonary ROS, former smoker,    Pulmonary exam normal breath sounds clear to auscultation       Cardiovascular Exercise Tolerance: Good negative cardio ROS   Rhythm:regular Rate:Normal     Neuro/Psych negative neurological ROS  negative psych ROS   GI/Hepatic negative GI ROS, Neg liver ROS,   Endo/Other  diabetesHypothyroidism   Renal/GU negative Renal ROS  negative genitourinary   Musculoskeletal   Abdominal   Peds  Hematology negative hematology ROS (+)   Anesthesia Other Findings   Reproductive/Obstetrics negative OB ROS                             Anesthesia Physical Anesthesia Plan  ASA: III  Anesthesia Plan: MAC   Post-op Pain Management:    Induction:   PONV Risk Score and Plan:   Airway Management Planned:   Additional Equipment:   Intra-op Plan:   Post-operative Plan:   Informed Consent: I have reviewed the patients History and Physical, chart, labs and discussed the procedure including the risks, benefits and alternatives for the proposed anesthesia with the patient or authorized representative who has indicated his/her understanding and acceptance.     Plan Discussed with: CRNA  Anesthesia Plan Comments:         Anesthesia Quick Evaluation

## 2017-11-15 ENCOUNTER — Encounter (HOSPITAL_COMMUNITY): Payer: Self-pay | Admitting: Ophthalmology

## 2017-11-17 ENCOUNTER — Ambulatory Visit (INDEPENDENT_AMBULATORY_CARE_PROVIDER_SITE_OTHER): Payer: Medicare Other | Admitting: Otolaryngology

## 2017-11-17 DIAGNOSIS — R040 Epistaxis: Secondary | ICD-10-CM

## 2017-11-24 ENCOUNTER — Encounter (HOSPITAL_COMMUNITY)
Admission: RE | Admit: 2017-11-24 | Discharge: 2017-11-24 | Disposition: A | Discharge status: Home / Self Care | Payer: Medicare Other | Source: Ambulatory Visit | Attending: Ophthalmology | Admitting: Ophthalmology

## 2017-11-24 ENCOUNTER — Encounter (HOSPITAL_COMMUNITY): Payer: Self-pay

## 2017-11-28 ENCOUNTER — Ambulatory Visit (HOSPITAL_COMMUNITY): Payer: Medicare Other | Admitting: Anesthesiology

## 2017-11-28 ENCOUNTER — Ambulatory Visit (HOSPITAL_COMMUNITY)
Admission: RE | Admit: 2017-11-28 | Discharge: 2017-11-28 | Disposition: A | Discharge status: Home / Self Care | Payer: Medicare Other | Source: Ambulatory Visit | Attending: Ophthalmology | Admitting: Ophthalmology

## 2017-11-28 ENCOUNTER — Encounter (HOSPITAL_COMMUNITY)
Admission: RE | Disposition: A | Discharge status: Home / Self Care | Payer: Self-pay | Source: Ambulatory Visit | Attending: Ophthalmology

## 2017-11-28 ENCOUNTER — Encounter (HOSPITAL_COMMUNITY): Payer: Self-pay | Admitting: *Deleted

## 2017-11-28 DIAGNOSIS — E039 Hypothyroidism, unspecified: Secondary | ICD-10-CM | POA: Insufficient documentation

## 2017-11-28 DIAGNOSIS — H2511 Age-related nuclear cataract, right eye: Secondary | ICD-10-CM | POA: Diagnosis not present

## 2017-11-28 DIAGNOSIS — Z7984 Long term (current) use of oral hypoglycemic drugs: Secondary | ICD-10-CM | POA: Diagnosis not present

## 2017-11-28 DIAGNOSIS — Z79890 Hormone replacement therapy (postmenopausal): Secondary | ICD-10-CM | POA: Diagnosis not present

## 2017-11-28 DIAGNOSIS — Z7982 Long term (current) use of aspirin: Secondary | ICD-10-CM | POA: Diagnosis not present

## 2017-11-28 DIAGNOSIS — E1136 Type 2 diabetes mellitus with diabetic cataract: Secondary | ICD-10-CM | POA: Diagnosis present

## 2017-11-28 HISTORY — PX: CATARACT EXTRACTION W/PHACO: SHX586

## 2017-11-28 LAB — GLUCOSE, CAPILLARY: GLUCOSE-CAPILLARY: 116 mg/dL — AB (ref 70–99)

## 2017-11-28 SURGERY — PHACOEMULSIFICATION, CATARACT, WITH IOL INSERTION
Anesthesia: Monitor Anesthesia Care | Site: Eye | Laterality: Right

## 2017-11-28 MED ORDER — EPINEPHRINE PF 1 MG/ML IJ SOLN
INTRAMUSCULAR | Status: AC
  Filled 2017-11-28: qty 1

## 2017-11-28 MED ORDER — BSS IO SOLN
INTRAOCULAR | Status: DC | PRN
  Administered 2017-11-28: 15 mL

## 2017-11-28 MED ORDER — LIDOCAINE HCL (PF) 1 % IJ SOLN
INTRAMUSCULAR | Status: DC | PRN
  Administered 2017-11-28: .4 mL

## 2017-11-28 MED ORDER — CYCLOPENTOLATE-PHENYLEPHRINE 0.2-1 % OP SOLN
1.0000 [drp] | OPHTHALMIC | Status: AC
  Administered 2017-11-28 (×3): 1 [drp] via OPHTHALMIC

## 2017-11-28 MED ORDER — LACTATED RINGERS IV SOLN
INTRAVENOUS | Status: DC
  Administered 2017-11-28: 1000 mL via INTRAVENOUS

## 2017-11-28 MED ORDER — PROVISC 10 MG/ML IO SOLN
INTRAOCULAR | Status: DC | PRN
  Administered 2017-11-28: 0.85 mL via INTRAOCULAR

## 2017-11-28 MED ORDER — TETRACAINE HCL 0.5 % OP SOLN
1.0000 [drp] | OPHTHALMIC | Status: AC
  Administered 2017-11-28 (×3): 1 [drp] via OPHTHALMIC

## 2017-11-28 MED ORDER — NEOMYCIN-POLYMYXIN-DEXAMETH 3.5-10000-0.1 OP SUSP
OPHTHALMIC | Status: DC | PRN
  Administered 2017-11-28: 1 [drp] via OPHTHALMIC

## 2017-11-28 MED ORDER — PHENYLEPHRINE HCL 2.5 % OP SOLN
1.0000 [drp] | OPHTHALMIC | Status: AC
  Administered 2017-11-28 (×3): 1 [drp] via OPHTHALMIC

## 2017-11-28 MED ORDER — LIDOCAINE HCL 3.5 % OP GEL
1.0000 "application " | Freq: Once | OPHTHALMIC | Status: AC
  Administered 2017-11-28: 1 via OPHTHALMIC
  Filled 2017-11-28: qty 1

## 2017-11-28 MED ORDER — MIDAZOLAM HCL 2 MG/2ML IJ SOLN
INTRAMUSCULAR | Status: AC
  Filled 2017-11-28: qty 2

## 2017-11-28 MED ORDER — EPINEPHRINE PF 1 MG/ML IJ SOLN
INTRAOCULAR | Status: DC | PRN
  Administered 2017-11-28: 500 mL

## 2017-11-28 MED ORDER — POVIDONE-IODINE 5 % OP SOLN
OPHTHALMIC | Status: DC | PRN
  Administered 2017-11-28: 1 via OPHTHALMIC

## 2017-11-28 MED ORDER — MIDAZOLAM HCL 5 MG/5ML IJ SOLN
INTRAMUSCULAR | Status: DC | PRN
  Administered 2017-11-28: 1 mg via INTRAVENOUS

## 2017-11-28 SURGICAL SUPPLY — 12 items
CLOTH BEACON ORANGE TIMEOUT ST (SAFETY) ×1 IMPLANT
EYE SHIELD UNIVERSAL CLEAR (GAUZE/BANDAGES/DRESSINGS) ×1 IMPLANT
GLOVE BIOGEL PI IND STRL 6.5 (GLOVE) IMPLANT
GLOVE BIOGEL PI IND STRL 7.0 (GLOVE) IMPLANT
GLOVE BIOGEL PI INDICATOR 6.5 (GLOVE) ×1
GLOVE BIOGEL PI INDICATOR 7.0 (GLOVE) ×1
PAD ARMBOARD 7.5X6 YLW CONV (MISCELLANEOUS) ×1 IMPLANT
SIGHTPATH CAT PROC W REG LENS (Ophthalmic Related) ×2 IMPLANT
SYRINGE LUER LOK 1CC (MISCELLANEOUS) ×1 IMPLANT
TAPE SURG TRANSPORE 1 IN (GAUZE/BANDAGES/DRESSINGS) IMPLANT
TAPE SURGICAL TRANSPORE 1 IN (GAUZE/BANDAGES/DRESSINGS) ×1
WATER STERILE IRR 250ML POUR (IV SOLUTION) ×1 IMPLANT

## 2017-11-28 NOTE — Anesthesia Preprocedure Evaluation (Addendum)
Anesthesia Evaluation  Patient identified by MRN, date of birth, ID band Patient awake    Reviewed: Allergy & Precautions, NPO status , Patient's Chart, lab work & pertinent test results  Airway Mallampati: II  TM Distance: >3 FB Neck ROM: Full    Dental no notable dental hx. (+) Teeth Intact   Pulmonary neg pulmonary ROS, former smoker,    Pulmonary exam normal breath sounds clear to auscultation       Cardiovascular Exercise Tolerance: Good negative cardio ROS Normal cardiovascular examI Rhythm:Regular Rate:Normal     Neuro/Psych negative neurological ROS  negative psych ROS   GI/Hepatic Neg liver ROS, GERD  ,occ GERD only OTC meds PRN   Endo/Other  diabetes, Well Controlled, Type 2Hypothyroidism   Renal/GU negative Renal ROS  negative genitourinary   Musculoskeletal negative musculoskeletal ROS (+)   Abdominal   Peds negative pediatric ROS (+)  Hematology negative hematology ROS (+)   Anesthesia Other Findings   Reproductive/Obstetrics negative OB ROS                             Anesthesia Physical Anesthesia Plan  ASA: II  Anesthesia Plan: MAC   Post-op Pain Management:    Induction: Intravenous  PONV Risk Score and Plan:   Airway Management Planned: Simple Face Mask and Nasal Cannula  Additional Equipment:   Intra-op Plan:   Post-operative Plan:   Informed Consent: I have reviewed the patients History and Physical, chart, labs and discussed the procedure including the risks, benefits and alternatives for the proposed anesthesia with the patient or authorized representative who has indicated his/her understanding and acceptance.   Dental advisory given  Plan Discussed with: CRNA  Anesthesia Plan Comments:        Anesthesia Quick Evaluation

## 2017-11-28 NOTE — Transfer of Care (Signed)
Immediate Anesthesia Transfer of Care Note  Patient: Kelly Cook  Procedure(s) Performed: CATARACT EXTRACTION PHACO AND INTRAOCULAR LENS PLACEMENT RIGHT EYE CDE=10.42 (Right Eye)  Patient Location: Short Stay  Anesthesia Type:MAC  Level of Consciousness: awake  Airway & Oxygen Therapy: Patient Spontanous Breathing  Post-op Assessment: Report given to RN  Post vital signs: Reviewed  Last Vitals:  Vitals Value Taken Time  BP    Temp    Pulse    Resp    SpO2      Last Pain:  Vitals:   11/28/17 0909  TempSrc: Oral  PainSc: 0-No pain      Patients Stated Pain Goal: 7 (37/16/96 7893)  Complications: No apparent anesthesia complications

## 2017-11-28 NOTE — Final Progress Note (Signed)
No labs needed per Dr. Hilaria Ota.  OK to proceed with surgery.

## 2017-11-28 NOTE — Anesthesia Postprocedure Evaluation (Signed)
Anesthesia Post Note  Patient: Santa Lighter  Procedure(s) Performed: CATARACT EXTRACTION PHACO AND INTRAOCULAR LENS PLACEMENT RIGHT EYE CDE=10.42 (Right Eye)  Patient location during evaluation: Short Stay Anesthesia Type: MAC Level of consciousness: awake and alert and oriented Pain management: pain level controlled Vital Signs Assessment: post-procedure vital signs reviewed and stable Respiratory status: spontaneous breathing Cardiovascular status: blood pressure returned to baseline and stable Postop Assessment: adequate PO intake Anesthetic complications: no     Last Vitals:  Vitals:   11/28/17 0909  BP: (!) 153/66  Resp: 18  Temp: 36.4 C  SpO2: 96%    Last Pain:  Vitals:   11/28/17 0909  TempSrc: Oral  PainSc: 0-No pain                 Shaakira Borrero

## 2017-11-28 NOTE — Op Note (Signed)
Date of Admission: 11/28/2017  Date of Surgery: 11/28/2017  Pre-Op Dx: Cataract Right  Eye  Post-Op Dx: Senile Nuclear Cataract  Right  Eye,  Dx Code H25.11  Surgeon: Tonny Branch, M.D.  Assistants: None  Anesthesia: Topical with MAC  Indications: Painless, progressive loss of vision with compromise of daily activities.  Surgery: Cataract Extraction with Intraocular lens Implant Right Eye  Discription: The patient had dilating drops and viscous lidocaine placed into the Right eye in the pre-op holding area. After transfer to the operating room, a time out was performed. The patient was then prepped and draped. Beginning with a 64m blade a paracentesis port was made at the surgeon's 2 o'clock position. The anterior chamber was then filled with 1% non-preserved lidocaine. This was followed by filling the anterior chamber with Provisc.  A 2.461mkeratome blade was used to make a clear corneal incision at the temporal limbus.  A bent cystatome needle was used to create a continuous tear capsulotomy. Hydrodissection was performed with balanced salt solution on a Fine canula. The lens nucleus was then removed using the phacoemulsification handpiece. Residual cortex was removed with the I&A handpiece. The anterior chamber and capsular bag were refilled with Provisc. A posterior chamber intraocular lens was placed into the capsular bag with it's injector. The implant was positioned with the Kuglan hook. The Provisc was then removed from the anterior chamber and capsular bag with the I&A handpiece. Stromal hydration of the main incision and paracentesis port was performed with BSS on a Fine canula. The wounds were tested for leak which was negative. The patient tolerated the procedure well. There were no operative complications. The patient was then transferred to the recovery room in stable condition.  Complications: None  Specimen: None  EBL: None  Prosthetic device: J&J Technis, PCB00, power 25.5,  SN [id number]

## 2017-11-28 NOTE — H&P (Signed)
I have reviewed the H&P, the patient was re-examined, and I have identified no interval changes in medical condition and plan of care since the history and physical of record  

## 2017-11-28 NOTE — Discharge Instructions (Signed)

## 2017-11-29 ENCOUNTER — Encounter (HOSPITAL_COMMUNITY): Payer: Self-pay | Admitting: Ophthalmology

## 2018-07-24 ENCOUNTER — Other Ambulatory Visit: Payer: Self-pay | Admitting: Internal Medicine

## 2018-07-24 DIAGNOSIS — E2839 Other primary ovarian failure: Secondary | ICD-10-CM

## 2018-07-24 DIAGNOSIS — E049 Nontoxic goiter, unspecified: Secondary | ICD-10-CM

## 2018-07-24 DIAGNOSIS — Z780 Asymptomatic menopausal state: Secondary | ICD-10-CM

## 2018-07-28 ENCOUNTER — Other Ambulatory Visit: Payer: Self-pay

## 2018-07-28 ENCOUNTER — Other Ambulatory Visit: Payer: Self-pay | Admitting: Internal Medicine

## 2018-07-28 ENCOUNTER — Encounter (HOSPITAL_COMMUNITY): Payer: Self-pay

## 2018-07-28 ENCOUNTER — Ambulatory Visit (HOSPITAL_COMMUNITY)
Admission: RE | Admit: 2018-07-28 | Discharge: 2018-07-28 | Disposition: A | Discharge status: Home / Self Care | Payer: Medicare Other | Source: Ambulatory Visit | Attending: Internal Medicine | Admitting: Internal Medicine

## 2018-07-28 DIAGNOSIS — E2839 Other primary ovarian failure: Secondary | ICD-10-CM

## 2018-07-28 DIAGNOSIS — Z1231 Encounter for screening mammogram for malignant neoplasm of breast: Secondary | ICD-10-CM | POA: Insufficient documentation

## 2018-08-07 ENCOUNTER — Other Ambulatory Visit: Payer: Self-pay | Admitting: Adult Health Nurse Practitioner

## 2018-08-07 ENCOUNTER — Other Ambulatory Visit (HOSPITAL_COMMUNITY): Payer: Self-pay | Admitting: Adult Health Nurse Practitioner

## 2018-08-07 DIAGNOSIS — E049 Nontoxic goiter, unspecified: Secondary | ICD-10-CM

## 2018-08-11 ENCOUNTER — Ambulatory Visit (HOSPITAL_COMMUNITY)
Admission: RE | Admit: 2018-08-11 | Discharge: 2018-08-11 | Disposition: A | Discharge status: Home / Self Care | Payer: Medicare Other | Source: Ambulatory Visit | Attending: Adult Health Nurse Practitioner | Admitting: Adult Health Nurse Practitioner

## 2018-08-11 ENCOUNTER — Other Ambulatory Visit: Payer: Self-pay

## 2018-08-11 DIAGNOSIS — E049 Nontoxic goiter, unspecified: Secondary | ICD-10-CM | POA: Diagnosis not present

## 2018-09-20 IMAGING — CT CT ABD-PELV W/ CM
2 of 5 series · 17 of 46 positions shown, 19 images · IV contrast (iopamidol)
Comparison: None.

CLINICAL DATA: Abdominal pain for several days with hematuria

EXAM:
CT ABDOMEN AND PELVIS WITH CONTRAST
TECHNIQUE: Multidetector CT imaging of the abdomen and pelvis was performed
using the standard protocol following bolus administration of
intravenous contrast.
CONTRAST:  100mL RNZD0U-OLL IOPAMIDOL (RNZD0U-OLL) INJECTION 61%

[Series 2: axial st · axial · 0.73mm/px · z∈[+1000,+1400]mm · 14 of 92 slices shown, 16 images]
[im 6/92  soft-tissue]
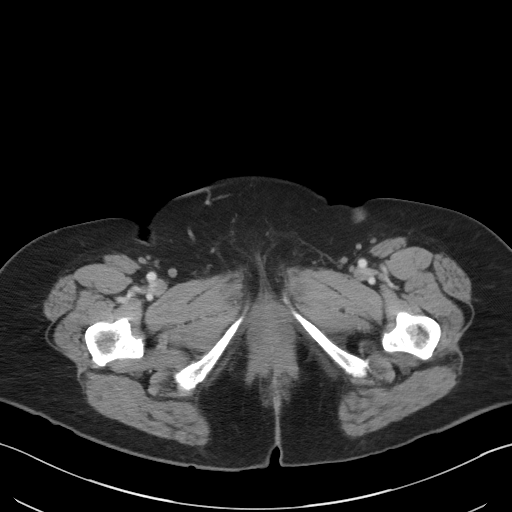
[im 6/92  bone]
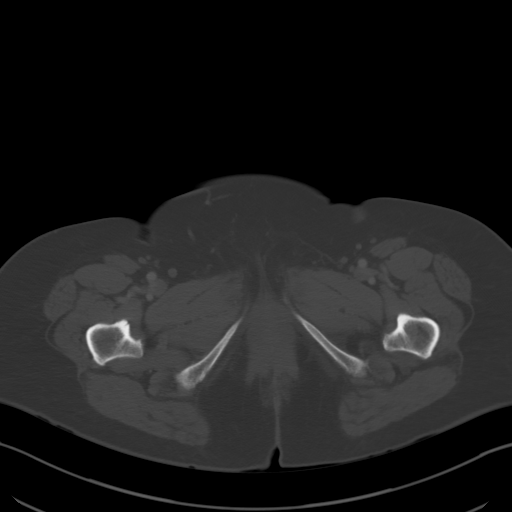
[im 11/92  soft-tissue]
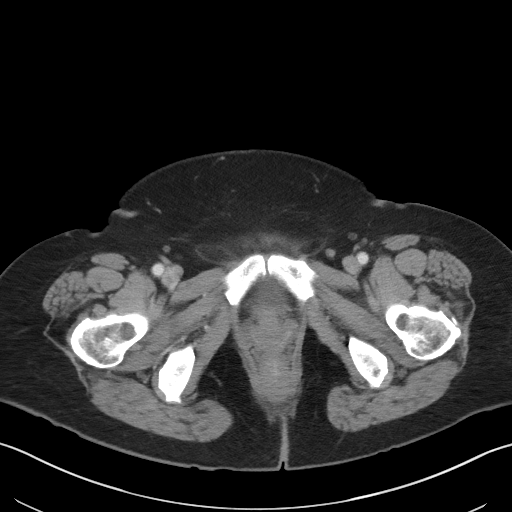
[im 17/92  soft-tissue]
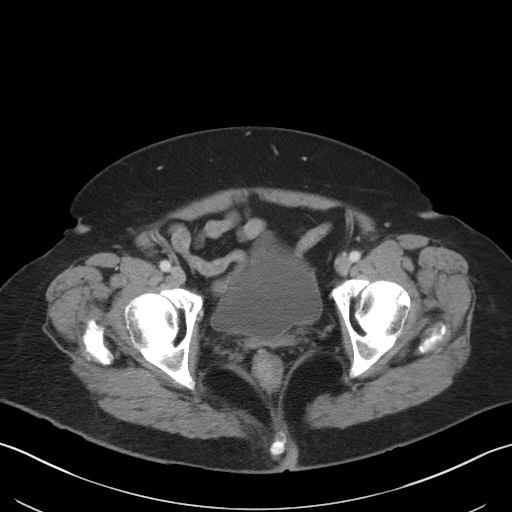
[im 27/92  soft-tissue]
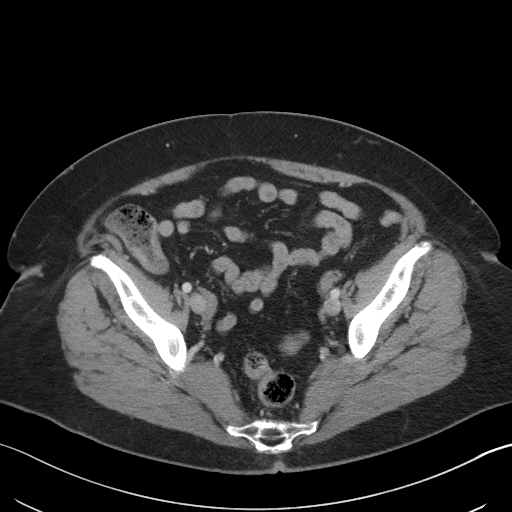
[im 33/92  soft-tissue]
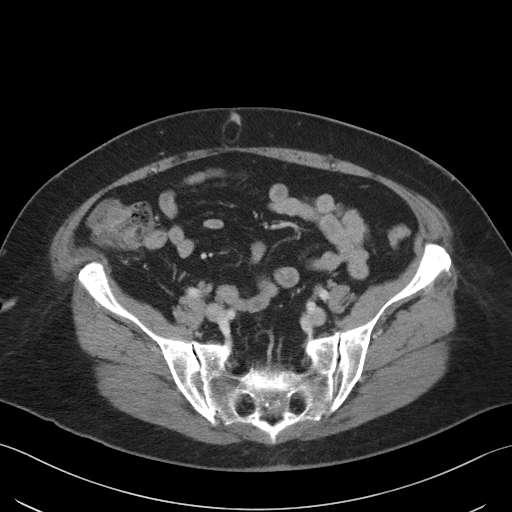
[im 38/92  soft-tissue]
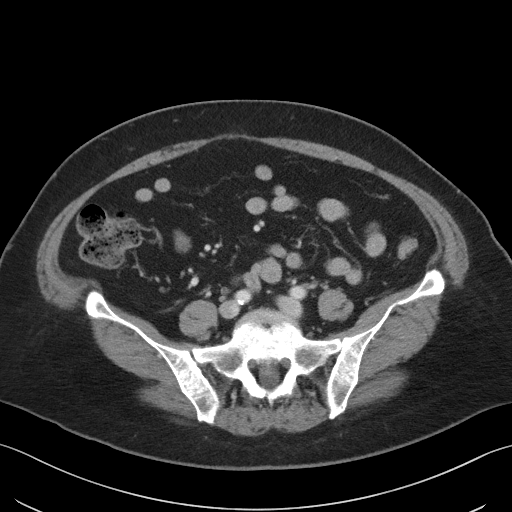
[im 43/92  soft-tissue]
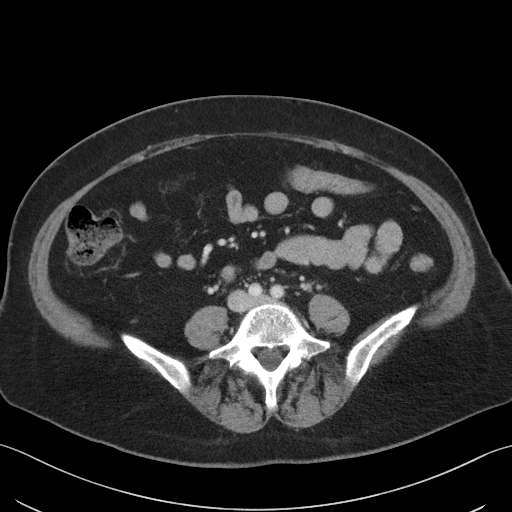
[im 49/92  soft-tissue]
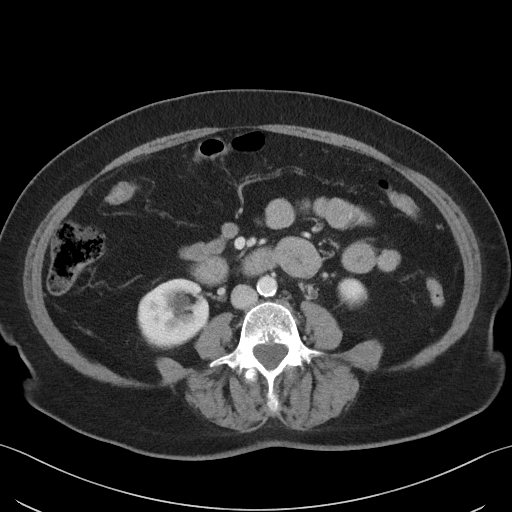
[im 54/92  soft-tissue]
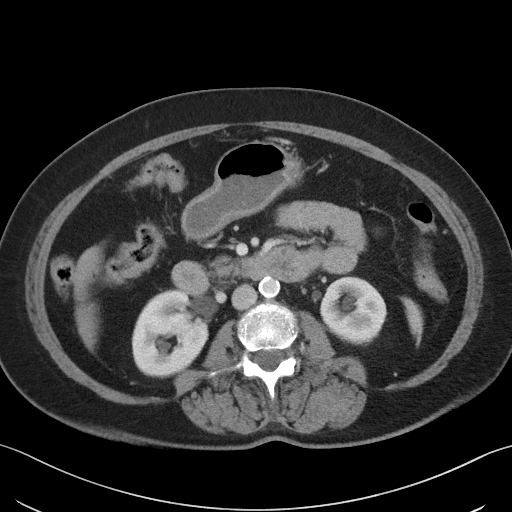
[im 54/92  bone]
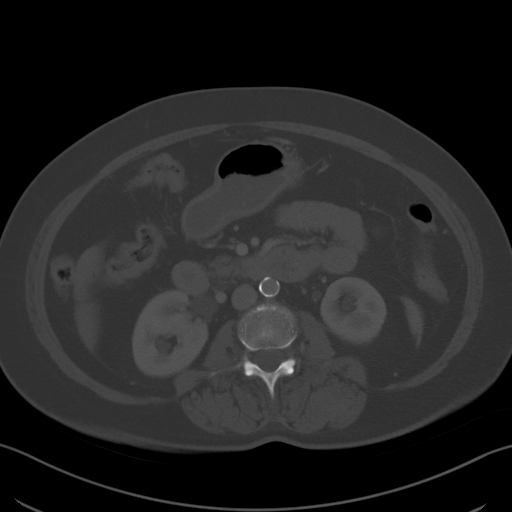
[im 59/92  soft-tissue]
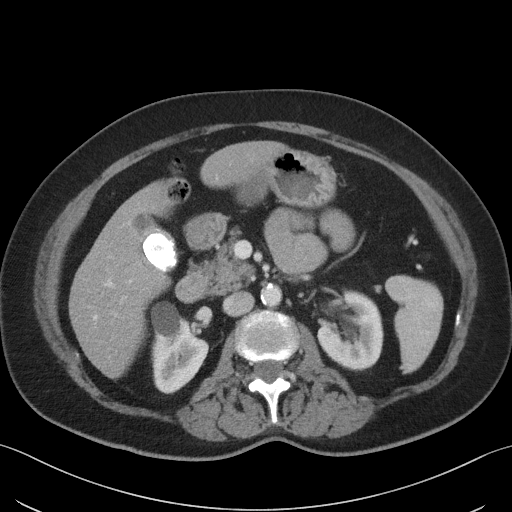
[im 70/92  soft-tissue]
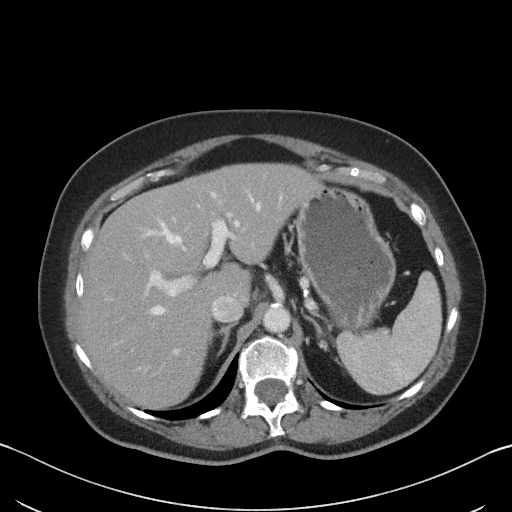
[im 75/92  soft-tissue]
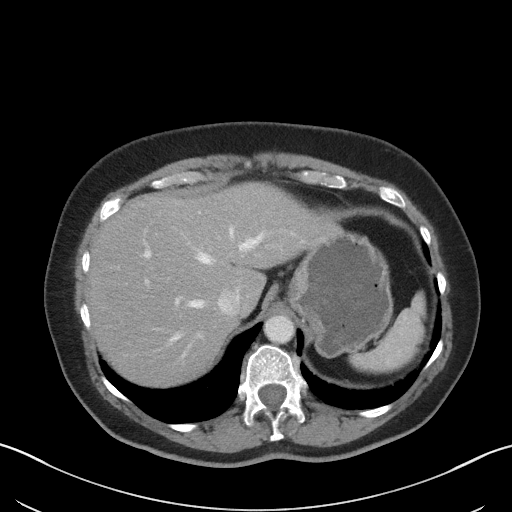
[im 81/92  soft-tissue]
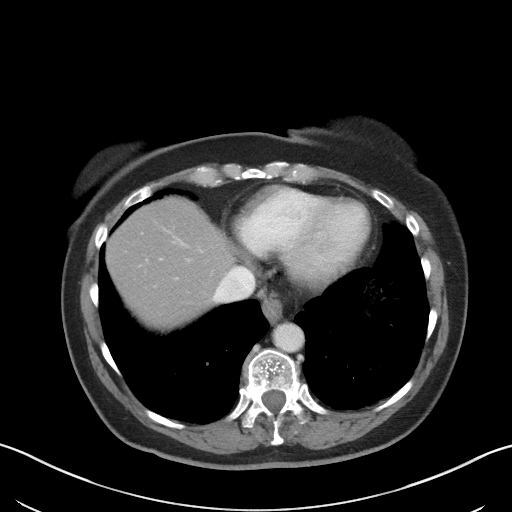
[im 86/92  soft-tissue]
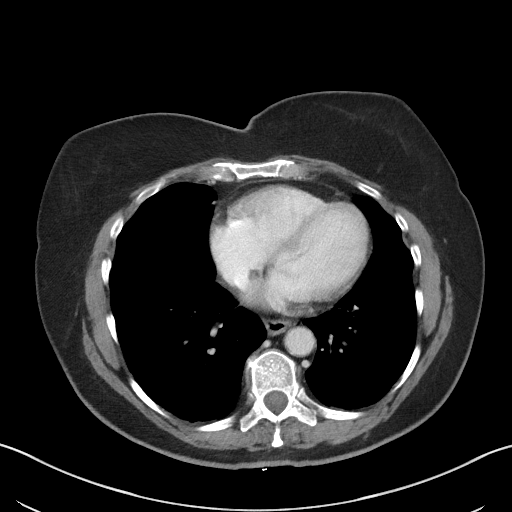

[Series 6: coronal st · coronal · 0.80mm/px · 3 of 103 slices shown]
[im 35/103  soft-tissue]
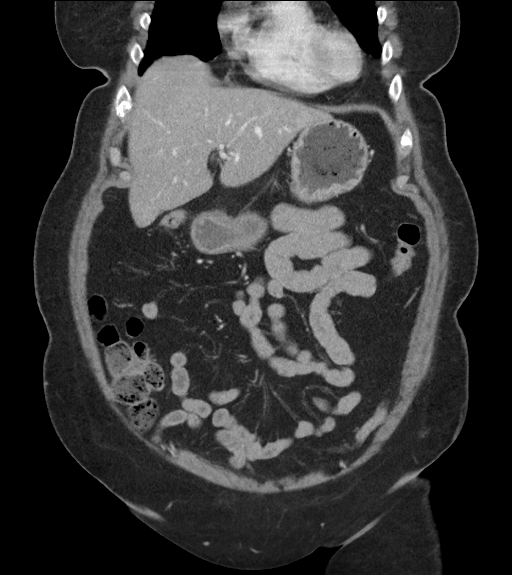
[im 46/103  soft-tissue]
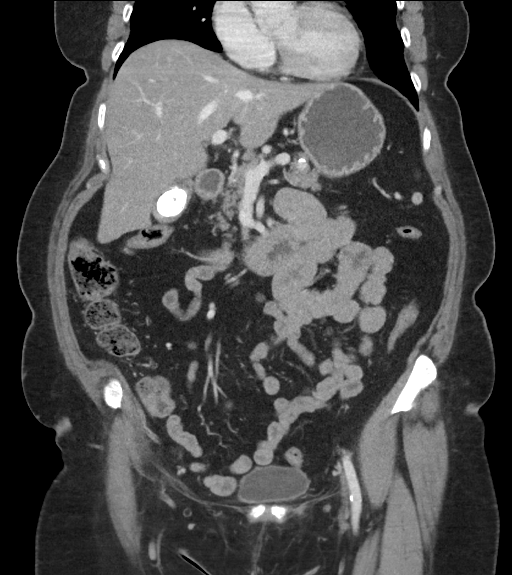
[im 57/103  soft-tissue]
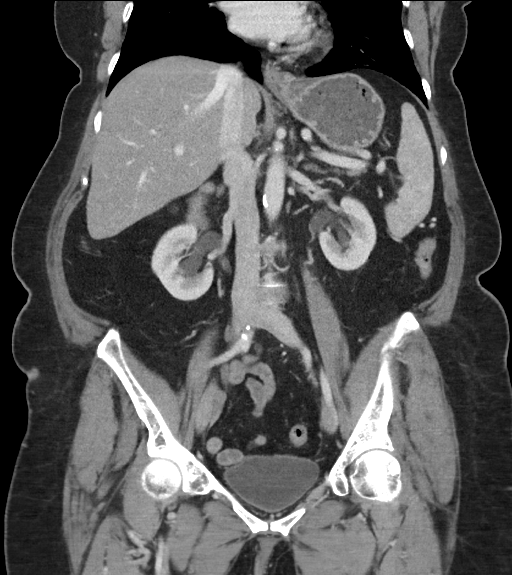

[17 of 46 positions shown; findings below may reference images not displayed]

FINDINGS: Lower chest: No acute abnormality.

Hepatobiliary: Diffuse fatty infiltration of the liver is noted. The
gallbladder is decompressed with a large lamellated gallstone
within.

Pancreas: Unremarkable. No pancreatic ductal dilatation or
surrounding inflammatory changes.

Spleen: Normal in size without focal abnormality.

Adrenals/Urinary Tract: The adrenal glands are within normal limits.
Renal cystic changes are noted bilaterally. Mild obstructive changes
are noted on the left secondary to a 7 mm proximal left ureteral
stone. No calculi are noted on the right. The bladder is partially
distended.

Stomach/Bowel: The appendix has been surgically removed. No
inflammatory or obstructive changes are noted within the bowel.

Vascular/Lymphatic: Aortic atherosclerosis. No enlarged abdominal or
pelvic lymph nodes.

Reproductive: Status post hysterectomy. No adnexal masses.

Other: No abdominal wall hernia or abnormality. No abdominopelvic
ascites.

Musculoskeletal: Mild degenerative changes of lumbar spine are seen.
IMPRESSION: 7 mm proximal left ureteral stone with obstructive change.

## 2018-09-28 IMAGING — CR DG ABDOMEN 1V
1 series · 1 of 1 positions shown · non-contrast
Comparison: Radiograph October 25, 2016. CT scan October 24, 2016.

CLINICAL DATA: Left ureteral stone.

EXAM:
ABDOMEN - 1 VIEW

[t abdomen supine]
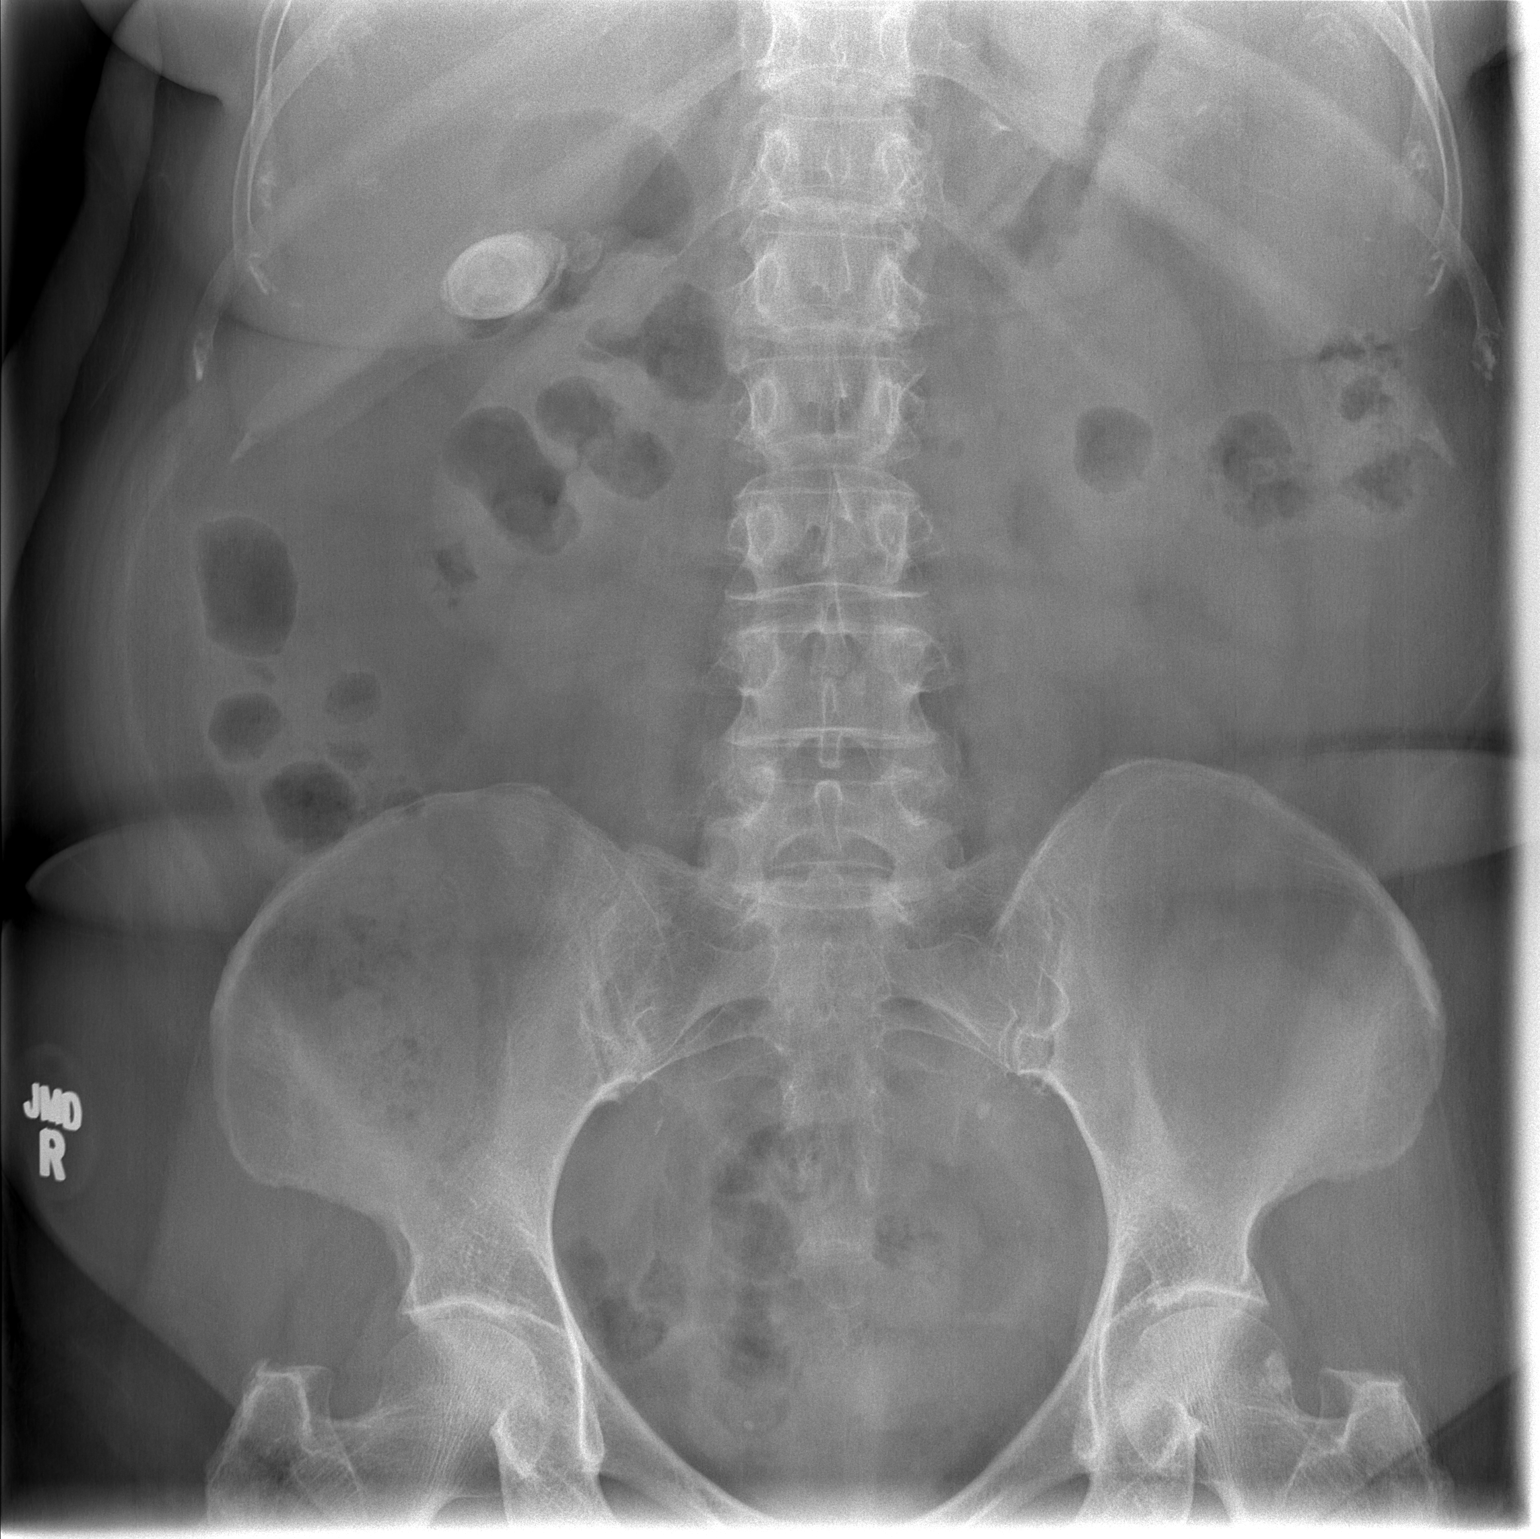

[1 of 1 positions shown; findings below may reference images not displayed]

FINDINGS: The bowel gas pattern is normal. Stable large gallstone is noted.
Left ureteral calculus noted on prior exam has migrated to the
distal left ureter.
IMPRESSION: Left ureteral calculus has migrated to distal left ureter. Large
gallstone is again noted. No evidence of bowel obstruction or ileus.

## 2019-01-30 ENCOUNTER — Other Ambulatory Visit (HOSPITAL_COMMUNITY): Payer: Self-pay | Admitting: Internal Medicine

## 2019-01-30 DIAGNOSIS — Z780 Asymptomatic menopausal state: Secondary | ICD-10-CM

## 2019-01-30 DIAGNOSIS — Z13820 Encounter for screening for osteoporosis: Secondary | ICD-10-CM

## 2019-02-08 ENCOUNTER — Other Ambulatory Visit: Payer: Self-pay

## 2019-02-08 ENCOUNTER — Ambulatory Visit (HOSPITAL_COMMUNITY)
Admission: RE | Admit: 2019-02-08 | Discharge: 2019-02-08 | Disposition: A | Discharge status: Home / Self Care | Payer: Medicare PPO | Source: Ambulatory Visit | Attending: Internal Medicine | Admitting: Internal Medicine

## 2019-02-08 DIAGNOSIS — Z780 Asymptomatic menopausal state: Secondary | ICD-10-CM | POA: Diagnosis not present

## 2019-02-08 DIAGNOSIS — Z13820 Encounter for screening for osteoporosis: Secondary | ICD-10-CM

## 2019-02-08 DIAGNOSIS — M8589 Other specified disorders of bone density and structure, multiple sites: Secondary | ICD-10-CM | POA: Diagnosis not present

## 2019-03-08 ENCOUNTER — Other Ambulatory Visit (HOSPITAL_COMMUNITY): Payer: Medicare Other

## 2019-05-18 ENCOUNTER — Emergency Department (HOSPITAL_COMMUNITY): Payer: Medicare PPO

## 2019-05-18 ENCOUNTER — Other Ambulatory Visit: Payer: Self-pay

## 2019-05-18 ENCOUNTER — Encounter (HOSPITAL_COMMUNITY): Payer: Self-pay

## 2019-05-18 ENCOUNTER — Emergency Department (HOSPITAL_COMMUNITY)
Admission: EM | Admit: 2019-05-18 | Discharge: 2019-05-18 | Disposition: A | Discharge status: Home / Self Care | Payer: Medicare PPO | Attending: Emergency Medicine | Admitting: Emergency Medicine

## 2019-05-18 DIAGNOSIS — E119 Type 2 diabetes mellitus without complications: Secondary | ICD-10-CM | POA: Insufficient documentation

## 2019-05-18 DIAGNOSIS — Z87891 Personal history of nicotine dependence: Secondary | ICD-10-CM | POA: Diagnosis not present

## 2019-05-18 DIAGNOSIS — K8020 Calculus of gallbladder without cholecystitis without obstruction: Secondary | ICD-10-CM | POA: Insufficient documentation

## 2019-05-18 DIAGNOSIS — E039 Hypothyroidism, unspecified: Secondary | ICD-10-CM | POA: Diagnosis not present

## 2019-05-18 DIAGNOSIS — R0789 Other chest pain: Secondary | ICD-10-CM | POA: Diagnosis not present

## 2019-05-18 DIAGNOSIS — J9809 Other diseases of bronchus, not elsewhere classified: Secondary | ICD-10-CM | POA: Diagnosis not present

## 2019-05-18 DIAGNOSIS — R079 Chest pain, unspecified: Secondary | ICD-10-CM

## 2019-05-18 LAB — BASIC METABOLIC PANEL
Anion gap: 10 (ref 5–15)
BUN: 17 mg/dL (ref 8–23)
CO2: 22 mmol/L (ref 22–32)
Calcium: 9.1 mg/dL (ref 8.9–10.3)
Chloride: 108 mmol/L (ref 98–111)
Creatinine, Ser: 0.82 mg/dL (ref 0.44–1.00)
GFR calc Af Amer: >60 mL/min (ref >60)
GFR calc non Af Amer: >60 mL/min (ref >60)
Glucose, Bld: 119 mg/dL — ABNORMAL HIGH (ref 70–99)
Potassium: 4.4 mmol/L (ref 3.5–5.1)
Sodium: 140 mmol/L (ref 135–145)

## 2019-05-18 LAB — TROPONIN I (HIGH SENSITIVITY)
Troponin I (High Sensitivity): <2 ng/L (ref <18)
Troponin I (High Sensitivity): 3 ng/L (ref <18)

## 2019-05-18 LAB — CBC
HCT: 34.2 % — ABNORMAL LOW (ref 36.0–46.0)
Hemoglobin: 11.3 g/dL — ABNORMAL LOW (ref 12.0–15.0)
MCH: 33.8 pg (ref 26.0–34.0)
MCHC: 33 g/dL (ref 30.0–36.0)
MCV: 102.4 fL — ABNORMAL HIGH (ref 80.0–100.0)
Platelets: 293 10*3/uL (ref 150–400)
RBC: 3.34 MIL/uL — ABNORMAL LOW (ref 3.87–5.11)
RDW: 14.6 % (ref 11.5–15.5)
WBC: 7.3 10*3/uL (ref 4.0–10.5)
nRBC: 0.7 % — ABNORMAL HIGH (ref 0.0–0.2)

## 2019-05-18 MED ORDER — SODIUM CHLORIDE 0.9% FLUSH: 3.0000 mL | Freq: Once | INTRAVENOUS | Status: DC

## 2019-05-18 MED ORDER — HYDROCODONE-ACETAMINOPHEN 5-325 MG PO TABS
1.0000 | ORAL_TABLET | Freq: Once | ORAL | Status: AC
  Administered 2019-05-18: 1 via ORAL
  Filled 2019-05-18: qty 1

## 2019-05-18 MED ORDER — ASPIRIN 81 MG PO CHEW
324.0000 mg | CHEWABLE_TABLET | Freq: Once | ORAL | Status: AC
  Administered 2019-05-18: 13:00:00 324 mg via ORAL
  Filled 2019-05-18: qty 4

## 2019-05-18 NOTE — ED Provider Notes (Signed)
[facility] Emergency Department Provider Note MRN:  [id number]  Arrival date & time: 05/18/19     Chief Complaint   Chest Pain   History of Present Illness   Kelly Cook is a 79 y.o. year-old female with a history of diabetes presenting to the ED with chief complaint of chest pain.  2 or 3 days of intermittent chest pain.  Seems to be worse with exertion.  Was much worse this morning, but currently is feeling better, very mild, 1 or 2 out of 10 in severity.  Described as a pressure-like pain in the center of the chest, occasional radiation to the left side of the chest and once or twice radiated to the back.  Denies any other associated symptoms, no nausea, no vomiting, no shortness of breath, no dizziness, no diaphoresis.  No recent fever or chills or cough.  No abdominal pain, no numbness or weakness.  Review of Systems  A complete 10 system review of systems was obtained and all systems are negative except as noted in the HPI and PMH.   Patient's Health History    Past Medical History:  Diagnosis Date  . 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 Constipation 06/16/2012  . Diabetes mellitus   . Gall bladder stones   . Hemorrhoids   . History of kidney stones   . Hypothyroid 06/16/2012  . Hypothyroid 06/16/2012  . Osteopenia   . Osteoporosis   . Otitis externa 12/01/2012  . Shingles   . Urinary incontinence 07/02/2015    Past Surgical History:  Procedure Laterality Date  . ABDOMINAL HYSTERECTOMY     dysfunctional uterine bleeding  . APPENDECTOMY    . CATARACT EXTRACTION W/PHACO Left 11/14/2017   Procedure: CATARACT EXTRACTION PHACO AND INTRAOCULAR LENS PLACEMENT (IOC);  Surgeon: Tonny Branch, MD;  Location: [facility];  Service: Ophthalmology;  Laterality: Left;  CDE: 9.02  . CATARACT EXTRACTION W/PHACO Right 11/28/2017   Procedure: CATARACT EXTRACTION PHACO AND INTRAOCULAR LENS  PLACEMENT RIGHT EYE CDE=10.42;  Surgeon: Tonny Branch, MD;  Location: [facility];  Service: Ophthalmology;  Laterality: Right;  right  . COLONOSCOPY N/A 11/27/2015   Procedure: COLONOSCOPY;  Surgeon: Rogene Houston, MD;  Location: [facility];  Service: Endoscopy;  Laterality: N/A;  730  . EXTRACORPOREAL SHOCK WAVE LITHOTRIPSY Left 11/01/2016   Procedure: LEFT EXTRACORPOREAL SHOCK WAVE LITHOTRIPSY (ESWL);  Surgeon: Festus Aloe, MD;  Location: [facility];  Service: Urology;  Laterality: Left;  . salivary tumor    . TONSILLECTOMY     adenoids  . TUBAL LIGATION      Family History  Problem Relation Age of Onset  . Cancer Father   . Dementia Mother   . Diabetes Brother   . Heart disease Brother   . Mental retardation Daughter        in group home    Social History   Socioeconomic History  . Marital status: Married    Spouse name: Not on file  . Number of children: Not on file  . Years of education: Not on file  . Highest education level: Not on file  Occupational History  . Not on file  Tobacco Use  . Smoking status: Former Smoker    Years: 24.00    Types: Cigarettes  . Smokeless tobacco: Never Used  Substance and Sexual Activity  . Alcohol use: No  . Drug use: No  . Sexual  activity: Not Currently    Birth control/protection: Surgical    Comment: hyst  Other Topics Concern  . Not on file  Social History Narrative  . Not on file   Social Determinants of Health   Financial Resource Strain:   . Difficulty of Paying Living Expenses:   Food Insecurity:   . Worried About Charity fundraiser in the Last Year:   . Arboriculturist in the Last Year:   Transportation Needs:   . Film/video editor (Medical):   Marland Kitchen Lack of Transportation (Non-Medical):   Physical Activity:   . Days of Exercise per Week:   . Minutes of Exercise per Session:   Stress:   . Feeling of Stress :   Social Connections:   . Frequency of Communication with Friends and Family:   . Frequency of  Social Gatherings with Friends and Family:   . Attends Religious Services:   . Active Member of Clubs or Organizations:   . Attends Archivist Meetings:   Marland Kitchen Marital Status:   Intimate Partner Violence:   . Fear of Current or Ex-Partner:   . Emotionally Abused:   Marland Kitchen Physically Abused:   . Sexually Abused:      Physical Exam   Vitals:   05/18/19 1237  BP: (!) 163/76  Pulse: 87  Resp: 18  Temp: 98.1 F (36.7 C)  SpO2: 97%    CONSTITUTIONAL: Well-appearing, NAD NEURO:  Alert and oriented x 3, no focal deficits EYES:  eyes equal and reactive ENT/NECK:  no LAD, no JVD CARDIO: Regular rate, well-perfused, normal S1 and S2 PULM:  CTAB no wheezing or rhonchi GI/GU:  normal bowel sounds, non-distended, non-tender MSK/SPINE:  No gross deformities, no edema SKIN:  no rash, atraumatic PSYCH:  Appropriate speech and behavior  *Additional and/or pertinent findings included in MDM below  Diagnostic and Interventional Summary    EKG Interpretation  Date/Time:  Friday May 18 2019 12:35:10 EDT Ventricular Rate:  91 PR Interval:    QRS Duration: 92 QT Interval:  363 QTC Calculation: 447 R Axis:   57 Text Interpretation: Sinus rhythm with frequent PAC Atrial premature complexes Low voltage, precordial leads Confirmed by Gerlene Fee [phone]) on 05/18/2019 1:07:36 PM      Labs Reviewed  BASIC METABOLIC PANEL - Abnormal; Notable for the following components:      Result Value   Glucose, Bld 119 (*)    All other components within normal limits  CBC - Abnormal; Notable for the following components:   RBC 3.34 (*)    Hemoglobin 11.3 (*)    HCT 34.2 (*)    MCV 102.4 (*)    nRBC 0.7 (*)    All other components within normal limits  TROPONIN I (HIGH SENSITIVITY)    DG Chest 2 View  Final Result    US Abdomen Limited RUQ    (Results Pending)    Medications  sodium chloride flush (NS) 0.9 % injection 3 mL (has no administration in time range)  aspirin chewable  tablet 324 mg (324 mg Oral Given 05/18/19 1324)  HYDROcodone-acetaminophen (NORCO/VICODIN) 5-325 MG per tablet 1 tablet (1 tablet Oral Given 05/18/19 1324)     Procedures  /  Critical Care Procedures  ED Course and Medical Decision Making  I have reviewed the triage vital signs, the nursing notes, and pertinent available records from the EMR.  Listed above are laboratory and imaging tests that I personally ordered, reviewed, and interpreted and then  considered in my medical decision making (see below for details).      Considering acute coronary syndrome versus GI etiology, doubt PE, doubt dissection.  Awaiting chest x-ray, troponin.  EKG is reassuring.  Patient's pain is largely gone.  First troponin is negative, x-ray is reassuring but does comment on cholelithiasis.  Will obtain right upper quadrant ultrasound to exclude cholecystitis or stone in the gallbladder neck as the cause of patient's pain, which is in the lower chest and occasionally in the epigastrium.  With a negative ultrasound and a negative second troponin, patient is appropriate for discharge with close follow-up with PCP or cardiology.  Signed out to oncoming provider at shift change.  Barth Kirks. Sedonia Small, [place] [email or web address]  Final Clinical Impressions(s) / ED Diagnoses     ICD-10-CM   1. Chest pain, unspecified type  R07.9   2. Cholelithiasis  K80.20 US Abdomen Limited RUQ    US Abdomen Limited RUQ    ED Discharge Orders    None       Discharge Instructions Discussed with and Provided to Patient:     Discharge Instructions     You were evaluated in the Emergency Department and after careful evaluation, we did not find any emergent condition requiring admission or further testing in the hospital.  Your exam/testing today is overall reassuring.  We recommend follow-up with your primary care doctor within the next 1 or 2 weeks to discuss need for  outpatient stress testing of your heart.  Please return to the Emergency Department if you experience any worsening of your condition.  We encourage you to follow up with a primary care provider.  Thank you for allowing [place] to be a part of your care.       Maudie Flakes, MD 05/18/19 1406

## 2019-05-18 NOTE — Discharge Instructions (Addendum)
You were evaluated in the Emergency Department and after careful evaluation, we did not find any emergent condition requiring admission or further testing in the hospital.  Your exam/testing today is overall reassuring.  We recommend follow-up with your primary care doctor within the next 1 or 2 weeks to discuss need for outpatient stress testing of your heart.  Please return to the Emergency Department if you experience any worsening of your condition.  We encourage you to follow up with a primary care provider.  Thank you for allowing [place] to be a part of your care.

## 2019-05-18 NOTE — ED Provider Notes (Signed)
Pt initially seen by Dr Sedonia Small.  Please see his note.  Delta trop negative. [place] without signs of cholecystitis.  Stable for discharge.   Dorie Rank, MD 05/18/19 [id number]

## 2019-05-18 NOTE — ED Triage Notes (Signed)
Pt reports pain in center of chest and upper abd for past few days.  Reports pain radiates through to her back.  Denies any n/v or sob.  Reports has had reflux.

## 2019-05-30 DIAGNOSIS — R079 Chest pain, unspecified: Secondary | ICD-10-CM | POA: Diagnosis not present

## 2019-05-30 DIAGNOSIS — K8020 Calculus of gallbladder without cholecystitis without obstruction: Secondary | ICD-10-CM | POA: Diagnosis not present

## 2019-06-12 ENCOUNTER — Other Ambulatory Visit: Payer: Self-pay

## 2019-06-12 ENCOUNTER — Ambulatory Visit: Payer: Medicare PPO | Admitting: General Surgery

## 2019-06-12 ENCOUNTER — Encounter: Payer: Self-pay | Admitting: General Surgery

## 2019-06-12 VITALS — BP 128/75 | HR 86 | Temp 97.8°F | Resp 12 | Ht 61.0 in | Wt 159.0 lb

## 2019-06-12 DIAGNOSIS — K8020 Calculus of gallbladder without cholecystitis without obstruction: Secondary | ICD-10-CM | POA: Diagnosis not present

## 2019-06-12 NOTE — H&P (View-Only) (Signed)
[facility] History and Physical  Reason for Referral: Gallstones  Referring Physician:  Celene Squibb, MD   Chief Complaint    Follow-up      Kelly Cook is a 79 y.o. female.  HPI: Kelly Cook is a very sweet 79 yo who was recently seen in the ED 05/18/2019 for acute onset of epigastric pain and pressure that radiated to her back and right side. She had some associated nausea and early satiety. She says she also has some "tingling/ briar patch" pain in the right side.  She says that she had shingles in this area in the past and that this has started bothering her again. She denies any lesions at this time.  She went to the ED and they first worked her up for cardiac issues. She had a normal EKG and troponins were negative. She denies any prior cardiac history. She has known for some time about having gallstones from prior imaging. During her workup in the ED her CXR actually demonstrated a large calcified stone and [place] was done demonstrating no signs of cholecystitis.   Currently she still feels sore in her upper abdomen. She does her activities of daily living.    Past Medical History:  Diagnosis Date  . 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 Constipation 06/16/2012  . Diabetes mellitus   . Gall bladder stones   . Hemorrhoids   . History of kidney stones   . Hypothyroid 06/16/2012  . Hypothyroid 06/16/2012  . Osteopenia   . Osteoporosis   . Otitis externa 12/01/2012  . Shingles   . Urinary incontinence 07/02/2015    Past Surgical History:  Procedure Laterality Date  . ABDOMINAL HYSTERECTOMY     dysfunctional uterine bleeding  . APPENDECTOMY    . CATARACT EXTRACTION W/PHACO Left 11/14/2017   Procedure: CATARACT EXTRACTION PHACO AND INTRAOCULAR LENS PLACEMENT (IOC);  Surgeon: Tonny Branch, MD;  Location: [facility];  Service: Ophthalmology;  Laterality: Left;  CDE: 9.02  . CATARACT EXTRACTION  W/PHACO Right 11/28/2017   Procedure: CATARACT EXTRACTION PHACO AND INTRAOCULAR LENS PLACEMENT RIGHT EYE CDE=10.42;  Surgeon: Tonny Branch, MD;  Location: [facility];  Service: Ophthalmology;  Laterality: Right;  right  . COLONOSCOPY N/A 11/27/2015   Procedure: COLONOSCOPY;  Surgeon: Rogene Houston, MD;  [facility];  Service: Endoscopy;  Laterality: N/A;  730  . EXTRACORPOREAL SHOCK WAVE LITHOTRIPSY Left 11/01/2016   Procedure: LEFT EXTRACORPOREAL SHOCK WAVE LITHOTRIPSY (ESWL);  Surgeon: Festus Aloe, MD;  Location: [facility];  Service: Urology;  Laterality: Left;  . salivary tumor    . TONSILLECTOMY     adenoids  . TUBAL LIGATION      Family History  Problem Relation Age of Onset  . Cancer Father   . Dementia Mother   . Diabetes Brother   . Heart disease Brother   . Mental retardation Daughter        in group home    Social History   Tobacco Use  . Smoking status: Former Smoker    Years: 24.00    Types: Cigarettes  . Smokeless tobacco: Never Used  Substance Use Topics  . Alcohol use: No  . Drug use: No    Medications: I have reviewed the patient's current medications. Allergies as of 06/12/2019   No Known Allergies     Medication List       Accurate as of Jun 12, 2019 11:40 AM. If you have any questions, ask your nurse or doctor.        STOP taking these medications   alendronate 70 MG tablet Commonly known as: FOSAMAX Stopped by: Virl Cagey, MD   aspirin 81 MG tablet Stopped by: Virl Cagey, MD   dicyclomine 20 MG tablet Commonly known as: BENTYL Stopped by: Virl Cagey, MD   METAMUCIL PO Stopped by: Virl Cagey, MD   ondansetron 4 MG tablet Commonly known as: ZOFRAN Stopped by: Virl Cagey, MD   oxyCODONE-acetaminophen 5-325 MG tablet Commonly known as: Percocet Stopped by: Virl Cagey, MD     TAKE these medications   1st Choice Lancets Ultra Thin Misc   Accu-Chek Aviva Plus test strip Generic  drug: glucose blood USE TO TEST BLOOD SUGAR EVERY DAY   acetaminophen 500 MG tablet Commonly known as: TYLENOL Take 500 mg by mouth every 6 (six) hours as needed for moderate pain.   levothyroxine 88 MCG tablet Commonly known as: SYNTHROID What changed: Another medication with the same name was removed. Continue taking this medication, and follow the directions you see here. Changed by: Virl Cagey, MD   metFORMIN 500 MG tablet Commonly known as: GLUCOPHAGE Take 500 mg by mouth at bedtime. What changed: Another medication with the same name was removed. Continue taking this medication, and follow the directions you see here. Changed by: Virl Cagey, MD   sodium chloride 0.65 % Soln nasal spray Commonly known as: OCEAN Place 1 spray into both nostrils daily as needed for congestion.        ROS:  A comprehensive review of systems was negative except for: Gastrointestinal: positive for abdominal pain, nausea, reflux symptoms and vomiting  Blood pressure 128/75, pulse 86, temperature 97.8 F (36.6 C), temperature source Oral, resp. rate 12, height 5\' 1"  (1.549 m), weight 159 lb (72.1 kg), SpO2 93 %. Physical Exam Vitals reviewed.  Constitutional:      Appearance: She is normal weight.  HENT:     Head: Normocephalic and atraumatic.     Nose: Nose normal.     Mouth/Throat:     Mouth: Mucous membranes are moist.  Eyes:     Pupils: Pupils are equal, round, and reactive to light.  Cardiovascular:     Rate and Rhythm: Normal rate and regular rhythm.  Pulmonary:     Effort: Pulmonary effort is normal.     Breath sounds: Normal breath sounds.  Abdominal:     General: There is no distension.     Palpations: Abdomen is soft.     Tenderness: There is abdominal tenderness in the right upper quadrant and epigastric area.  Musculoskeletal:        General: No swelling. Normal range of motion.     Cervical back: Normal range of motion.  Skin:    General: Skin is warm  and dry.     Comments: No shingles lesions on the right flank   Neurological:     General: No focal deficit present.     Mental Status: She is alert and oriented to person, place, and time.  Psychiatric:        Mood and Affect: Mood normal.        Behavior: Behavior normal.        Thought Content: Thought content normal.        Judgment: Judgment normal.     Results: Personally reviewed CXR and [place] - large  3cm calcified stone, no thickening or wall edema  CLINICAL DATA:  Cholelithiasis  EXAM: ULTRASOUND ABDOMEN LIMITED RIGHT UPPER QUADRANT  COMPARISON:  CT 10/24/2016  FINDINGS: Gallbladder:  Large echogenic, shadowing stone within the gallbladder lumen measuring up to 3.3 cm in diameter. There is at least 1 adjacent smaller stone. No gallbladder wall thickening is identified, although large stone partially obscures evaluation. No sonographic Murphy sign noted by sonographer.  Common bile duct:  Diameter: 4 mm  Liver:  No focal lesion identified. Diffusely increased hepatic parenchymal echogenicity. Portal vein is patent on color Doppler imaging with normal direction of blood flow towards the liver.  Other: None.  IMPRESSION: 1. Cholelithiasis without sonographic evidence of acute cholecystitis. 2. The echogenicity of the liver is increased. This is a nonspecific finding but is most commonly seen with fatty infiltration of the liver. There are no obvious focal liver lesions.   Electronically Signed   By: Davina Poke D.O.   On: 05/18/2019 15:14   Assessment & Plan:  NASTEHO URAM is a 80 y.o. female with symptomatic gallstones. She is still having some soreness in the area. She says she is worried about this causing cholecystitis when she is older and having issues with surgery. We discussed her symptoms and the tingling symptoms. I am unsure if this is related or if this is from residual nerve damage from her shingles. She has no active  lesions.  We discussed her gallbladder and her large stone and risk of cholecystitis from obstruction of the gallbladder at the cystic duct.   -PLAN: I counseled the patient about the indication, risks and benefits of laparoscopic cholecystectomy.  She understands there is a very small chance for bleeding, infection, injury to normal structures (including common bile duct), conversion to open surgery, persistent symptoms, evolution of postcholecystectomy diarrhea, need for secondary interventions, anesthesia reaction, cardiopulmonary issues and other risks not specifically detailed here. I described the expected recovery, the plan for follow-up and the restrictions during the recovery phase.  All questions were answered.  -Discussed preop COVID testing   All questions were answered to the satisfaction of the patient.   Virl Cagey 06/12/2019, 11:40 AM

## 2019-06-12 NOTE — Patient Instructions (Signed)
Laparoscopic Cholecystectomy Laparoscopic cholecystectomy is surgery to remove the gallbladder. The gallbladder is a pear-shaped organ that lies beneath the liver on the right side of the body. The gallbladder stores bile, which is a fluid that helps the body to digest fats. Cholecystectomy is often done for inflammation of the gallbladder (cholecystitis). This condition is usually caused by a buildup of gallstones (cholelithiasis) in the gallbladder. Gallstones can block the flow of bile, which can result in inflammation and pain. In severe cases, emergency surgery may be required. This procedure is done though small incisions in your abdomen (laparoscopic surgery). A thin scope with a camera (laparoscope) is inserted through one incision. Thin surgical instruments are inserted through the other incisions. In some cases, a laparoscopic procedure may be turned into a type of surgery that is done through a larger incision (open surgery). Tell a health care provider about:  Any allergies you have.  All medicines you are taking, including vitamins, herbs, eye drops, creams, and over-the-counter medicines.  Any problems you or family members have had with anesthetic medicines.  Any blood disorders you have.  Any surgeries you have had.  Any medical conditions you have.  Whether you are pregnant or may be pregnant. What are the risks? Generally, this is a safe procedure. However, problems may occur, including:  Infection.  Bleeding.  Allergic reactions to medicines.  Damage to other structures or organs.  A stone remaining in the common bile duct. The common bile duct carries bile from the gallbladder into the small intestine.  A bile leak from the cyst duct that is clipped when your gallbladder is removed. Medicines  Ask your health care provider about: ? Changing or stopping your regular medicines. This is especially important if you are taking diabetes medicines or blood  thinners. ? Taking medicines such as aspirin and ibuprofen. These medicines can thin your blood. Do not take these medicines before your procedure if your health care provider instructs you not to.  You may be given antibiotic medicine to help prevent infection. General instructions  Let your health care provider know if you develop a cold or an infection before surgery.  Plan to have someone take you home from the hospital or clinic.  Ask your health care provider how your surgical site will be marked or identified. What happens during the procedure?   To reduce your risk of infection: ? Your health care team will wash or sanitize their hands. ? Your skin will be washed with soap. ? Hair may be removed from the surgical area.  An IV tube may be inserted into one of your veins.  You will be given one or more of the following: ? A medicine to help you relax (sedative). ? A medicine to make you fall asleep (general anesthetic).  A breathing tube will be placed in your mouth.  Your surgeon will make several small cuts (incisions) in your abdomen.  The laparoscope will be inserted through one of the small incisions. The camera on the laparoscope will send images to a TV screen (monitor) in the operating room. This lets your surgeon see inside your abdomen.  Air-like gas will be pumped into your abdomen. This will expand your abdomen to give the surgeon more room to perform the surgery.  Other tools that are needed for the procedure will be inserted through the other incisions. The gallbladder will be removed through one of the incisions.  Your common bile duct may be examined. If stones are found  in the common bile duct, they may be removed.  After your gallbladder has been removed, the incisions will be closed with stitches (sutures), staples, or skin glue.  Your incisions may be covered with a bandage (dressing). The procedure may vary among health care providers and  hospitals. What happens after the procedure?  Your blood pressure, heart rate, breathing rate, and blood oxygen level will be monitored until the medicines you were given have worn off.  You will be given medicines as needed to control your pain.  Do not drive for 24 hours if you were given a sedative. This information is not intended to replace advice given to you by your health care provider. Make sure you discuss any questions you have with your health care provider. Document Revised: 12/31/2016 Document Reviewed: 07/07/2015 Elsevier Patient Education  2020 [employer]   Cholelithiasis  Cholelithiasis is a form of gallbladder disease in which gallstones form in the gallbladder. The gallbladder is an organ that stores bile. Bile is made in the liver, and it helps to digest fats. Gallstones begin as small crystals and slowly grow into stones. They may cause no symptoms until the gallbladder tightens (contracts) and a gallstone is blocking the duct (gallbladder attack), which can cause pain. Cholelithiasis is also referred to as gallstones. There are two main types of gallstones:  Cholesterol stones. These are made of hardened cholesterol and are usually yellow-green in color. They are the most common type of gallstone. Cholesterol is a white, waxy, fat-like substance that is made in the liver.  Pigment stones. These are dark in color and are made of a red-yellow substance that forms when hemoglobin from red blood cells breaks down (bilirubin). What are the causes? This condition may be caused by an imbalance in the substances that bile is made of. This can happen if the bile:  Has too much bilirubin.  Has too much cholesterol.  Does not have enough bile salts. These salts help the body absorb and digest fats. In some cases, this condition can also be caused by the gallbladder not emptying completely or often enough. What increases the risk? The following factors may make you more  likely to develop this condition:  Being female.  Having multiple pregnancies. Health care providers sometimes advise removing diseased gallbladders before future pregnancies.  Eating a diet that is heavy in fried foods, fat, and refined carbohydrates, like white bread and white rice.  Being obese.  Being older than age 40.  Prolonged use of medicines that contain female hormones (estrogen).  Having diabetes mellitus.  Rapidly losing weight.  Having a family history of gallstones.  Being of American Indian or [place] descent.  Having an intestinal disease such as Crohn disease.  Having metabolic syndrome.  Having cirrhosis.  Having severe types of anemia such as sickle cell anemia. What are the signs or symptoms? In most cases, there are no symptoms. These are known as silent gallstones. If a gallstone blocks the bile ducts, it can cause a gallbladder attack. The main symptom of a gallbladder attack is sudden pain in the upper right abdomen. The pain usually comes at night or after eating a large meal. The pain can last for one or several hours and can spread to the right shoulder or chest. If the bile duct is blocked for more than a few hours, it can cause infection or inflammation of the gallbladder, liver, or pancreas, which may cause:  Nausea.  Vomiting.  Abdominal pain that lasts for   5 hours or more.  Fever or chills.  Yellowing of the skin or the whites of the eyes (jaundice).  Dark urine.  Light-colored stools. How is this diagnosed? This condition may be diagnosed based on:  A physical exam.  Your medical history.  An ultrasound of your gallbladder.  CT scan.  MRI.  Blood tests to check for signs of infection or inflammation.  A scan of your gallbladder and bile ducts (biliary system) using nonharmful radioactive material and special cameras that can see the radioactive material (cholescintigram). This test checks to see how your gallbladder  contracts and whether bile ducts are blocked.  Inserting a small tube with a camera on the end (endoscope) through your mouth to inspect bile ducts and check for blockages (endoscopic retrograde cholangiopancreatogram). How is this treated? Treatment for gallstones depends on the severity of the condition. Silent gallstones do not need treatment. If the gallstones cause a gallbladder attack or other symptoms, treatment may be required. Options for treatment include:  Surgery to remove the gallbladder (cholecystectomy). This is the most common treatment.  Medicines to dissolve gallstones. These are most effective at treating small gallstones. You may need to take medicines for up to 6-12 months.  Shock wave treatment (extracorporeal biliary lithotripsy). In this treatment, an ultrasound machine sends shock waves to the gallbladder to break gallstones into smaller pieces. These pieces can then be passed into the intestines or be dissolved by medicine. This is rarely used.  Removing gallstones through endoscopic retrograde cholangiopancreatogram. A small basket can be attached to the endoscope and used to capture and remove gallstones. Follow these instructions at home:  Take over-the-counter and prescription medicines only as told by your health care provider.  Maintain a healthy weight and follow a healthy diet. This includes: ? Reducing fatty foods, such as fried food. ? Reducing refined carbohydrates, like white bread and white rice. ? Increasing fiber. Aim for foods like almonds, fruit, and beans.  Keep all follow-up visits as told by your health care provider. This is important. Contact a health care provider if:  You think you have had a gallbladder attack.  You have been diagnosed with silent gallstones and you develop abdominal pain or indigestion. Get help right away if:  You have pain from a gallbladder attack that lasts for more than 2 hours.  You have abdominal pain that  lasts for more than 5 hours.  You have a fever or chills.  You have persistent nausea and vomiting.  You develop jaundice.  You have dark urine or light-colored stools. Summary  Cholelithiasis (also called gallstones) is a form of gallbladder disease in which gallstones form in the gallbladder.  This condition is caused by an imbalance in the substances that make up bile. This can happen if the bile has too much cholesterol, too much bilirubin, or not enough bile salts.  You are more likely to develop this condition if you are female, pregnant, using medicines with estrogen, obese, older than age 40, or have a family history of gallstones. You may also develop gallstones if you have diabetes, an intestinal disease, cirrhosis, or metabolic syndrome.  Treatment for gallstones depends on the severity of the condition. Silent gallstones do not need treatment.  If gallstones cause a gallbladder attack or other symptoms, treatment may be needed. The most common treatment is surgery to remove the gallbladder. This information is not intended to replace advice given to you by your health care provider. Make sure you discuss any questions   you have with your health care provider. Document Revised: 12/31/2016 Document Reviewed: 10/05/2015 Elsevier Patient Education  2020 Elsevier Inc.  

## 2019-06-12 NOTE — Progress Notes (Signed)
[facility] History and Physical  Reason for Referral: Gallstones  Referring Physician:  Celene Squibb, MD   Chief Complaint    Follow-up      Kelly Cook is a 79 y.o. female.  HPI: Kelly Cook is a very sweet 79 yo who was recently seen in the ED 05/18/2019 for acute onset of epigastric pain and pressure that radiated to her back and right side. She had some associated nausea and early satiety. She says she also has some "tingling/ briar patch" pain in the right side.  She says that she had shingles in this area in the past and that this has started bothering her again. She denies any lesions at this time.  She went to the ED and they first worked her up for cardiac issues. She had a normal EKG and troponins were negative. She denies any prior cardiac history. She has known for some time about having gallstones from prior imaging. During her workup in the ED her CXR actually demonstrated a large calcified stone and [place] was done demonstrating no signs of cholecystitis.   Currently she still feels sore in her upper abdomen. She does her activities of daily living.    Past Medical History:  Diagnosis Date  . 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 Constipation 06/16/2012  . Diabetes mellitus   . Gall bladder stones   . Hemorrhoids   . History of kidney stones   . Hypothyroid 06/16/2012  . Hypothyroid 06/16/2012  . Osteopenia   . Osteoporosis   . Otitis externa 12/01/2012  . Shingles   . Urinary incontinence 07/02/2015    Past Surgical History:  Procedure Laterality Date  . ABDOMINAL HYSTERECTOMY     dysfunctional uterine bleeding  . APPENDECTOMY    . CATARACT EXTRACTION W/PHACO Left 11/14/2017   Procedure: CATARACT EXTRACTION PHACO AND INTRAOCULAR LENS PLACEMENT (IOC);  Surgeon: Tonny Branch, MD;  Location: [facility];  Service: Ophthalmology;  Laterality: Left;  CDE: 9.02  . CATARACT EXTRACTION  W/PHACO Right 11/28/2017   Procedure: CATARACT EXTRACTION PHACO AND INTRAOCULAR LENS PLACEMENT RIGHT EYE CDE=10.42;  Surgeon: Tonny Branch, MD;  Location: [facility];  Service: Ophthalmology;  Laterality: Right;  right  . COLONOSCOPY N/A 11/27/2015   Procedure: COLONOSCOPY;  Surgeon: Rogene Houston, MD;  Location: [facility];  Service: Endoscopy;  Laterality: N/A;  730  . EXTRACORPOREAL SHOCK WAVE LITHOTRIPSY Left 11/01/2016   Procedure: LEFT EXTRACORPOREAL SHOCK WAVE LITHOTRIPSY (ESWL);  Surgeon: Festus Aloe, MD;  Location: [facility];  Service: Urology;  Laterality: Left;  . salivary tumor    . TONSILLECTOMY     adenoids  . TUBAL LIGATION      Family History  Problem Relation Age of Onset  . Cancer Father   . Dementia Mother   . Diabetes Brother   . Heart disease Brother   . Mental retardation Daughter        in group home    Social History   Tobacco Use  . Smoking status: Former Smoker    Years: 24.00    Types: Cigarettes  . Smokeless tobacco: Never Used  Substance Use Topics  . Alcohol use: No  . Drug use: No    Medications: I have reviewed the patient's current medications. Allergies as of 06/12/2019   No Known Allergies     Medication List       Accurate as of Jun 12, 2019 11:40 AM. If you have any questions, ask your nurse or doctor.        STOP taking these medications   alendronate 70 MG tablet Commonly known as: FOSAMAX Stopped by: Virl Cagey, MD   aspirin 81 MG tablet Stopped by: Virl Cagey, MD   dicyclomine 20 MG tablet Commonly known as: BENTYL Stopped by: Virl Cagey, MD   METAMUCIL PO Stopped by: Virl Cagey, MD   ondansetron 4 MG tablet Commonly known as: ZOFRAN Stopped by: Virl Cagey, MD   oxyCODONE-acetaminophen 5-325 MG tablet Commonly known as: Percocet Stopped by: Virl Cagey, MD     TAKE these medications   1st Choice Lancets Ultra Thin Misc   Accu-Chek Aviva Plus test strip Generic  drug: glucose blood USE TO TEST BLOOD SUGAR EVERY DAY   acetaminophen 500 MG tablet Commonly known as: TYLENOL Take 500 mg by mouth every 6 (six) hours as needed for moderate pain.   levothyroxine 88 MCG tablet Commonly known as: SYNTHROID What changed: Another medication with the same name was removed. Continue taking this medication, and follow the directions you see here. Changed by: Virl Cagey, MD   metFORMIN 500 MG tablet Commonly known as: GLUCOPHAGE Take 500 mg by mouth at bedtime. What changed: Another medication with the same name was removed. Continue taking this medication, and follow the directions you see here. Changed by: Virl Cagey, MD   sodium chloride 0.65 % Soln nasal spray Commonly known as: OCEAN Place 1 spray into both nostrils daily as needed for congestion.        ROS:  A comprehensive review of systems was negative except for: Gastrointestinal: positive for abdominal pain, nausea, reflux symptoms and vomiting  Blood pressure 128/75, pulse 86, temperature 97.8 F (36.6 C), temperature source Oral, resp. rate 12, height 5\' 1"  (1.549 m), weight 159 lb (72.1 kg), SpO2 93 %. Physical Exam Vitals reviewed.  Constitutional:      Appearance: She is normal weight.  HENT:     Head: Normocephalic and atraumatic.     Nose: Nose normal.     Mouth/Throat:     Mouth: Mucous membranes are moist.  Eyes:     Pupils: Pupils are equal, round, and reactive to light.  Cardiovascular:     Rate and Rhythm: Normal rate and regular rhythm.  Pulmonary:     Effort: Pulmonary effort is normal.     Breath sounds: Normal breath sounds.  Abdominal:     General: There is no distension.     Palpations: Abdomen is soft.     Tenderness: There is abdominal tenderness in the right upper quadrant and epigastric area.  Musculoskeletal:        General: No swelling. Normal range of motion.     Cervical back: Normal range of motion.  Skin:    General: Skin is warm  and dry.     Comments: No shingles lesions on the right flank   Neurological:     General: No focal deficit present.     Mental Status: She is alert and oriented to person, place, and time.  Psychiatric:        Mood and Affect: Mood normal.        Behavior: Behavior normal.        Thought Content: Thought content normal.        Judgment: Judgment normal.     Results: Personally reviewed CXR and [place] - large  3cm calcified stone, no thickening or wall edema  CLINICAL DATA:  Cholelithiasis  EXAM: ULTRASOUND ABDOMEN LIMITED RIGHT UPPER QUADRANT  COMPARISON:  CT 10/24/2016  FINDINGS: Gallbladder:  Large echogenic, shadowing stone within the gallbladder lumen measuring up to 3.3 cm in diameter. There is at least 1 adjacent smaller stone. No gallbladder wall thickening is identified, although large stone partially obscures evaluation. No sonographic Murphy sign noted by sonographer.  Common bile duct:  Diameter: 4 mm  Liver:  No focal lesion identified. Diffusely increased hepatic parenchymal echogenicity. Portal vein is patent on color Doppler imaging with normal direction of blood flow towards the liver.  Other: None.  IMPRESSION: 1. Cholelithiasis without sonographic evidence of acute cholecystitis. 2. The echogenicity of the liver is increased. This is a nonspecific finding but is most commonly seen with fatty infiltration of the liver. There are no obvious focal liver lesions.   Electronically Signed   By: Davina Poke D.O.   On: 05/18/2019 15:14   Assessment & Plan:  RUMAN TANI is a 79 y.o. female with symptomatic gallstones. She is still having some soreness in the area. She says she is worried about this causing cholecystitis when she is older and having issues with surgery. We discussed her symptoms and the tingling symptoms. I am unsure if this is related or if this is from residual nerve damage from her shingles. She has no active  lesions.  We discussed her gallbladder and her large stone and risk of cholecystitis from obstruction of the gallbladder at the cystic duct.   -PLAN: I counseled the patient about the indication, risks and benefits of laparoscopic cholecystectomy.  She understands there is a very small chance for bleeding, infection, injury to normal structures (including common bile duct), conversion to open surgery, persistent symptoms, evolution of postcholecystectomy diarrhea, need for secondary interventions, anesthesia reaction, cardiopulmonary issues and other risks not specifically detailed here. I described the expected recovery, the plan for follow-up and the restrictions during the recovery phase.  All questions were answered.  -Discussed preop COVID testing   All questions were answered to the satisfaction of the patient.   Virl Cagey 06/12/2019, 11:40 AM

## 2019-06-13 NOTE — Patient Instructions (Signed)
Kelly Cook  06/13/2019     @PREFPERIOPPHARMACY @   Your procedure is scheduled on  06/20/2018.  Report to [facility] at  [phone]  A.M.  Call this number if you have problems the morning of surgery:  [phone]   Remember:  Do not eat or drink after midnight.                      Take these medicines the morning of surgery with A SIP OF WATER  Levothyroxine.    Do not wear jewelry, make-up or nail polish.  Do not wear lotions, powders, or perfumes. Please wear deodorant and brush your teeth.  Do not shave 48 hours prior to surgery.  Men may shave face and neck.  Do not bring valuables to the hospital.  [facility] is not responsible for any belongings or valuables.  Contacts, dentures or bridgework may not be worn into surgery.  Leave your suitcase in the car.  After surgery it may be brought to your room.  For patients admitted to the hospital, discharge time will be determined by your treatment team.  Patients discharged the day of surgery will not be allowed to drive home.   Name and phone number of your driver:   family Special instructions:  DO NOT smoke the morning of your procedure.  Please read over the following fact sheets that you were given. Anesthesia Post-op Instructions and Care and Recovery After Surgery       Laparoscopic Cholecystectomy, Care After This sheet gives you information about how to care for yourself after your procedure. Your health care provider may also give you more specific instructions. If you have problems or questions, contact your health care provider. What can I expect after the procedure? After the procedure, it is common to have:  Pain at your incision sites. You will be given medicines to control this pain.  Mild nausea or vomiting.  Bloating and possible shoulder pain from the air-like gas that was used during the procedure. Follow these instructions at home: Incision care   Follow instructions from your  health care provider about how to take care of your incisions. Make sure you: ? Wash your hands with soap and water before you change your bandage (dressing). If soap and water are not available, use hand sanitizer. ? Change your dressing as told by your health care provider. ? Leave stitches (sutures), skin glue, or adhesive strips in place. These skin closures may need to be in place for 2 weeks or longer. If adhesive strip edges start to loosen and curl up, you may trim the loose edges. Do not remove adhesive strips completely unless your health care provider tells you to do that.  Do not take baths, swim, or use a hot tub until your health care provider approves. Ask your health care provider if you can take showers. You may only be allowed to take sponge baths for bathing.  Check your incision area every day for signs of infection. Check for: ? More redness, swelling, or pain. ? More fluid or blood. ? Warmth. ? Pus or a bad smell. Activity  Do not drive or use heavy machinery while taking prescription pain medicine.  Do not lift anything that is heavier than 10 lb (4.5 kg) until your health care provider approves.  Do not play contact sports until your health care provider approves.  Do not drive for 24 hours if  you were given a medicine to help you relax (sedative).  Rest as needed. Do not return to work or school until your health care provider approves. General instructions  Take over-the-counter and prescription medicines only as told by your health care provider.  To prevent or treat constipation while you are taking prescription pain medicine, your health care provider may recommend that you: ? Drink enough fluid to keep your urine clear or pale yellow. ? Take over-the-counter or prescription medicines. ? Eat foods that are high in fiber, such as fresh fruits and vegetables, whole grains, and beans. ? Limit foods that are high in fat and processed sugars, such as fried and  sweet foods. Contact a health care provider if:  You develop a rash.  You have more redness, swelling, or pain around your incisions.  You have more fluid or blood coming from your incisions.  Your incisions feel warm to the touch.  You have pus or a bad smell coming from your incisions.  You have a fever.  One or more of your incisions breaks open. Get help right away if:  You have trouble breathing.  You have chest pain.  You have increasing pain in your shoulders.  You faint or feel dizzy when you stand.  You have severe pain in your abdomen.  You have nausea or vomiting that lasts for more than one day.  You have leg pain. This information is not intended to replace advice given to you by your health care provider. Make sure you discuss any questions you have with your health care provider. Document Revised: 12/31/2016 Document Reviewed: 07/07/2015 Elsevier Patient Education  2020 [place] Anesthesia, Adult, Care After This sheet gives you information about how to care for yourself after your procedure. Your health care provider may also give you more specific instructions. If you have problems or questions, contact your health care provider. What can I expect after the procedure? After the procedure, the following side effects are common:  Pain or discomfort at the IV site.  Nausea.  Vomiting.  Sore throat.  Trouble concentrating.  Feeling cold or chills.  Weak or tired.  Sleepiness and fatigue.  Soreness and body aches. These side effects can affect parts of the body that were not involved in surgery. Follow these instructions at home:  For at least 24 hours after the procedure:  Have a responsible adult stay with you. It is important to have someone help care for you until you are awake and alert.  Rest as needed.  Do not: ? Participate in activities in which you could fall or become injured. ? Drive. ? Use heavy  machinery. ? Drink alcohol. ? Take sleeping pills or medicines that cause drowsiness. ? Make important decisions or sign legal documents. ? Take care of children on your own. Eating and drinking  Follow any instructions from your health care provider about eating or drinking restrictions.  When you feel hungry, start by eating small amounts of foods that are soft and easy to digest (bland), such as toast. Gradually return to your regular diet.  Drink enough fluid to keep your urine pale yellow.  If you vomit, rehydrate by drinking water, juice, or clear broth. General instructions  If you have sleep apnea, surgery and certain medicines can increase your risk for breathing problems. Follow instructions from your health care provider about wearing your sleep device: ? Anytime you are sleeping, including during daytime naps. ? While taking prescription pain medicines,  sleeping medicines, or medicines that make you drowsy.  Return to your normal activities as told by your health care provider. Ask your health care provider what activities are safe for you.  Take over-the-counter and prescription medicines only as told by your health care provider.  If you smoke, do not smoke without supervision.  Keep all follow-up visits as told by your health care provider. This is important. Contact a health care provider if:  You have nausea or vomiting that does not get better with medicine.  You cannot eat or drink without vomiting.  You have pain that does not get better with medicine.  You are unable to pass urine.  You develop a skin rash.  You have a fever.  You have redness around your IV site that gets worse. Get help right away if:  You have difficulty breathing.  You have chest pain.  You have blood in your urine or stool, or you vomit blood. Summary  After the procedure, it is common to have a sore throat or nausea. It is also common to feel tired.  Have a responsible  adult stay with you for the first 24 hours after general anesthesia. It is important to have someone help care for you until you are awake and alert.  When you feel hungry, start by eating small amounts of foods that are soft and easy to digest (bland), such as toast. Gradually return to your regular diet.  Drink enough fluid to keep your urine pale yellow.  Return to your normal activities as told by your health care provider. Ask your health care provider what activities are safe for you. This information is not intended to replace advice given to you by your health care provider. Make sure you discuss any questions you have with your health care provider. Document Revised: 01/21/2017 Document Reviewed: 09/03/2016 Elsevier Patient Education  [place]. How to Use Chlorhexidine for Bathing Chlorhexidine gluconate (CHG) is a germ-killing (antiseptic) solution that is used to clean the skin. It can get rid of the bacteria that normally live on the skin and can keep them away for about 24 hours. To clean your skin with CHG, you may be given:  A CHG solution to use in the shower or as part of a sponge bath.  A prepackaged cloth that contains CHG. Cleaning your skin with CHG may help lower the risk for infection:  While you are staying in the intensive care unit of the hospital.  If you have a vascular access, such as a central line, to provide short-term or long-term access to your veins.  If you have a catheter to drain urine from your bladder.  If you are on a ventilator. A ventilator is a machine that helps you breathe by moving air in and out of your lungs.  After surgery. What are the risks? Risks of using CHG include:  A skin reaction.  Hearing loss, if CHG gets in your ears.  Eye injury, if CHG gets in your eyes and is not rinsed out.  The CHG product catching fire. Make sure that you avoid smoking and flames after applying CHG to your skin. Do not use CHG:  If  you have a chlorhexidine allergy or have previously reacted to chlorhexidine.  On babies younger than 7 months of age. How to use CHG solution  Use CHG only as told by your health care provider, and follow the instructions on the label.  Use the full amount of CHG  as directed. Usually, this is one bottle. During a shower Follow these steps when using CHG solution during a shower (unless your health care provider gives you different instructions): 1. Start the shower. 2. Use your normal soap and shampoo to wash your face and hair. 3. Turn off the shower or move out of the shower stream. 4. Pour the CHG onto a clean washcloth. Do not use any type of brush or rough-edged sponge. 5. Starting at your neck, lather your body down to your toes. Make sure you follow these instructions: ? If you will be having surgery, pay special attention to the part of your body where you will be having surgery. Scrub this area for at least 1 minute. ? Do not use CHG on your head or face. If the solution gets into your ears or eyes, rinse them well with water. ? Avoid your genital area. ? Avoid any areas of skin that have broken skin, cuts, or scrapes. ? Scrub your back and under your arms. Make sure to wash skin folds. 6. Let the lather sit on your skin for 1-2 minutes or as long as told by your health care provider. 7. Thoroughly rinse your entire body in the shower. Make sure that all body creases and crevices are rinsed well. 8. Dry off with a clean towel. Do not put any substances on your body afterward--such as powder, lotion, or perfume--unless you are told to do so by your health care provider. Only use lotions that are recommended by the manufacturer. 9. Put on clean clothes or pajamas. 10. If it is the night before your surgery, sleep in clean sheets.  During a sponge bath Follow these steps when using CHG solution during a sponge bath (unless your health care provider gives you different  instructions): 1. Use your normal soap and shampoo to wash your face and hair. 2. Pour the CHG onto a clean washcloth. 3. Starting at your neck, lather your body down to your toes. Make sure you follow these instructions: ? If you will be having surgery, pay special attention to the part of your body where you will be having surgery. Scrub this area for at least 1 minute. ? Do not use CHG on your head or face. If the solution gets into your ears or eyes, rinse them well with water. ? Avoid your genital area. ? Avoid any areas of skin that have broken skin, cuts, or scrapes. ? Scrub your back and under your arms. Make sure to wash skin folds. 4. Let the lather sit on your skin for 1-2 minutes or as long as told by your health care provider. 5. Using a different clean, wet washcloth, thoroughly rinse your entire body. Make sure that all body creases and crevices are rinsed well. 6. Dry off with a clean towel. Do not put any substances on your body afterward--such as powder, lotion, or perfume--unless you are told to do so by your health care provider. Only use lotions that are recommended by the manufacturer. 7. Put on clean clothes or pajamas. 8. If it is the night before your surgery, sleep in clean sheets. How to use CHG prepackaged cloths  Only use CHG cloths as told by your health care provider, and follow the instructions on the label.  Use the CHG cloth on clean, dry skin.  Do not use the CHG cloth on your head or face unless your health care provider tells you to.  When washing with the CHG cloth: ?  Avoid your genital area. ? Avoid any areas of skin that have broken skin, cuts, or scrapes. Before surgery Follow these steps when using a CHG cloth to clean before surgery (unless your health care provider gives you different instructions): 1. Using the CHG cloth, vigorously scrub the part of your body where you will be having surgery. Scrub using a back-and-forth motion for 3 minutes.  The area on your body should be completely wet with CHG when you are done scrubbing. 2. Do not rinse. Discard the cloth and let the area air-dry. Do not put any substances on the area afterward, such as powder, lotion, or perfume. 3. Put on clean clothes or pajamas. 4. If it is the night before your surgery, sleep in clean sheets.  For general bathing Follow these steps when using CHG cloths for general bathing (unless your health care provider gives you different instructions). 1. Use a separate CHG cloth for each area of your body. Make sure you wash between any folds of skin and between your fingers and toes. Wash your body in the following order, switching to a new cloth after each step: ? The front of your neck, shoulders, and chest. ? Both of your arms, under your arms, and your hands. ? Your stomach and groin area, avoiding the genitals. ? Your right leg and foot. ? Your left leg and foot. ? The back of your neck, your back, and your buttocks. 2. Do not rinse. Discard the cloth and let the area air-dry. Do not put any substances on your body afterward--such as powder, lotion, or perfume--unless you are told to do so by your health care provider. Only use lotions that are recommended by the manufacturer. 3. Put on clean clothes or pajamas. Contact a health care provider if:  Your skin gets irritated after scrubbing.  You have questions about using your solution or cloth. Get help right away if:  Your eyes become very red or swollen.  Your eyes itch badly.  Your skin itches badly and is red or swollen.  Your hearing changes.  You have trouble seeing.  You have swelling or tingling in your mouth or throat.  You have trouble breathing.  You swallow any chlorhexidine. Summary  Chlorhexidine gluconate (CHG) is a germ-killing (antiseptic) solution that is used to clean the skin. Cleaning your skin with CHG may help to lower your risk for infection.  You may be given CHG to  use for bathing. It may be in a bottle or in a prepackaged cloth to use on your skin. Carefully follow your health care provider's instructions and the instructions on the product label.  Do not use CHG if you have a chlorhexidine allergy.  Contact your health care provider if your skin gets irritated after scrubbing. This information is not intended to replace advice given to you by your health care provider. Make sure you discuss any questions you have with your health care provider. Document Revised: 04/06/2018 Document Reviewed: 12/16/2016 Elsevier Patient Education  [place].

## 2019-06-14 DIAGNOSIS — M810 Age-related osteoporosis without current pathological fracture: Secondary | ICD-10-CM | POA: Diagnosis not present

## 2019-06-14 DIAGNOSIS — E039 Hypothyroidism, unspecified: Secondary | ICD-10-CM | POA: Diagnosis not present

## 2019-06-14 DIAGNOSIS — N182 Chronic kidney disease, stage 2 (mild): Secondary | ICD-10-CM | POA: Diagnosis not present

## 2019-06-14 DIAGNOSIS — E1122 Type 2 diabetes mellitus with diabetic chronic kidney disease: Secondary | ICD-10-CM | POA: Diagnosis not present

## 2019-06-14 DIAGNOSIS — K8020 Calculus of gallbladder without cholecystitis without obstruction: Secondary | ICD-10-CM | POA: Diagnosis not present

## 2019-06-14 DIAGNOSIS — Z0000 Encounter for general adult medical examination without abnormal findings: Secondary | ICD-10-CM | POA: Diagnosis not present

## 2019-06-14 DIAGNOSIS — E079 Disorder of thyroid, unspecified: Secondary | ICD-10-CM | POA: Diagnosis not present

## 2019-06-14 DIAGNOSIS — E782 Mixed hyperlipidemia: Secondary | ICD-10-CM | POA: Diagnosis not present

## 2019-06-14 DIAGNOSIS — E119 Type 2 diabetes mellitus without complications: Secondary | ICD-10-CM | POA: Diagnosis not present

## 2019-06-18 ENCOUNTER — Other Ambulatory Visit: Payer: Self-pay

## 2019-06-18 ENCOUNTER — Encounter (HOSPITAL_COMMUNITY)
Admission: RE | Admit: 2019-06-18 | Discharge: 2019-06-18 | Disposition: A | Discharge status: Home / Self Care | Payer: Medicare PPO | Source: Ambulatory Visit | Attending: General Surgery | Admitting: General Surgery

## 2019-06-18 ENCOUNTER — Encounter (HOSPITAL_COMMUNITY): Payer: Self-pay

## 2019-06-18 ENCOUNTER — Other Ambulatory Visit (HOSPITAL_COMMUNITY)
Admission: RE | Admit: 2019-06-18 | Discharge: 2019-06-18 | Disposition: A | Discharge status: Home / Self Care | Payer: Medicare PPO | Source: Ambulatory Visit | Attending: General Surgery | Admitting: General Surgery

## 2019-06-18 DIAGNOSIS — Z01812 Encounter for preprocedural laboratory examination: Secondary | ICD-10-CM | POA: Diagnosis not present

## 2019-06-18 DIAGNOSIS — Z20822 Contact with and (suspected) exposure to covid-19: Secondary | ICD-10-CM | POA: Insufficient documentation

## 2019-06-18 LAB — CBC WITH DIFFERENTIAL/PLATELET
Abs Immature Granulocytes: 0.03 10*3/uL (ref 0.00–0.07)
Basophils Absolute: 0.1 10*3/uL (ref 0.0–0.1)
Basophils Relative: 1 %
Eosinophils Absolute: 0.2 10*3/uL (ref 0.0–0.5)
Eosinophils Relative: 3 %
HCT: 33.3 % — ABNORMAL LOW (ref 36.0–46.0)
Hemoglobin: 11.2 g/dL — ABNORMAL LOW (ref 12.0–15.0)
Immature Granulocytes: 0 %
Lymphocytes Relative: 34 %
Lymphs Abs: 2.3 10*3/uL (ref 0.7–4.0)
MCH: 34.9 pg — ABNORMAL HIGH (ref 26.0–34.0)
MCHC: 33.6 g/dL (ref 30.0–36.0)
MCV: 103.7 fL — ABNORMAL HIGH (ref 80.0–100.0)
Monocytes Absolute: 0.8 10*3/uL (ref 0.1–1.0)
Monocytes Relative: 11 %
Neutro Abs: 3.5 10*3/uL (ref 1.7–7.7)
Neutrophils Relative %: 51 %
Platelets: 316 10*3/uL (ref 150–400)
RBC: 3.21 MIL/uL — ABNORMAL LOW (ref 3.87–5.11)
RDW: 14.6 % (ref 11.5–15.5)
WBC: 6.8 10*3/uL (ref 4.0–10.5)
nRBC: 1.2 % — ABNORMAL HIGH (ref 0.0–0.2)

## 2019-06-18 LAB — BASIC METABOLIC PANEL
Anion gap: 9 (ref 5–15)
BUN: 19 mg/dL (ref 8–23)
CO2: 21 mmol/L — ABNORMAL LOW (ref 22–32)
Calcium: 8.7 mg/dL — ABNORMAL LOW (ref 8.9–10.3)
Chloride: 108 mmol/L (ref 98–111)
Creatinine, Ser: 0.87 mg/dL (ref 0.44–1.00)
GFR calc Af Amer: >60 mL/min (ref >60)
GFR calc non Af Amer: >60 mL/min (ref >60)
Glucose, Bld: 127 mg/dL — ABNORMAL HIGH (ref 70–99)
Potassium: 3.8 mmol/L (ref 3.5–5.1)
Sodium: 138 mmol/L (ref 135–145)

## 2019-06-18 LAB — SARS CORONAVIRUS 2 (TAT 6-24 HRS): SARS Coronavirus 2: NEGATIVE

## 2019-06-19 DIAGNOSIS — E1122 Type 2 diabetes mellitus with diabetic chronic kidney disease: Secondary | ICD-10-CM | POA: Diagnosis not present

## 2019-06-19 DIAGNOSIS — Z6830 Body mass index (BMI) 30.0-30.9, adult: Secondary | ICD-10-CM | POA: Diagnosis not present

## 2019-06-19 DIAGNOSIS — M810 Age-related osteoporosis without current pathological fracture: Secondary | ICD-10-CM | POA: Diagnosis not present

## 2019-06-19 DIAGNOSIS — K8020 Calculus of gallbladder without cholecystitis without obstruction: Secondary | ICD-10-CM | POA: Diagnosis not present

## 2019-06-19 DIAGNOSIS — N182 Chronic kidney disease, stage 2 (mild): Secondary | ICD-10-CM | POA: Diagnosis not present

## 2019-06-19 DIAGNOSIS — E6609 Other obesity due to excess calories: Secondary | ICD-10-CM | POA: Diagnosis not present

## 2019-06-19 DIAGNOSIS — E039 Hypothyroidism, unspecified: Secondary | ICD-10-CM | POA: Diagnosis not present

## 2019-06-19 LAB — HEMOGLOBIN A1C
Hgb A1c MFr Bld: 6.5 % — ABNORMAL HIGH (ref 4.8–5.6)
Mean Plasma Glucose: 140 mg/dL

## 2019-06-20 ENCOUNTER — Ambulatory Visit (HOSPITAL_COMMUNITY): Payer: Medicare PPO | Admitting: Certified Registered"

## 2019-06-20 ENCOUNTER — Encounter (HOSPITAL_COMMUNITY)
Admission: RE | Disposition: A | Discharge status: Home / Self Care | Payer: Self-pay | Source: Home / Self Care | Attending: General Surgery

## 2019-06-20 ENCOUNTER — Encounter (HOSPITAL_COMMUNITY): Payer: Self-pay | Admitting: General Surgery

## 2019-06-20 ENCOUNTER — Ambulatory Visit (HOSPITAL_COMMUNITY)
Admission: RE | Admit: 2019-06-20 | Discharge: 2019-06-20 | Disposition: A | Discharge status: Home / Self Care | Payer: Medicare PPO | Attending: General Surgery | Admitting: General Surgery

## 2019-06-20 DIAGNOSIS — Z8249 Family history of ischemic heart disease and other diseases of the circulatory system: Secondary | ICD-10-CM | POA: Diagnosis not present

## 2019-06-20 DIAGNOSIS — E119 Type 2 diabetes mellitus without complications: Secondary | ICD-10-CM | POA: Diagnosis not present

## 2019-06-20 DIAGNOSIS — E039 Hypothyroidism, unspecified: Secondary | ICD-10-CM | POA: Insufficient documentation

## 2019-06-20 DIAGNOSIS — Z87442 Personal history of urinary calculi: Secondary | ICD-10-CM | POA: Insufficient documentation

## 2019-06-20 DIAGNOSIS — M8580 Other specified disorders of bone density and structure, unspecified site: Secondary | ICD-10-CM | POA: Insufficient documentation

## 2019-06-20 DIAGNOSIS — Z87891 Personal history of nicotine dependence: Secondary | ICD-10-CM | POA: Diagnosis not present

## 2019-06-20 DIAGNOSIS — Z833 Family history of diabetes mellitus: Secondary | ICD-10-CM | POA: Insufficient documentation

## 2019-06-20 DIAGNOSIS — Z8719 Personal history of other diseases of the digestive system: Secondary | ICD-10-CM | POA: Insufficient documentation

## 2019-06-20 DIAGNOSIS — K8010 Calculus of gallbladder with chronic cholecystitis without obstruction: Secondary | ICD-10-CM | POA: Diagnosis not present

## 2019-06-20 DIAGNOSIS — K8020 Calculus of gallbladder without cholecystitis without obstruction: Secondary | ICD-10-CM

## 2019-06-20 DIAGNOSIS — M1990 Unspecified osteoarthritis, unspecified site: Secondary | ICD-10-CM | POA: Diagnosis not present

## 2019-06-20 HISTORY — PX: CHOLECYSTECTOMY: SHX55

## 2019-06-20 LAB — GLUCOSE, CAPILLARY
Glucose-Capillary: 131 mg/dL — ABNORMAL HIGH (ref 70–99)
Glucose-Capillary: 149 mg/dL — ABNORMAL HIGH (ref 70–99)

## 2019-06-20 SURGERY — LAPAROSCOPIC CHOLECYSTECTOMY
Anesthesia: General

## 2019-06-20 MED ORDER — FENTANYL CITRATE (PF) 100 MCG/2ML IJ SOLN
25.0000 ug | INTRAMUSCULAR | Status: DC | PRN
  Administered 2019-06-20 (×4): 50 ug via INTRAVENOUS
  Filled 2019-06-20 (×2): qty 2

## 2019-06-20 MED ORDER — SCOPOLAMINE 1 MG/3DAYS TD PT72
MEDICATED_PATCH | TRANSDERMAL | Status: AC
  Filled 2019-06-20: qty 1

## 2019-06-20 MED ORDER — OXYCODONE HCL 5 MG PO TABS: 5.0000 mg | ORAL_TABLET | ORAL | 0 refills | Status: DC | PRN

## 2019-06-20 MED ORDER — ONDANSETRON HCL 4 MG/2ML IJ SOLN
4.0000 mg | Freq: Once | INTRAMUSCULAR | Status: AC | PRN
  Administered 2019-06-20: 4 mg via INTRAVENOUS
  Filled 2019-06-20: qty 2

## 2019-06-20 MED ORDER — CHLORHEXIDINE GLUCONATE CLOTH 2 % EX PADS: 6.0000 | MEDICATED_PAD | Freq: Once | CUTANEOUS | Status: DC

## 2019-06-20 MED ORDER — SUCCINYLCHOLINE CHLORIDE 200 MG/10ML IV SOSY
PREFILLED_SYRINGE | INTRAVENOUS | Status: AC
  Filled 2019-06-20: qty 10

## 2019-06-20 MED ORDER — ROCURONIUM BROMIDE 10 MG/ML (PF) SYRINGE
PREFILLED_SYRINGE | INTRAVENOUS | Status: AC
  Filled 2019-06-20: qty 10

## 2019-06-20 MED ORDER — SUCCINYLCHOLINE CHLORIDE 20 MG/ML IJ SOLN
INTRAMUSCULAR | Status: DC | PRN
  Administered 2019-06-20: 100 mg via INTRAVENOUS

## 2019-06-20 MED ORDER — DEXAMETHASONE SODIUM PHOSPHATE 10 MG/ML IJ SOLN
INTRAMUSCULAR | Status: AC
  Filled 2019-06-20: qty 1

## 2019-06-20 MED ORDER — LACTATED RINGERS IV SOLN
INTRAVENOUS | Status: DC
  Administered 2019-06-20: 1000 mL via INTRAVENOUS

## 2019-06-20 MED ORDER — PROPOFOL 10 MG/ML IV BOLUS
INTRAVENOUS | Status: AC
  Filled 2019-06-20: qty 20

## 2019-06-20 MED ORDER — PHENYLEPHRINE HCL (PRESSORS) 10 MG/ML IV SOLN
INTRAVENOUS | Status: DC | PRN
  Administered 2019-06-20: 80 ug via INTRAVENOUS

## 2019-06-20 MED ORDER — SUGAMMADEX SODIUM 200 MG/2ML IV SOLN
INTRAVENOUS | Status: DC | PRN
Start: 2019-06-20 — End: 2019-06-20
  Administered 2019-06-20: 200 mg via INTRAVENOUS

## 2019-06-20 MED ORDER — PROPOFOL 10 MG/ML IV BOLUS
INTRAVENOUS | Status: DC | PRN
  Administered 2019-06-20: 130 mg via INTRAVENOUS

## 2019-06-20 MED ORDER — SCOPOLAMINE 1 MG/3DAYS TD PT72
1.0000 | MEDICATED_PATCH | TRANSDERMAL | Status: DC
  Administered 2019-06-20: 1.5 mg via TRANSDERMAL
  Filled 2019-06-20: qty 1

## 2019-06-20 MED ORDER — BUPIVACAINE HCL (PF) 0.5 % IJ SOLN
INTRAMUSCULAR | Status: DC | PRN
  Administered 2019-06-20: 10 mL

## 2019-06-20 MED ORDER — BUPIVACAINE HCL (PF) 0.5 % IJ SOLN
INTRAMUSCULAR | Status: AC
  Filled 2019-06-20: qty 30

## 2019-06-20 MED ORDER — DEXAMETHASONE SODIUM PHOSPHATE 10 MG/ML IJ SOLN
INTRAMUSCULAR | Status: DC | PRN
  Administered 2019-06-20: 4 mg via INTRAVENOUS

## 2019-06-20 MED ORDER — DOCUSATE SODIUM 100 MG PO CAPS
100.0000 mg | ORAL_CAPSULE | Freq: Two times a day (BID) | ORAL | 2 refills | Status: DC
Start: 2019-06-20 — End: 2019-07-25

## 2019-06-20 MED ORDER — LIDOCAINE 2% (20 MG/ML) 5 ML SYRINGE
INTRAMUSCULAR | Status: DC | PRN
  Administered 2019-06-20: 80 mg via INTRAVENOUS

## 2019-06-20 MED ORDER — HEMOSTATIC AGENTS (NO CHARGE) OPTIME
TOPICAL | Status: DC | PRN
  Administered 2019-06-20: 1 via TOPICAL

## 2019-06-20 MED ORDER — FENTANYL CITRATE (PF) 100 MCG/2ML IJ SOLN
INTRAMUSCULAR | Status: AC
  Filled 2019-06-20: qty 2

## 2019-06-20 MED ORDER — ONDANSETRON HCL 4 MG/2ML IJ SOLN
INTRAMUSCULAR | Status: AC
  Filled 2019-06-20: qty 2

## 2019-06-20 MED ORDER — FENTANYL CITRATE (PF) 100 MCG/2ML IJ SOLN
INTRAMUSCULAR | Status: DC | PRN
  Administered 2019-06-20 (×4): 25 ug via INTRAVENOUS

## 2019-06-20 MED ORDER — SODIUM CHLORIDE 0.9 % IR SOLN
Status: DC | PRN
  Administered 2019-06-20: 1000 mL

## 2019-06-20 MED ORDER — HYDROMORPHONE HCL 1 MG/ML IJ SOLN
0.2500 mg | INTRAMUSCULAR | Status: DC | PRN
  Administered 2019-06-20 (×2): 0.5 mg via INTRAVENOUS
  Filled 2019-06-20 (×2): qty 0.5

## 2019-06-20 MED ORDER — PHENYLEPHRINE 40 MCG/ML (10ML) SYRINGE FOR IV PUSH (FOR BLOOD PRESSURE SUPPORT)
PREFILLED_SYRINGE | INTRAVENOUS | Status: AC
  Filled 2019-06-20: qty 10

## 2019-06-20 MED ORDER — ONDANSETRON HCL 4 MG/2ML IJ SOLN
INTRAMUSCULAR | Status: DC | PRN
  Administered 2019-06-20: 4 mg via INTRAVENOUS

## 2019-06-20 MED ORDER — SODIUM CHLORIDE 0.9 % IV SOLN
2.0000 g | INTRAVENOUS | Status: AC
  Administered 2019-06-20: 2 g via INTRAVENOUS
  Filled 2019-06-20: qty 2

## 2019-06-20 MED ORDER — ONDANSETRON HCL 4 MG PO TABS: 4.0000 mg | ORAL_TABLET | Freq: Three times a day (TID) | ORAL | 1 refills | Status: DC | PRN

## 2019-06-20 MED ORDER — ROCURONIUM BROMIDE 100 MG/10ML IV SOLN
INTRAVENOUS | Status: DC | PRN
  Administered 2019-06-20: 25 mg via INTRAVENOUS

## 2019-06-20 MED ORDER — LIDOCAINE 2% (20 MG/ML) 5 ML SYRINGE
INTRAMUSCULAR | Status: AC
  Filled 2019-06-20: qty 5

## 2019-06-20 SURGICAL SUPPLY — 45 items
ADH SKN CLS APL DERMABOND .7 (GAUZE/BANDAGES/DRESSINGS) ×1
APL PRP STRL LF DISP 70% ISPRP (MISCELLANEOUS) ×1
APPLIER CLIP ROT 10 11.4 M/L (STAPLE) ×2
APR CLP MED LRG 11.4X10 (STAPLE) ×1
BAG RETRIEVAL 10 (BASKET) ×1
BLADE SURG 15 STRL LF DISP TIS (BLADE) ×1 IMPLANT
BLADE SURG 15 STRL SS (BLADE) ×2
CHLORAPREP W/TINT 26 (MISCELLANEOUS) ×2 IMPLANT
CLIP APPLIE ROT 10 11.4 M/L (STAPLE) ×1 IMPLANT
CLOTH BEACON ORANGE TIMEOUT ST (SAFETY) ×2 IMPLANT
COVER LIGHT HANDLE STERIS (MISCELLANEOUS) ×4 IMPLANT
COVER WAND RF STERILE (DRAPES) ×2 IMPLANT
DECANTER SPIKE VIAL GLASS SM (MISCELLANEOUS) ×2 IMPLANT
DERMABOND ADVANCED (GAUZE/BANDAGES/DRESSINGS) ×1
DERMABOND ADVANCED .7 DNX12 (GAUZE/BANDAGES/DRESSINGS) ×1 IMPLANT
ELECT REM PT RETURN 9FT ADLT (ELECTROSURGICAL) ×2
ELECTRODE REM PT RTRN 9FT ADLT (ELECTROSURGICAL) ×1 IMPLANT
GLOVE BIO SURGEON STRL SZ 6.5 (GLOVE) ×2 IMPLANT
GLOVE BIOGEL PI IND STRL 6.5 (GLOVE) ×1 IMPLANT
GLOVE BIOGEL PI IND STRL 7.0 (GLOVE) ×3 IMPLANT
GLOVE BIOGEL PI INDICATOR 6.5 (GLOVE) ×1
GLOVE BIOGEL PI INDICATOR 7.0 (GLOVE) ×3
GLOVE SURG SS PI 7.5 STRL IVOR (GLOVE) ×2 IMPLANT
GOWN STRL REUS W/TWL LRG LVL3 (GOWN DISPOSABLE) ×6 IMPLANT
HEMOSTAT SNOW SURGICEL 2X4 (HEMOSTASIS) ×2 IMPLANT
INST SET LAPROSCOPIC AP (KITS) ×2 IMPLANT
KIT TURNOVER KIT A (KITS) ×2 IMPLANT
MANIFOLD NEPTUNE II (INSTRUMENTS) ×2 IMPLANT
NDL INSUFFLATION 14GA 120MM (NEEDLE) ×1 IMPLANT
NEEDLE INSUFFLATION 14GA 120MM (NEEDLE) ×2 IMPLANT
NS IRRIG 1000ML POUR BTL (IV SOLUTION) ×2 IMPLANT
PACK LAP CHOLE LZT030E (CUSTOM PROCEDURE TRAY) ×2 IMPLANT
PAD ARMBOARD 7.5X6 YLW CONV (MISCELLANEOUS) ×2 IMPLANT
SET BASIN LINEN APH (SET/KITS/TRAYS/PACK) ×2 IMPLANT
SET TUBE SMOKE EVAC HIGH FLOW (TUBING) ×2 IMPLANT
SLEEVE ENDOPATH XCEL 5M (ENDOMECHANICALS) ×2 IMPLANT
SUT MNCRL AB 4-0 PS2 18 (SUTURE) ×3 IMPLANT
SUT VICRYL 0 UR6 27IN ABS (SUTURE) ×2 IMPLANT
SYS BAG RETRIEVAL 10MM (BASKET) ×1
SYSTEM BAG RETRIEVAL 10MM (BASKET) ×1 IMPLANT
TROCAR ENDO BLADELESS 11MM (ENDOMECHANICALS) ×2 IMPLANT
TROCAR XCEL NON-BLD 5MMX100MML (ENDOMECHANICALS) ×2 IMPLANT
TROCAR XCEL UNIV SLVE 11M 100M (ENDOMECHANICALS) ×2 IMPLANT
TUBE CONNECTING 12X1/4 (SUCTIONS) ×2 IMPLANT
WARMER LAPAROSCOPE (MISCELLANEOUS) ×2 IMPLANT

## 2019-06-20 NOTE — Transfer of Care (Signed)
Immediate Anesthesia Transfer of Care Note  Patient: Kelly Cook  Procedure(s) Performed: LAPAROSCOPIC CHOLECYSTECTOMY (N/A )  Patient Location: PACU  Anesthesia Type:General  Level of Consciousness: awake, alert  and oriented  Airway & Oxygen Therapy: Patient Spontanous Breathing  Post-op Assessment: Report given to RN, Post -op Vital signs reviewed and stable and Patient moving all extremities X 4  Post vital signs: Reviewed  Last Vitals:  Vitals Value Taken Time  BP 138/69 06/20/19 0822  Temp    Pulse 71 06/20/19 0823  Resp 17 06/20/19 0823  SpO2 92 % 06/20/19 0823  Vitals shown include unvalidated device data.  Last Pain:  Vitals:   06/20/19 0642  PainSc: 0-No pain      Patients Stated Pain Goal: 8 ([id number])  Complications: No apparent anesthesia complications

## 2019-06-20 NOTE — Op Note (Signed)
Operative Note   Preoperative Diagnosis: Symptomatic cholelithiasis   Postoperative Diagnosis: Same   Procedure(s) Performed: Laparoscopic cholecystectomy   Surgeon: Ria Comment C. Constance Haw, MD   Assistants: Aviva Signs, MD   Anesthesia: General endotracheal   Anesthesiologist: Dr. Briant Cedar, MD    Specimens: Gallbladder    Estimated Blood Loss: Minimal    Blood Replacement: None    Complications: None    Operative Findings:  Gallbladder with large stone    Procedure: The patient was taken to the operating room and placed supine. General endotracheal anesthesia was induced. Intravenous antibiotics were  administered per protocol. An orogastric tube positioned to decompress the stomach. The abdomen was prepared and draped in the usual sterile fashion.    A supraumbilical incision was made and a Veress technique was utilized to achieve pneumoperitoneum to 15 mmHg with carbon dioxide. A 11 mm optiview port was placed through the supraumbilical region, and a 10 mm 0-degree operative laparoscope was introduced. The area underlying the trocar and Veress needle were inspected and without evidence of injury.  Remaining trocars were placed under direct vision. Two 5 mm ports were placed in the right abdomen, between the anterior axillary and midclavicular line.  A final 11 mm port was placed through the mid-epigastrium, near the falciform ligament.    The gallbladder fundus was elevated cephalad and the infundibulum was retracted to the patient's right. The gallbladder/cystic duct junction was skeletonized. The cystic artery noted in the triangle of Calot and was also skeletonized.  We then continued liberal medial and lateral dissection until the critical view of safety was achieved.    The cystic duct and cystic artery were doubly clipped and divided. There was a small posterior branch < 1 mm that seem to branch behind the gallbladder in the mesentery. This was doubly clipped and divided. The  gallbladder was then dissected from the liver bed with electrocautery. The specimen was placed in an Endopouch and was retrieved through the epigastric site.   Final inspection revealed acceptable hemostasis. Surgical SNOW was placed in the gallbladder bed.  Trocars were removed and pneumoperitoneum was released.  0 Vicryl fascial sutures were used to close the epigastric site and the umbilical site was smaller than my finger. Skin incisions were closed with 4-0 Monocryl subcuticular sutures and Dermabond. The patient was awakened from anesthesia and extubated without complication.    Curlene Labrum, MD [facility] [address] [place], [place] [address] [phone] (office)

## 2019-06-20 NOTE — Addendum Note (Signed)
Addendum  created 06/20/19 1006 by Louann Sjogren, MD   Order list changed

## 2019-06-20 NOTE — Progress Notes (Signed)
  While in Phase 2 recovery patient had 3 episodes of emesis, mostly small amounts of bile, Some improvement with aromatherapy of alcohol prep and peppermint oil, Prior to departure to emesis and dry heaving. Dr. Adalberto Ill (anesthesia) notified. Dr. Adalberto Ill at bedside to assess patient, verbal order for scope patch received and administered with patient education to patient and husband.. Patient then able to go to car and be discharged. Prior to scope patch. Patient had had a total of zofran 8mg  and decadron.

## 2019-06-20 NOTE — Progress Notes (Signed)
[facility]  Spoke with husband. Rx sent to pharmacy.  Curlene Labrum, MD [facility] [address] [place], [place] [address] [phone] (office)

## 2019-06-20 NOTE — Anesthesia Procedure Notes (Signed)
Procedure Name: Intubation Performed by: Tacy Learn, CRNA Pre-anesthesia Checklist: Patient identified, Emergency Drugs available, Suction available, Patient being monitored and Timeout performed Patient Re-evaluated:Patient Re-evaluated prior to induction Oxygen Delivery Method: Circle system utilized Preoxygenation: Pre-oxygenation with 100% oxygen Induction Type: IV induction Laryngoscope Size: Miller and 2 Grade View: Grade II Tube type: Oral Tube size: 7.0 mm Number of attempts: 1 Airway Equipment and Method: Stylet Placement Confirmation: ETT inserted through vocal cords under direct vision,  positive ETCO2,  CO2 detector and breath sounds checked- equal and bilateral Secured at: 21 cm

## 2019-06-20 NOTE — Anesthesia Preprocedure Evaluation (Signed)
Anesthesia Evaluation  Patient identified by MRN, date of birth, ID band Patient awake    Reviewed: Allergy & Precautions, H&P , NPO status , Patient's Chart, lab work & pertinent test results, reviewed documented beta blocker date and time   Airway Mallampati: II  TM Distance: <3 FB Neck ROM: full    Dental no notable dental hx. (+) Partial Upper, Partial Lower   Pulmonary neg pulmonary ROS, former smoker,    Pulmonary exam normal breath sounds clear to auscultation       Cardiovascular Exercise Tolerance: Good negative cardio ROS   Rhythm:regular Rate:Normal     Neuro/Psych negative neurological ROS  negative psych ROS   GI/Hepatic negative GI ROS, Neg liver ROS,   Endo/Other  diabetesHypothyroidism   Renal/GU negative Renal ROS  negative genitourinary   Musculoskeletal   Abdominal   Peds  Hematology negative hematology ROS (+)   Anesthesia Other Findings   Reproductive/Obstetrics negative OB ROS                             Anesthesia Physical Anesthesia Plan  ASA: II  Anesthesia Plan: General   Post-op Pain Management:    Induction:   PONV Risk Score and Plan: 3 and Ondansetron  Airway Management Planned:   Additional Equipment:   Intra-op Plan:   Post-operative Plan:   Informed Consent: I have reviewed the patients History and Physical, chart, labs and discussed the procedure including the risks, benefits and alternatives for the proposed anesthesia with the patient or authorized representative who has indicated his/her understanding and acceptance.     Dental Advisory Given  Plan Discussed with: CRNA  Anesthesia Plan Comments:         Anesthesia Quick Evaluation

## 2019-06-20 NOTE — Interval H&P Note (Signed)
History and Physical Interval Note:  06/20/2019 7:17 AM  Kelly Cook  has presented today for surgery, with the diagnosis of Cholelithiasis.  The various methods of treatment have been discussed with the patient and family. After consideration of risks, benefits and other options for treatment, the patient has consented to  Procedure(s): LAPAROSCOPIC CHOLECYSTECTOMY (N/A) as a surgical intervention.  The patient's history has been reviewed, patient examined, no change in status, stable for surgery.  I have reviewed the patient's chart and labs.  Questions were answered to the patient's satisfaction.    No changes.  Virl Cagey

## 2019-06-20 NOTE — Discharge Instructions (Signed)
Discharge Laparoscopic Surgery Instructions:  Common Complaints: Right shoulder pain is common after laparoscopic surgery. This is secondary to the gas used in the surgery being trapped under the diaphragm.  Walk to help your body absorb the gas. This will improve in a few days. Pain at the port sites are common, especially the larger port sites. This will improve with time.  Some nausea is common and poor appetite. The main goal is to stay hydrated the first few days after surgery.   Diet/ Activity: Diet as tolerated. You may not have an appetite, but it is important to stay hydrated. Drink 64 ounces of water a day. Your appetite will return with time.  Shower per your regular routine daily.  Do not take hot showers. Take warm showers that are less than 10 minutes. Rest and listen to your body, but do not remain in bed all day.  Walk everyday for at least 15-20 minutes. Deep cough and move around every 1-2 hours in the first few days after surgery.  Do not lift > 10 lbs, perform excessive bending, pushing, pulling, squatting for 1-2 weeks after surgery.  Do not pick at the dermabond glue on your incision sites.  This glue film will remain in place for 1-2 weeks and will start to peel off.  Do not place lotions or balms on your incision unless instructed to specifically by Dr. Bridges.   Pain Expectations and Narcotics: -After surgery you will have pain associated with your incisions and this is normal. The pain is muscular and nerve pain, and will get better with time. -You are encouraged and expected to take non narcotic medications like tylenol and ibuprofen (when able) to treat pain as multiple modalities can aid with pain treatment. -Narcotics are only used when pain is severe or there is breakthrough pain. -You are not expected to have a pain score of 0 after surgery, as we cannot prevent pain. A pain score of 3-4 that allows you to be functional, move, walk, and tolerate some activity is  the goal. The pain will continue to improve over the days after surgery and is dependent on your surgery. -Due to [place] law, we are only able to give a certain amount of pain medication to treat post operative pain, and we only give additional narcotics on a patient by patient basis.  -For most laparoscopic surgery, studies have shown that the majority of patients only need 10-15 narcotic pills, and for open surgeries most patients only need 15-20.   -Having appropriate expectations of pain and knowledge of pain management with non narcotics is important as we do not want anyone to become addicted to narcotic pain medication.  -Using ice packs in the first 48 hours and heating pads after 48 hours, wearing an abdominal binder (when recommended), and using over the counter medications are all ways to help with pain management.   -Simple acts like meditation and mindfulness practices after surgery can also help with pain control and research has proven the benefit of these practices.  Medication: Take tylenol and ibuprofen as needed for pain control, alternating every 4-6 hours.  Example:  Tylenol 1000mg @ 6am, 12noon, 6pm, 12midnight (Do not exceed 4000mg of tylenol a day). Ibuprofen 800mg @ 9am, 3pm, 9pm, 3am (Do not exceed 3600mg of ibuprofen a day).  Take Roxicodone for breakthrough pain every 4 hours.  Take Colace for constipation related to narcotic pain medication. If you do not have a bowel movement in 2 days, take Miralax   over the counter.  Drink plenty of water to also prevent constipation.   Contact Information: If you have questions or concerns, please call our office, [phone], Monday- Thursday 8AM-5PM and Friday 8AM-12Noon.  If it is after hours or on the weekend, please call [facility] Main [facility], [phone], and ask to speak to the surgeon on call for Dr. Bridges at [place].    Laparoscopic Cholecystectomy, Care After This sheet gives you information about how to care for  yourself after your procedure. Your doctor may also give you more specific instructions. If you have problems or questions, contact your doctor. Follow these instructions at home: Care for cuts from surgery (incisions)   Follow instructions from your doctor about how to take care of your cuts from surgery. Make sure you: ? Wash your hands with soap and water before you change your bandage (dressing). If you cannot use soap and water, use hand sanitizer. ? Change your bandage as told by your doctor. ? Leave stitches (sutures), skin glue, or skin tape (adhesive) strips in place. They may need to stay in place for 2 weeks or longer. If tape strips get loose and curl up, you may trim the loose edges. Do not remove tape strips completely unless your doctor says it is okay.  Do not take baths, swim, or use a hot tub until your doctor says it is okay.  You may shower.  Check your surgical cut area every day for signs of infection. Check for: ? More redness, swelling, or pain. ? More fluid or blood. ? Warmth. ? Pus or a bad smell. Activity  Do not drive or use heavy machinery while taking prescription pain medicine.  Do not lift anything that is heavier than 10 lb (4.5 kg) until your doctor says it is okay.  Do not play contact sports until your doctor says it is okay.  Do not drive for 24 hours if you were given a medicine to help you relax (sedative).  Rest as needed. Do not return to work or school until your doctor says it is okay. General instructions  Take over-the-counter and prescription medicines only as told by your doctor.  To prevent or treat constipation while you are taking prescription pain medicine, your doctor may recommend that you: ? Drink enough fluid to keep your pee (urine) clear or pale yellow. ? Take over-the-counter or prescription medicines. ? Eat foods that are high in fiber, such as fresh fruits and vegetables, whole grains, and beans. ? Limit foods that are  high in fat and processed sugars, such as fried and sweet foods. Contact a doctor if:  You develop a rash.  You have more redness, swelling, or pain around your surgical cuts.  You have more fluid or blood coming from your surgical cuts.  Your surgical cuts feel warm to the touch.  You have pus or a bad smell coming from your surgical cuts.  You have a fever.  One or more of your surgical cuts breaks open. Get help right away if:  You have trouble breathing.  You have chest pain.  You have pain that is getting worse in your shoulders.  You faint or feel dizzy when you stand.  You have very bad pain in your belly (abdomen).  You are sick to your stomach (nauseous) for more than one day.  You have throwing up (vomiting) that lasts for more than one day.  You have leg pain. This information is not intended to replace advice   given to you by your health care provider. Make sure you discuss any questions you have with your health care provider. Document Revised: 12/31/2016 Document Reviewed: 07/07/2015 Elsevier Patient Education  2020 Elsevier Inc.     General Anesthesia, Adult, Care After This sheet gives you information about how to care for yourself after your procedure. Your health care provider may also give you more specific instructions. If you have problems or questions, contact your health care provider. What can I expect after the procedure? After the procedure, the following side effects are common:  Pain or discomfort at the IV site.  Nausea.  Vomiting.  Sore throat.  Trouble concentrating.  Feeling cold or chills.  Weak or tired.  Sleepiness and fatigue.  Soreness and body aches. These side effects can affect parts of the body that were not involved in surgery. Follow these instructions at home:  For at least 24 hours after the procedure:  Have a responsible adult stay with you. It is important to have someone help care for you until you are  awake and alert.  Rest as needed.  Do not: ? Participate in activities in which you could fall or become injured. ? Drive. ? Use heavy machinery. ? Drink alcohol. ? Take sleeping pills or medicines that cause drowsiness. ? Make important decisions or sign legal documents. ? Take care of children on your own. Eating and drinking  Follow any instructions from your health care provider about eating or drinking restrictions.  When you feel hungry, start by eating small amounts of foods that are soft and easy to digest (bland), such as toast. Gradually return to your regular diet.  Drink enough fluid to keep your urine pale yellow.  If you vomit, rehydrate by drinking water, juice, or clear broth. General instructions  If you have sleep apnea, surgery and certain medicines can increase your risk for breathing problems. Follow instructions from your health care provider about wearing your sleep device: ? Anytime you are sleeping, including during daytime naps. ? While taking prescription pain medicines, sleeping medicines, or medicines that make you drowsy.  Return to your normal activities as told by your health care provider. Ask your health care provider what activities are safe for you.  Take over-the-counter and prescription medicines only as told by your health care provider.  If you smoke, do not smoke without supervision.  Keep all follow-up visits as told by your health care provider. This is important. Contact a health care provider if:  You have nausea or vomiting that does not get better with medicine.  You cannot eat or drink without vomiting.  You have pain that does not get better with medicine.  You are unable to pass urine.  You develop a skin rash.  You have a fever.  You have redness around your IV site that gets worse. Get help right away if:  You have difficulty breathing.  You have chest pain.  You have blood in your urine or stool, or you vomit  blood. Summary  After the procedure, it is common to have a sore throat or nausea. It is also common to feel tired.  Have a responsible adult stay with you for the first 24 hours after general anesthesia. It is important to have someone help care for you until you are awake and alert.  When you feel hungry, start by eating small amounts of foods that are soft and easy to digest (bland), such as toast. Gradually return to your regular   diet.  Drink enough fluid to keep your urine pale yellow.  Return to your normal activities as told by your health care provider. Ask your health care provider what activities are safe for you. This information is not intended to replace advice given to you by your health care provider. Make sure you discuss any questions you have with your health care provider. Document Revised: 01/21/2017 Document Reviewed: 09/03/2016 Elsevier Patient Education  2020 Elsevier Inc.  

## 2019-06-20 NOTE — Progress Notes (Signed)
Attempted to get patient up and she started having real bad pain. Patient stated pain level to be 10 out of 10 and I placed her back in bed. Called Dr. Briant Cedar and he ordered some Dilaudid because she had been getting Fentanyl.

## 2019-06-20 NOTE — Anesthesia Postprocedure Evaluation (Signed)
Anesthesia Post Note  Patient: Kelly Cook  Procedure(s) Performed: LAPAROSCOPIC CHOLECYSTECTOMY (N/A )  Patient location during evaluation: PACU Anesthesia Type: General Level of consciousness: awake and alert and oriented Pain management: pain level controlled Vital Signs Assessment: post-procedure vital signs reviewed and stable Respiratory status: spontaneous breathing Cardiovascular status: blood pressure returned to baseline and stable Postop Assessment: no apparent nausea or vomiting Anesthetic complications: no     Last Vitals:  Vitals:   06/20/19 0915 06/20/19 0930  BP: (!) 164/68 (!) 151/69  Pulse: 67 79  Resp: 18 13  Temp:    SpO2: 100% 100%    Last Pain:  Vitals:   06/20/19 0930  PainSc: 3                  Ajanae Virag

## 2019-06-21 ENCOUNTER — Telehealth: Payer: Self-pay

## 2019-06-21 LAB — SURGICAL PATHOLOGY

## 2019-06-21 NOTE — Telephone Encounter (Signed)
Patient called and had gallbladder surgery yesterday with Dr Constance Haw. She reports that she is in a lot of pain. She has been taking her prescribed Oxycodone and some tylenol. She reports pain in her abdomen and up into her shoulder. She reports no fever, chills, or vomiting. She does report some nausea when the pain gets bad.  Patient instructed to start taking two extra strength Tylenol every 8 hours and rotate that with 600 mg of Ibuprofen every 8 hours. She may take this in addition to her Oxycodone. She was also instructed to start taking a stool softener to prevent constipation.  She will continue to walk to try to work out the gas and also will start using ice to the surgical area several times a day.  Patient instructed to call back if the pain remains uncontrolled or if she starts vomiting or develops a fever.

## 2019-06-22 ENCOUNTER — Other Ambulatory Visit (HOSPITAL_COMMUNITY): Payer: Self-pay | Admitting: Adult Health Nurse Practitioner

## 2019-06-22 DIAGNOSIS — Z1231 Encounter for screening mammogram for malignant neoplasm of breast: Secondary | ICD-10-CM

## 2019-06-25 ENCOUNTER — Telehealth: Payer: Self-pay | Admitting: Family Medicine

## 2019-06-25 NOTE — Telephone Encounter (Signed)
[facility]  Agree showers for now. No baths or submerging for 4 weeks post op.  Curlene Labrum, MD [facility] [address] [place], [place] [address] [phone] (office)

## 2019-06-25 NOTE — Telephone Encounter (Signed)
Pt called and LMOVM wanting to know if she can take a bath and if she had a follow up apt. Called and spoke to pt and informed that she can take a shower but not to soak in a tub of water and pat area dry. Also pt was given F/U phone apt date.

## 2019-07-05 ENCOUNTER — Telehealth (INDEPENDENT_AMBULATORY_CARE_PROVIDER_SITE_OTHER): Payer: Self-pay | Admitting: General Surgery

## 2019-07-05 DIAGNOSIS — K8020 Calculus of gallbladder without cholecystitis without obstruction: Secondary | ICD-10-CM

## 2019-07-05 NOTE — Progress Notes (Signed)
[facility]  I am calling the patient for post operative evaluation due to the current COVID 19 pandemic.  The patient had a laparoscopic cholecystectomy on 06/20/2019. The patient reports that they are doing well. The are tolerating a diet, having good pain control, and having regular Bms.  The patient has no concerns. Incisions are healing well.   Will see the patient PRN.   Pathology: FINAL MICROSCOPIC DIAGNOSIS:   A. GALLBLADDER, CHOLECYSTECTOMY:  - Chronic cholecystitis and cholelithiasis  Curlene Labrum, MD [facility] [address] [place], [place] [address] [phone] (office)

## 2019-07-25 ENCOUNTER — Other Ambulatory Visit: Payer: Self-pay

## 2019-07-25 ENCOUNTER — Ambulatory Visit: Payer: Medicare PPO | Admitting: Cardiology

## 2019-07-25 ENCOUNTER — Encounter: Payer: Self-pay | Admitting: Cardiology

## 2019-07-25 VITALS — BP 112/52 | HR 101 | Ht 61.0 in | Wt 157.0 lb

## 2019-07-25 DIAGNOSIS — R0789 Other chest pain: Secondary | ICD-10-CM

## 2019-07-25 NOTE — Progress Notes (Signed)
Clinical Summary Kelly Cook is a 79 y.o.female seen as new consult, referred by Dr Nevada Crane for the following medical problems.  1. Chest pain - ER visit 05/2019 with chest pain - trop neg x 2. EKG SR with PACs, no ischemic changes. CXR no acute process - was found to have large gallstone, s/p lab chole 06/2019    - chest pains has resolved since gallbladder surgery - CAD risk factors: DM2, former smoker x 25 years  - wave of pain right chest, 5/10 in severity. No other associated. Would occur at rest or with exertion. Would last about 1 hour. Not positional - walks 2 mile trial 2-3 times a week.      Past Medical History:  Diagnosis Date  . 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 Constipation 06/16/2012  . Diabetes mellitus   . Gall bladder stones   . Hemorrhoids   . History of kidney stones   . Hypothyroid 06/16/2012  . Hypothyroid 06/16/2012  . Osteopenia   . Osteoporosis   . Otitis externa 12/01/2012  . Shingles   . Urinary incontinence 07/02/2015     No Known Allergies   Current Outpatient Medications  Medication Sig Dispense Refill  . 1st Choice Lancets Ultra Thin MISC     . ACCU-CHEK AVIVA PLUS test strip USE TO TEST BLOOD SUGAR EVERY DAY    . acetaminophen (TYLENOL) 500 MG tablet Take 1,000 mg by mouth every 6 (six) hours as needed for moderate pain.     Marland Kitchen ascorbic acid (VITAMIN C) 500 MG tablet Take 500 mg by mouth at bedtime.    . Cholecalciferol (DIALYVITE VITAMIN D 5000) 125 MCG (5000 UT) capsule Take 5,000 Units by mouth at bedtime.    . docusate sodium (COLACE) 100 MG capsule Take 1 capsule (100 mg total) by mouth 2 (two) times daily. 60 capsule 2  . levothyroxine (SYNTHROID) 88 MCG tablet Take 88 mcg by mouth daily before breakfast.     . metFORMIN (GLUCOPHAGE-XR) 500 MG 24 hr tablet Take 500 mg by mouth every evening.    . Olopatadine HCl (EYE ALLERGY ITCH/REDNESS REL OP) Place 1 drop  into both eyes daily as needed (itching/redness).    . ondansetron (ZOFRAN) 4 MG tablet Take 1 tablet (4 mg total) by mouth every 8 (eight) hours as needed for nausea or vomiting. 30 tablet 1  . oxyCODONE (ROXICODONE) 5 MG immediate release tablet Take 1 tablet (5 mg total) by mouth every 4 (four) hours as needed for severe pain or breakthrough pain. 10 tablet 0   No current facility-administered medications for this visit.     Past Surgical History:  Procedure Laterality Date  . ABDOMINAL HYSTERECTOMY     dysfunctional uterine bleeding  . APPENDECTOMY    . CATARACT EXTRACTION W/PHACO Left 11/14/2017   Procedure: CATARACT EXTRACTION PHACO AND INTRAOCULAR LENS PLACEMENT (IOC);  Surgeon: Tonny Jewelia Bocchino, MD;  Location: [facility];  Service: Ophthalmology;  Laterality: Left;  CDE: 9.02  . CATARACT EXTRACTION W/PHACO Right 11/28/2017   Procedure: CATARACT EXTRACTION PHACO AND INTRAOCULAR LENS PLACEMENT RIGHT EYE CDE=10.42;  Surgeon: Tonny Yuma Pacella, MD;  Location: [facility];  Service: Ophthalmology;  Laterality: Right;  right  . CHOLECYSTECTOMY N/A 06/20/2019   Procedure: LAPAROSCOPIC CHOLECYSTECTOMY;  Surgeon: Virl Cagey, MD;  Location: [facility];  Service: General;  Laterality: N/A;  . COLONOSCOPY N/A 11/27/2015   Procedure: COLONOSCOPY;  Surgeon:  Rogene Houston, MD;  Location: [facility];  Service: Endoscopy;  Laterality: N/A;  730  . EXTRACORPOREAL SHOCK WAVE LITHOTRIPSY Left 11/01/2016   Procedure: LEFT EXTRACORPOREAL SHOCK WAVE LITHOTRIPSY (ESWL);  Surgeon: Festus Aloe, MD;  Location: [facility];  Service: Urology;  Laterality: Left;  . salivary tumor    . TONSILLECTOMY     adenoids  . TUBAL LIGATION       No Known Allergies    Family History  Problem Relation Age of Onset  . Cancer Father   . Dementia Mother   . Diabetes Brother   . Heart disease Brother   . Mental retardation Daughter        in group home     Social History Ms. Sconyers reports that she has quit smoking. Her  smoking use included cigarettes. She quit after 24.00 years of use. She has never used smokeless tobacco. Ms. Schoenberger reports no history of alcohol use.   Review of Systems CONSTITUTIONAL: No weight loss, fever, chills, weakness or fatigue.  HEENT: Eyes: No visual loss, blurred vision, double vision or yellow sclerae.No hearing loss, sneezing, congestion, runny nose or sore throat.  SKIN: No rash or itching.  CARDIOVASCULAR: per hpi RESPIRATORY: No shortness of breath, cough or sputum.  GASTROINTESTINAL: No anorexia, nausea, vomiting or diarrhea. No abdominal pain or blood.  GENITOURINARY: No burning on urination, no polyuria NEUROLOGICAL: No headache, dizziness, syncope, paralysis, ataxia, numbness or tingling in the extremities. No change in bowel or bladder control.  MUSCULOSKELETAL: No muscle, back pain, joint pain or stiffness.  LYMPHATICS: No enlarged nodes. No history of splenectomy.  PSYCHIATRIC: No history of depression or anxiety.  ENDOCRINOLOGIC: No reports of sweating, cold or heat intolerance. No polyuria or polydipsia.  Marland Kitchen   Physical Examination Today's Vitals   07/25/19 0904  BP: (!) 112/52  Pulse: (!) 101  SpO2: 95%  Weight: 157 lb (71.2 kg)  Height: 5\' 1"  (1.549 m)   Body mass index is 29.66 kg/m.  Gen: resting comfortably, no acute distress HEENT: no scleral icterus, pupils equal round and reactive, no palptable cervical adenopathy,  CV: RRR, no m/r/g, no jvd Resp: Clear to auscultation bilaterally GI: abdomen is soft, non-tender, non-distended, normal bowel sounds, no hepatosplenomegaly MSK: extremities are warm, no edema.  Skin: warm, no rash Neuro:  no focal deficits Psych: appropriate affect     Assessment and Plan  1. Chest pain - atypical right sided symptoms that have resolved since her lap chole - walks 2-3 mile trail regularly without any exertional symptoms - no plans for ischemic testing at this time, if recurrent or exertional symptoms would  reconsider stress testing at that time.    F/u as needed  Arnoldo Lenis, M.D.

## 2019-07-25 NOTE — Patient Instructions (Signed)
Medication Instructions:  Your physician recommends that you continue on your current medications as directed. Please refer to the Current Medication list given to you today.  *If you need a refill on your cardiac medications before your next appointment, please call your pharmacy*   Lab Work: NONE   If you have labs (blood work) drawn today and your tests are completely normal, you will receive your results only by: Marland Kitchen MyChart Message (if you have MyChart) OR . A paper copy in the mail If you have any lab test that is abnormal or we need to change your treatment, we will call you to review the results.   Testing/Procedures: NONE   Follow-Up: At [facility], you and your health needs are our priority.  As part of our continuing mission to provide you with exceptional heart care, we have created designated Provider Care Teams.  These Care Teams include your primary Cardiologist (physician) and Advanced Practice Providers (APPs -  Physician Assistants and Nurse Practitioners) who all work together to provide you with the care you need, when you need it.  We recommend signing up for the patient portal called "MyChart".  Sign up information is provided on this After Visit Summary.  MyChart is used to connect with patients for Virtual Visits (Telemedicine).  Patients are able to view lab/test results, encounter notes, upcoming appointments, etc.  Non-urgent messages can be sent to your provider as well.   To learn more about what you can do with MyChart, go to [email or web address].    Your next appointment:    As Needed   The format for your next appointment:   Either In Person or Virtual  Provider:   Carlyle Dolly, MD   Other Instructions Thank you for choosing [facility]!

## 2019-08-15 ENCOUNTER — Ambulatory Visit (HOSPITAL_COMMUNITY): Payer: Medicare PPO

## 2019-08-23 ENCOUNTER — Other Ambulatory Visit: Payer: Self-pay

## 2019-08-23 ENCOUNTER — Ambulatory Visit (HOSPITAL_COMMUNITY)
Admission: RE | Admit: 2019-08-23 | Discharge: 2019-08-23 | Disposition: A | Discharge status: Home / Self Care | Payer: Medicare PPO | Source: Ambulatory Visit | Attending: Adult Health Nurse Practitioner | Admitting: Adult Health Nurse Practitioner

## 2019-08-23 DIAGNOSIS — Z1231 Encounter for screening mammogram for malignant neoplasm of breast: Secondary | ICD-10-CM

## 2019-10-11 ENCOUNTER — Ambulatory Visit: Payer: Self-pay

## 2019-10-18 ENCOUNTER — Ambulatory Visit: Payer: Medicare PPO | Attending: Internal Medicine

## 2019-10-18 DIAGNOSIS — Z23 Encounter for immunization: Secondary | ICD-10-CM

## 2019-10-18 NOTE — Progress Notes (Signed)
   Covid-19 Vaccination Clinic  Name:  Kelly Cook    MRN: [id number] DOB: 06-24-1940  10/18/2019  [name] was observed post Covid-19 immunization for 15 minutes without incident. She was provided with Vaccine Information Sheet and instruction to access the V-Safe system.   [name] was instructed to call 911 with any severe reactions post vaccine: Marland Kitchen Difficulty breathing  . Swelling of face and throat  . A fast heartbeat  . A bad rash all over body  . Dizziness and weakness

## 2019-12-20 DIAGNOSIS — E119 Type 2 diabetes mellitus without complications: Secondary | ICD-10-CM | POA: Diagnosis not present

## 2019-12-20 DIAGNOSIS — E782 Mixed hyperlipidemia: Secondary | ICD-10-CM | POA: Diagnosis not present

## 2019-12-20 DIAGNOSIS — K8020 Calculus of gallbladder without cholecystitis without obstruction: Secondary | ICD-10-CM | POA: Diagnosis not present

## 2019-12-20 DIAGNOSIS — Z23 Encounter for immunization: Secondary | ICD-10-CM | POA: Diagnosis not present

## 2019-12-20 DIAGNOSIS — Z0000 Encounter for general adult medical examination without abnormal findings: Secondary | ICD-10-CM | POA: Diagnosis not present

## 2019-12-20 DIAGNOSIS — R600 Localized edema: Secondary | ICD-10-CM | POA: Diagnosis not present

## 2019-12-20 DIAGNOSIS — R079 Chest pain, unspecified: Secondary | ICD-10-CM | POA: Diagnosis not present

## 2019-12-20 DIAGNOSIS — M810 Age-related osteoporosis without current pathological fracture: Secondary | ICD-10-CM | POA: Diagnosis not present

## 2019-12-20 DIAGNOSIS — E079 Disorder of thyroid, unspecified: Secondary | ICD-10-CM | POA: Diagnosis not present

## 2019-12-25 DIAGNOSIS — Z6830 Body mass index (BMI) 30.0-30.9, adult: Secondary | ICD-10-CM | POA: Diagnosis not present

## 2019-12-25 DIAGNOSIS — Z0001 Encounter for general adult medical examination with abnormal findings: Secondary | ICD-10-CM | POA: Diagnosis not present

## 2019-12-25 DIAGNOSIS — E6609 Other obesity due to excess calories: Secondary | ICD-10-CM | POA: Diagnosis not present

## 2019-12-25 DIAGNOSIS — Z23 Encounter for immunization: Secondary | ICD-10-CM | POA: Diagnosis not present

## 2019-12-25 DIAGNOSIS — M810 Age-related osteoporosis without current pathological fracture: Secondary | ICD-10-CM | POA: Diagnosis not present

## 2019-12-25 DIAGNOSIS — N182 Chronic kidney disease, stage 2 (mild): Secondary | ICD-10-CM | POA: Diagnosis not present

## 2019-12-25 DIAGNOSIS — Z0000 Encounter for general adult medical examination without abnormal findings: Secondary | ICD-10-CM | POA: Diagnosis not present

## 2019-12-25 DIAGNOSIS — E039 Hypothyroidism, unspecified: Secondary | ICD-10-CM | POA: Diagnosis not present

## 2019-12-25 DIAGNOSIS — K8020 Calculus of gallbladder without cholecystitis without obstruction: Secondary | ICD-10-CM | POA: Diagnosis not present

## 2019-12-25 DIAGNOSIS — E1122 Type 2 diabetes mellitus with diabetic chronic kidney disease: Secondary | ICD-10-CM | POA: Diagnosis not present

## 2020-01-10 DIAGNOSIS — E119 Type 2 diabetes mellitus without complications: Secondary | ICD-10-CM | POA: Diagnosis not present

## 2020-03-11 DIAGNOSIS — Z0000 Encounter for general adult medical examination without abnormal findings: Secondary | ICD-10-CM | POA: Diagnosis not present

## 2020-06-19 DIAGNOSIS — E119 Type 2 diabetes mellitus without complications: Secondary | ICD-10-CM | POA: Diagnosis not present

## 2020-06-19 DIAGNOSIS — E039 Hypothyroidism, unspecified: Secondary | ICD-10-CM | POA: Diagnosis not present

## 2020-06-19 DIAGNOSIS — D649 Anemia, unspecified: Secondary | ICD-10-CM | POA: Diagnosis not present

## 2020-06-19 DIAGNOSIS — E782 Mixed hyperlipidemia: Secondary | ICD-10-CM | POA: Diagnosis not present

## 2020-06-19 DIAGNOSIS — E1122 Type 2 diabetes mellitus with diabetic chronic kidney disease: Secondary | ICD-10-CM | POA: Diagnosis not present

## 2020-06-23 DIAGNOSIS — E1122 Type 2 diabetes mellitus with diabetic chronic kidney disease: Secondary | ICD-10-CM | POA: Insufficient documentation

## 2020-06-24 DIAGNOSIS — E039 Hypothyroidism, unspecified: Secondary | ICD-10-CM | POA: Diagnosis not present

## 2020-06-24 DIAGNOSIS — K8020 Calculus of gallbladder without cholecystitis without obstruction: Secondary | ICD-10-CM | POA: Diagnosis not present

## 2020-06-24 DIAGNOSIS — E1122 Type 2 diabetes mellitus with diabetic chronic kidney disease: Secondary | ICD-10-CM | POA: Diagnosis not present

## 2020-06-24 DIAGNOSIS — M810 Age-related osteoporosis without current pathological fracture: Secondary | ICD-10-CM | POA: Diagnosis not present

## 2020-06-24 DIAGNOSIS — I6529 Occlusion and stenosis of unspecified carotid artery: Secondary | ICD-10-CM | POA: Diagnosis not present

## 2020-06-24 DIAGNOSIS — D649 Anemia, unspecified: Secondary | ICD-10-CM | POA: Diagnosis not present

## 2020-06-24 DIAGNOSIS — E669 Obesity, unspecified: Secondary | ICD-10-CM | POA: Diagnosis not present

## 2020-07-29 ENCOUNTER — Other Ambulatory Visit (HOSPITAL_COMMUNITY): Payer: Self-pay | Admitting: Internal Medicine

## 2020-07-29 DIAGNOSIS — Z1231 Encounter for screening mammogram for malignant neoplasm of breast: Secondary | ICD-10-CM

## 2020-08-25 ENCOUNTER — Ambulatory Visit (HOSPITAL_COMMUNITY)
Admission: RE | Admit: 2020-08-25 | Discharge: 2020-08-25 | Disposition: A | Discharge status: Home / Self Care | Payer: Medicare PPO | Source: Ambulatory Visit | Attending: Internal Medicine | Admitting: Internal Medicine

## 2020-08-25 ENCOUNTER — Other Ambulatory Visit: Payer: Self-pay

## 2020-08-25 DIAGNOSIS — Z1231 Encounter for screening mammogram for malignant neoplasm of breast: Secondary | ICD-10-CM | POA: Diagnosis not present

## 2020-09-24 DIAGNOSIS — E039 Hypothyroidism, unspecified: Secondary | ICD-10-CM | POA: Diagnosis not present

## 2020-09-24 DIAGNOSIS — M1990 Unspecified osteoarthritis, unspecified site: Secondary | ICD-10-CM | POA: Diagnosis not present

## 2020-09-24 DIAGNOSIS — F439 Reaction to severe stress, unspecified: Secondary | ICD-10-CM | POA: Diagnosis not present

## 2020-09-24 DIAGNOSIS — R030 Elevated blood-pressure reading, without diagnosis of hypertension: Secondary | ICD-10-CM | POA: Diagnosis not present

## 2020-09-24 DIAGNOSIS — E1165 Type 2 diabetes mellitus with hyperglycemia: Secondary | ICD-10-CM | POA: Diagnosis not present

## 2020-09-24 DIAGNOSIS — R32 Unspecified urinary incontinence: Secondary | ICD-10-CM | POA: Diagnosis not present

## 2020-09-24 DIAGNOSIS — E669 Obesity, unspecified: Secondary | ICD-10-CM | POA: Diagnosis not present

## 2020-09-24 DIAGNOSIS — I499 Cardiac arrhythmia, unspecified: Secondary | ICD-10-CM | POA: Diagnosis not present

## 2020-09-24 DIAGNOSIS — K219 Gastro-esophageal reflux disease without esophagitis: Secondary | ICD-10-CM | POA: Diagnosis not present

## 2020-10-09 DIAGNOSIS — M79672 Pain in left foot: Secondary | ICD-10-CM | POA: Diagnosis not present

## 2020-10-09 DIAGNOSIS — M7662 Achilles tendinitis, left leg: Secondary | ICD-10-CM | POA: Diagnosis not present

## 2020-11-21 DIAGNOSIS — Z23 Encounter for immunization: Secondary | ICD-10-CM | POA: Diagnosis not present

## 2020-12-23 DIAGNOSIS — E782 Mixed hyperlipidemia: Secondary | ICD-10-CM | POA: Diagnosis not present

## 2020-12-30 ENCOUNTER — Other Ambulatory Visit (HOSPITAL_COMMUNITY): Payer: Self-pay | Admitting: Family Medicine

## 2020-12-30 ENCOUNTER — Other Ambulatory Visit: Payer: Self-pay | Admitting: Family Medicine

## 2020-12-30 DIAGNOSIS — I6529 Occlusion and stenosis of unspecified carotid artery: Secondary | ICD-10-CM

## 2020-12-30 DIAGNOSIS — E669 Obesity, unspecified: Secondary | ICD-10-CM | POA: Diagnosis not present

## 2020-12-30 DIAGNOSIS — M810 Age-related osteoporosis without current pathological fracture: Secondary | ICD-10-CM

## 2020-12-30 DIAGNOSIS — D649 Anemia, unspecified: Secondary | ICD-10-CM | POA: Diagnosis not present

## 2020-12-30 DIAGNOSIS — E039 Hypothyroidism, unspecified: Secondary | ICD-10-CM | POA: Diagnosis not present

## 2020-12-30 DIAGNOSIS — K8020 Calculus of gallbladder without cholecystitis without obstruction: Secondary | ICD-10-CM | POA: Diagnosis not present

## 2020-12-30 DIAGNOSIS — E1122 Type 2 diabetes mellitus with diabetic chronic kidney disease: Secondary | ICD-10-CM | POA: Diagnosis not present

## 2020-12-30 DIAGNOSIS — Z0001 Encounter for general adult medical examination with abnormal findings: Secondary | ICD-10-CM | POA: Diagnosis not present

## 2021-01-13 DIAGNOSIS — E119 Type 2 diabetes mellitus without complications: Secondary | ICD-10-CM | POA: Diagnosis not present

## 2021-02-05 DIAGNOSIS — K219 Gastro-esophageal reflux disease without esophagitis: Secondary | ICD-10-CM | POA: Diagnosis not present

## 2021-02-05 DIAGNOSIS — I499 Cardiac arrhythmia, unspecified: Secondary | ICD-10-CM | POA: Diagnosis not present

## 2021-02-05 DIAGNOSIS — E039 Hypothyroidism, unspecified: Secondary | ICD-10-CM | POA: Diagnosis not present

## 2021-02-05 DIAGNOSIS — Z809 Family history of malignant neoplasm, unspecified: Secondary | ICD-10-CM | POA: Diagnosis not present

## 2021-02-05 DIAGNOSIS — R030 Elevated blood-pressure reading, without diagnosis of hypertension: Secondary | ICD-10-CM | POA: Diagnosis not present

## 2021-02-05 DIAGNOSIS — Z7982 Long term (current) use of aspirin: Secondary | ICD-10-CM | POA: Diagnosis not present

## 2021-02-05 DIAGNOSIS — E119 Type 2 diabetes mellitus without complications: Secondary | ICD-10-CM | POA: Diagnosis not present

## 2021-02-05 DIAGNOSIS — M1990 Unspecified osteoarthritis, unspecified site: Secondary | ICD-10-CM | POA: Diagnosis not present

## 2021-02-05 DIAGNOSIS — Z7984 Long term (current) use of oral hypoglycemic drugs: Secondary | ICD-10-CM | POA: Diagnosis not present

## 2021-02-09 ENCOUNTER — Ambulatory Visit (HOSPITAL_COMMUNITY)
Admission: RE | Admit: 2021-02-09 | Discharge: 2021-02-09 | Disposition: A | Discharge status: Home / Self Care | Payer: Medicare PPO | Source: Ambulatory Visit | Attending: Family Medicine | Admitting: Family Medicine

## 2021-02-09 ENCOUNTER — Other Ambulatory Visit: Payer: Self-pay

## 2021-02-09 DIAGNOSIS — M85852 Other specified disorders of bone density and structure, left thigh: Secondary | ICD-10-CM | POA: Diagnosis not present

## 2021-02-09 DIAGNOSIS — Z780 Asymptomatic menopausal state: Secondary | ICD-10-CM | POA: Diagnosis not present

## 2021-02-09 DIAGNOSIS — I6529 Occlusion and stenosis of unspecified carotid artery: Secondary | ICD-10-CM

## 2021-02-09 DIAGNOSIS — M810 Age-related osteoporosis without current pathological fracture: Secondary | ICD-10-CM | POA: Diagnosis not present

## 2021-02-09 DIAGNOSIS — I6523 Occlusion and stenosis of bilateral carotid arteries: Secondary | ICD-10-CM | POA: Diagnosis not present

## 2021-05-25 DIAGNOSIS — J019 Acute sinusitis, unspecified: Secondary | ICD-10-CM | POA: Diagnosis not present

## 2021-07-07 DIAGNOSIS — E782 Mixed hyperlipidemia: Secondary | ICD-10-CM | POA: Diagnosis not present

## 2021-07-07 DIAGNOSIS — E039 Hypothyroidism, unspecified: Secondary | ICD-10-CM | POA: Diagnosis not present

## 2021-07-07 DIAGNOSIS — E119 Type 2 diabetes mellitus without complications: Secondary | ICD-10-CM | POA: Diagnosis not present

## 2021-07-13 DIAGNOSIS — I6529 Occlusion and stenosis of unspecified carotid artery: Secondary | ICD-10-CM | POA: Diagnosis not present

## 2021-07-13 DIAGNOSIS — E1122 Type 2 diabetes mellitus with diabetic chronic kidney disease: Secondary | ICD-10-CM | POA: Diagnosis not present

## 2021-07-13 DIAGNOSIS — E039 Hypothyroidism, unspecified: Secondary | ICD-10-CM | POA: Diagnosis not present

## 2021-07-13 DIAGNOSIS — M8580 Other specified disorders of bone density and structure, unspecified site: Secondary | ICD-10-CM | POA: Diagnosis not present

## 2021-07-13 DIAGNOSIS — Z6830 Body mass index (BMI) 30.0-30.9, adult: Secondary | ICD-10-CM | POA: Diagnosis not present

## 2021-07-13 DIAGNOSIS — R809 Proteinuria, unspecified: Secondary | ICD-10-CM | POA: Diagnosis not present

## 2021-07-13 DIAGNOSIS — E669 Obesity, unspecified: Secondary | ICD-10-CM | POA: Diagnosis not present

## 2021-07-13 DIAGNOSIS — D649 Anemia, unspecified: Secondary | ICD-10-CM | POA: Diagnosis not present

## 2021-07-13 DIAGNOSIS — Z0001 Encounter for general adult medical examination with abnormal findings: Secondary | ICD-10-CM | POA: Diagnosis not present

## 2021-08-03 ENCOUNTER — Other Ambulatory Visit (HOSPITAL_COMMUNITY): Payer: Self-pay | Admitting: Internal Medicine

## 2021-08-03 DIAGNOSIS — Z1231 Encounter for screening mammogram for malignant neoplasm of breast: Secondary | ICD-10-CM

## 2021-08-27 ENCOUNTER — Ambulatory Visit (HOSPITAL_COMMUNITY): Payer: Medicare PPO

## 2021-09-03 ENCOUNTER — Ambulatory Visit (HOSPITAL_COMMUNITY)
Admission: RE | Admit: 2021-09-03 | Discharge: 2021-09-03 | Disposition: A | Discharge status: Home / Self Care | Payer: Medicare PPO | Source: Ambulatory Visit | Attending: Internal Medicine | Admitting: Internal Medicine

## 2021-09-03 DIAGNOSIS — Z1231 Encounter for screening mammogram for malignant neoplasm of breast: Secondary | ICD-10-CM | POA: Insufficient documentation

## 2021-11-05 DIAGNOSIS — Z23 Encounter for immunization: Secondary | ICD-10-CM | POA: Diagnosis not present

## 2022-01-05 DIAGNOSIS — D509 Iron deficiency anemia, unspecified: Secondary | ICD-10-CM | POA: Diagnosis not present

## 2022-01-05 DIAGNOSIS — E119 Type 2 diabetes mellitus without complications: Secondary | ICD-10-CM | POA: Diagnosis not present

## 2022-01-05 DIAGNOSIS — E039 Hypothyroidism, unspecified: Secondary | ICD-10-CM | POA: Diagnosis not present

## 2022-01-05 DIAGNOSIS — E782 Mixed hyperlipidemia: Secondary | ICD-10-CM | POA: Diagnosis not present

## 2022-01-12 DIAGNOSIS — I6529 Occlusion and stenosis of unspecified carotid artery: Secondary | ICD-10-CM | POA: Diagnosis not present

## 2022-01-12 DIAGNOSIS — E039 Hypothyroidism, unspecified: Secondary | ICD-10-CM | POA: Diagnosis not present

## 2022-01-12 DIAGNOSIS — D649 Anemia, unspecified: Secondary | ICD-10-CM | POA: Diagnosis not present

## 2022-01-12 DIAGNOSIS — E669 Obesity, unspecified: Secondary | ICD-10-CM | POA: Diagnosis not present

## 2022-01-12 DIAGNOSIS — R809 Proteinuria, unspecified: Secondary | ICD-10-CM | POA: Diagnosis not present

## 2022-01-12 DIAGNOSIS — M8580 Other specified disorders of bone density and structure, unspecified site: Secondary | ICD-10-CM | POA: Diagnosis not present

## 2022-01-12 DIAGNOSIS — R790 Abnormal level of blood mineral: Secondary | ICD-10-CM | POA: Diagnosis not present

## 2022-01-12 DIAGNOSIS — E1122 Type 2 diabetes mellitus with diabetic chronic kidney disease: Secondary | ICD-10-CM | POA: Diagnosis not present

## 2022-01-13 ENCOUNTER — Other Ambulatory Visit (HOSPITAL_COMMUNITY): Payer: Self-pay | Admitting: Family Medicine

## 2022-01-13 DIAGNOSIS — I6529 Occlusion and stenosis of unspecified carotid artery: Secondary | ICD-10-CM

## 2022-01-13 DIAGNOSIS — D649 Anemia, unspecified: Secondary | ICD-10-CM | POA: Diagnosis not present

## 2022-01-14 DIAGNOSIS — E119 Type 2 diabetes mellitus without complications: Secondary | ICD-10-CM | POA: Diagnosis not present

## 2022-01-19 ENCOUNTER — Ambulatory Visit (HOSPITAL_COMMUNITY)
Admission: RE | Admit: 2022-01-19 | Discharge: 2022-01-19 | Disposition: A | Discharge status: Home / Self Care | Payer: Medicare PPO | Source: Ambulatory Visit | Attending: Family Medicine | Admitting: Family Medicine

## 2022-01-19 DIAGNOSIS — I6529 Occlusion and stenosis of unspecified carotid artery: Secondary | ICD-10-CM | POA: Insufficient documentation

## 2022-01-19 DIAGNOSIS — I6522 Occlusion and stenosis of left carotid artery: Secondary | ICD-10-CM | POA: Diagnosis not present

## 2022-02-21 NOTE — Progress Notes (Signed)
[place], [place] [address]   CLINIC:  Medical Oncology/Hematology  CONSULT NOTE  Patient Care Team: Celene Squibb, MD as PCP - General (Internal Medicine)  CHIEF COMPLAINTS/PURPOSE OF CONSULTATION:  Macrocytic anemia   HISTORY OF PRESENTING ILLNESS:   Kelly Cook 82 y.o. female is here at the request of Valentino Nose NP for evaluation of macrocytic anemia.  Review of labs in EMR shows mild macrocytic anemia since at least 2021 (normal hemoglobin in 2018) with Hgb 11.3 and MCV 102.4. Lab trend from PCP reveals worsening macrocytic anemia over the past 2 years. (06/20/2020) Hgb 11.0/MCV 104, WBC 5.1, platelets 291, nRBC 1% (12/24/2020) Hgb 10.6/MCV 102, WBC 5.7, platelets 296, nRBC 1% (07/08/2021) Hgb 10.6/MCV 104, WBC 6.1, platelets 298, nRBC 2% (01/05/2022) Hgb 9.9/MCV 105, WBC 5.2, platelets 275, nRBC 1% Additional labs from 01/05/2022 show mildly elevated TSH 4.68 with normal T41.31. Nutritional panel (01/14/2022) showed normal B12 696 and normal folic acid 6.7; iron saturation 61% with elevated serum iron 157.  Kidney function appears normal with creatinine 0.88 and GFR 67.  Patient reports occasional scant hemorrhoid bleeding every month or two, but denies any evidence of major bleeding events such as hematemesis, hematochezia, or melena.  She reports some intermittent lightheadedness that started 3 to 4 weeks ago.  She denies any pica, abnormal headaches, syncope, chest pain, dyspnea, palpitations.  She reports gradual fatigue over the past several years.  She denies any unexplained fever, chills, night sweats, or weight loss.  She takes aspirin 81 mg at home, denies any other blood thinners.  She denies any previous history of anemia or blood transfusion.  She had colonoscopy (11/27/2015) which showed normal colon and external hemorrhoids.  Past medical history significant for hypothyroidism, diabetes, hyperlipidemia, obesity, carotid  artery stenosis, osteoporosis  She lives at home with her husband and is fully independent.  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  She ambulates without assistive device, keeps up with her ADLs, and stays active by taking daily walks.  She has a daughter who is mentally handicapped and lives in a group home.  She is a former smoker (smoked about 1 PPD x 30 years), quit in 1983.  She denies any alcohol or illicit drug use.  She reports that her father had lymphoma.  No other family history of malignancy or hematologic disorders.  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   Coccyx feels prominent and deviated to left, will try NSAID, if not better xray   Constipation 06/16/2012   Diabetes mellitus    Gall bladder stones    Hemorrhoids    History of kidney stones    Hypothyroid 06/16/2012   Hypothyroid 06/16/2012   Osteopenia    Osteoporosis    Otitis externa 12/01/2012   Shingles    Urinary incontinence 07/02/2015    SURGICAL HISTORY: Past Surgical History:  Procedure Laterality Date   ABDOMINAL HYSTERECTOMY     dysfunctional uterine bleeding   APPENDECTOMY     CATARACT EXTRACTION W/PHACO Left 11/14/2017   Procedure: CATARACT EXTRACTION PHACO AND INTRAOCULAR LENS PLACEMENT ([place]);  Surgeon: Tonny Branch, MD;  Location: [facility];  Service: Ophthalmology;  Laterality: Left;  CDE: 9.02   CATARACT EXTRACTION W/PHACO Right 11/28/2017   Procedure: CATARACT EXTRACTION PHACO AND INTRAOCULAR LENS PLACEMENT RIGHT EYE CDE=10.42;  Surgeon: Tonny Branch, MD;  Location: [facility];  Service: Ophthalmology;  Laterality:  Right;  right   CHOLECYSTECTOMY N/A 06/20/2019   Procedure: LAPAROSCOPIC CHOLECYSTECTOMY;  Surgeon: Virl Cagey, MD;  Location: [facility];  Service: General;  Laterality: N/A;   COLONOSCOPY N/A 11/27/2015   Procedure: COLONOSCOPY;  Surgeon: Rogene Houston, MD;  Location: [facility];  Service: Endoscopy;  Laterality: N/A;   730   EXTRACORPOREAL SHOCK WAVE LITHOTRIPSY Left 11/01/2016   Procedure: LEFT EXTRACORPOREAL SHOCK WAVE LITHOTRIPSY ([facility]);  Surgeon: Festus Aloe, MD;  Location: [facility];  Service: Urology;  Laterality: Left;   salivary tumor     TONSILLECTOMY     adenoids   TUBAL LIGATION      SOCIAL HISTORY: Social History   Socioeconomic History   Marital status: Married    Spouse name: Not on file   Number of children: Not on file   Years of education: Not on file   Highest education level: Not on file  Occupational History   Not on file  Tobacco Use   Smoking status: Former    Years: 24.00    Types: Cigarettes   Smokeless tobacco: Never  Vaping Use   Vaping Use: Never used  Substance and Sexual Activity   Alcohol use: No   Drug use: No   Sexual activity: Not Currently    Birth control/protection: Surgical    Comment: hyst  Other Topics Concern   Not on file  Social History Narrative   Not on file   Social Determinants of Health   Financial Resource Strain: Not on file  Food Insecurity: Not on file  Transportation Needs: Not on file  Physical Activity: Not on file  Stress: Not on file  Social Connections: Not on file  Intimate Partner Violence: Not on file    FAMILY HISTORY: Family History  Problem Relation Age of Onset   Cancer Father    Dementia Mother    Diabetes Brother    Heart disease Brother    Mental retardation Daughter        in group home    ALLERGIES:  has no allergies on file.  MEDICATIONS:  Current Outpatient Medications  Medication Sig Dispense Refill   1st Choice Lancets Ultra Thin MISC      ACCU-CHEK AVIVA PLUS test strip USE TO TEST BLOOD SUGAR EVERY DAY     acetaminophen (TYLENOL) 500 MG tablet Take 1,000 mg by mouth every 6 (six) hours as needed for moderate pain.      ascorbic acid (VITAMIN C) 500 MG tablet Take 500 mg by mouth at bedtime.     Cholecalciferol (DIALYVITE VITAMIN D 5000) 125 MCG (5000 UT) capsule Take 5,000 Units by  mouth at bedtime.     levothyroxine (SYNTHROID) 88 MCG tablet Take 88 mcg by mouth daily before breakfast.      metFORMIN (GLUCOPHAGE-XR) 500 MG 24 hr tablet Take 500 mg by mouth every evening.     No current facility-administered medications for this visit.    REVIEW OF SYSTEMS:    Review of Systems  Constitutional:  Positive for fatigue. Negative for appetite change, chills, diaphoresis, fever and unexpected weight change.  HENT:   Negative for lump/mass and nosebleeds.   Eyes:  Negative for eye problems.  Respiratory:  Negative for cough, hemoptysis and shortness of breath.   Cardiovascular:  Negative for chest pain, leg swelling and palpitations.  Gastrointestinal:  Positive for constipation and diarrhea. Negative for abdominal pain, blood in stool, nausea and vomiting.  Genitourinary:  Negative for hematuria.   Skin:  Negative.   Neurological:  Positive for light-headedness. Negative for dizziness and headaches.  Hematological:  Does not bruise/bleed easily.      PHYSICAL EXAMINATION:   ECOG PERFORMANCE STATUS: 1 - Symptomatic but completely ambulatory  There were no vitals filed for this visit. There were no vitals filed for this visit.  Physical Exam Constitutional:      Appearance: Normal appearance. She is obese.  HENT:     Head: Normocephalic and atraumatic.     Mouth/Throat:     Mouth: Mucous membranes are moist.  Eyes:     Extraocular Movements: Extraocular movements intact.     Pupils: Pupils are equal, round, and reactive to light.  Cardiovascular:     Rate and Rhythm: Normal rate and regular rhythm.     Pulses: Normal pulses.     Heart sounds: Normal heart sounds.  Pulmonary:     Effort: Pulmonary effort is normal.     Breath sounds: Normal breath sounds.  Abdominal:     General: Bowel sounds are normal.     Palpations: Abdomen is soft.     Tenderness: There is no abdominal tenderness.  Musculoskeletal:        General: No swelling.     Right lower leg:  No edema.     Left lower leg: No edema.  Lymphadenopathy:     Cervical: No cervical adenopathy.  Skin:    General: Skin is warm and dry.  Neurological:     General: No focal deficit present.     Mental Status: She is alert and oriented to person, place, and time.  Psychiatric:        Mood and Affect: Mood normal.        Behavior: Behavior normal.       LABORATORY DATA:  I have reviewed the data as listed No results found for this or any previous visit (from the past 2160 hour(s)).  RADIOGRAPHIC STUDIES: I have personally reviewed the radiological images as listed and agreed with the findings in the report. No results found.   ASSESSMENT & PLAN:  1.  Macrocytic anemia - Seen at the request of Valentino Nose NP for evaluation of macrocytic anemia that has been progressive over the past 3 years - Most recent CBC (01/05/2022) Hgb 9.9/MCV 105, WBC 5.2, platelets 275, nRBC 1% - Additional labs from 01/05/2022 show mildly elevated TSH 4.68 with normal T4 at 1.31.Kidney function appears normal with creatinine 0.88 and GFR 67. - Nutritional panel (01/14/2022) showed normal B12 161 and normal folic acid 6.7; iron saturation 61% with elevated serum iron 157.   - No rectal bleeding or melena. - No B symptoms.  No palpable lymphadenopathy or splenomegaly on exam. - DIFFERENTIAL DIAGNOSIS of macrocytic anemia includes nutritional deficiencies, hemolysis, and bone marrow infiltrative process (such as MDS or myeloma) - PLAN: We will check labs TODAY (CBC/D, CMP, copper, MMA, ferritin, homocystine, haptoglobin, erythropoietin, reticulocytes, LDH, SPEP, light chains, immunofixation) - OFFICE visit in 2 to 3 weeks to discuss results.  If above workup is insufficient to explain macrocytic anemia, we will proceed with bone marrow biopsy.    2.  Other history - PMH: Hypothyroidism, diabetes, hyperlipidemia, obesity, carotid artery stenosis, osteoporosis  - SOCIAL: She lives at home with her husband and  is fully independent. 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 She ambulates without assistive device, keeps up with her ADLs, and stays active by taking daily walks. She has a daughter who  is mentally handicapped and lives in a group home. She is a former smoker (smoked about 1 PPD x 30 years), quit in 1983. She denies any alcohol or illicit drug use.  - FAMILY: Father had lymphoma  PLAN SUMMARY: >> Labs today (CBC/D, CMP, copper, MMA, ferritin, homocystine, haptoglobin, erythropoietin, reticulocytes, LDH, SPEP, light chains, immunofixation) >> OFFICE visit in 2 to 3 weeks    All questions were answered. The patient knows to call the clinic with any problems, questions or concerns.  Medical decision making: Moderate  Time spent on visit: I spent 25 minutes counseling the patient face to face. The total time spent in the appointment was 45 minutes and more than 50% was on counseling.  I, Tarri Abernethy PA-C, have seen this patient in conjunction with Dr. Derek Jack.  Greater than 50% of visit was performed by Dr. Delton Coombes.   Harriett Rush, PA-C 02/22/2022 2:14 PM  DR. Beverley Allender: I have independently evaluated this patient and formulated my assessment and plan.  I agree with HPI, assessment and plan written by Casey Burkitt, PA-C.  Patient evaluated for macrocytic anemia.  Last colonoscopy in 2017.  Has history of external hemorrhoids and occasionally sees blood on the tissue.  Will check for nutritional deficiencies, hemolysis, bone marrow infiltrative process.  If the above tests are not revealing, will consider bone marrow biopsy to evaluate for early MDS.

## 2022-02-22 ENCOUNTER — Inpatient Hospital Stay: Payer: Medicare PPO

## 2022-02-22 ENCOUNTER — Inpatient Hospital Stay: Payer: Medicare PPO | Attending: Hematology | Admitting: Hematology

## 2022-02-22 ENCOUNTER — Encounter: Payer: Self-pay | Admitting: Hematology

## 2022-02-22 VITALS — BP 153/72 | HR 73 | Temp 98.0°F | Resp 18 | Ht 61.02 in | Wt 157.6 lb

## 2022-02-22 DIAGNOSIS — Z79899 Other long term (current) drug therapy: Secondary | ICD-10-CM | POA: Diagnosis not present

## 2022-02-22 DIAGNOSIS — D539 Nutritional anemia, unspecified: Secondary | ICD-10-CM

## 2022-02-22 DIAGNOSIS — Z90710 Acquired absence of both cervix and uterus: Secondary | ICD-10-CM | POA: Diagnosis not present

## 2022-02-22 DIAGNOSIS — E039 Hypothyroidism, unspecified: Secondary | ICD-10-CM | POA: Diagnosis not present

## 2022-02-22 DIAGNOSIS — Z7984 Long term (current) use of oral hypoglycemic drugs: Secondary | ICD-10-CM | POA: Insufficient documentation

## 2022-02-22 DIAGNOSIS — Z7982 Long term (current) use of aspirin: Secondary | ICD-10-CM | POA: Insufficient documentation

## 2022-02-22 DIAGNOSIS — Z87891 Personal history of nicotine dependence: Secondary | ICD-10-CM | POA: Insufficient documentation

## 2022-02-22 DIAGNOSIS — Z79890 Hormone replacement therapy (postmenopausal): Secondary | ICD-10-CM | POA: Insufficient documentation

## 2022-02-22 DIAGNOSIS — E119 Type 2 diabetes mellitus without complications: Secondary | ICD-10-CM | POA: Insufficient documentation

## 2022-02-22 DIAGNOSIS — E785 Hyperlipidemia, unspecified: Secondary | ICD-10-CM | POA: Diagnosis not present

## 2022-02-22 LAB — CBC WITH DIFFERENTIAL/PLATELET
Abs Immature Granulocytes: 0.05 10*3/uL (ref 0.00–0.07)
Basophils Absolute: 0.1 10*3/uL (ref 0.0–0.1)
Basophils Relative: 1 %
Eosinophils Absolute: 0.2 10*3/uL (ref 0.0–0.5)
Eosinophils Relative: 3 %
HCT: 32.8 % — ABNORMAL LOW (ref 36.0–46.0)
Hemoglobin: 10.7 g/dL — ABNORMAL LOW (ref 12.0–15.0)
Immature Granulocytes: 1 %
Lymphocytes Relative: 35 %
Lymphs Abs: 2.6 10*3/uL (ref 0.7–4.0)
MCH: 34.9 pg — ABNORMAL HIGH (ref 26.0–34.0)
MCHC: 32.6 g/dL (ref 30.0–36.0)
MCV: 106.8 fL — ABNORMAL HIGH (ref 80.0–100.0)
Monocytes Absolute: 0.6 10*3/uL (ref 0.1–1.0)
Monocytes Relative: 8 %
Neutro Abs: 3.9 10*3/uL (ref 1.7–7.7)
Neutrophils Relative %: 52 %
Platelets: 311 10*3/uL (ref 150–400)
RBC: 3.07 MIL/uL — ABNORMAL LOW (ref 3.87–5.11)
RDW: 15.8 % — ABNORMAL HIGH (ref 11.5–15.5)
WBC: 7.3 10*3/uL (ref 4.0–10.5)
nRBC: 0.7 % — ABNORMAL HIGH (ref 0.0–0.2)

## 2022-02-22 LAB — COMPREHENSIVE METABOLIC PANEL
ALT: 21 U/L (ref 0–44)
AST: 18 U/L (ref 15–41)
Albumin: 4.2 g/dL (ref 3.5–5.0)
Alkaline Phosphatase: 38 U/L (ref 38–126)
Anion gap: 9 (ref 5–15)
BUN: 18 mg/dL (ref 8–23)
CO2: 24 mmol/L (ref 22–32)
Calcium: 9.2 mg/dL (ref 8.9–10.3)
Chloride: 105 mmol/L (ref 98–111)
Creatinine, Ser: 0.85 mg/dL (ref 0.44–1.00)
GFR, Estimated: >60 mL/min (ref >60)
Glucose, Bld: 122 mg/dL — ABNORMAL HIGH (ref 70–99)
Potassium: 3.6 mmol/L (ref 3.5–5.1)
Sodium: 138 mmol/L (ref 135–145)
Total Bilirubin: 0.9 mg/dL (ref 0.3–1.2)
Total Protein: 7.9 g/dL (ref 6.5–8.1)

## 2022-02-22 LAB — FERRITIN: Ferritin: 298 ng/mL (ref 11–307)

## 2022-02-22 LAB — LACTATE DEHYDROGENASE: LDH: 117 U/L (ref 98–192)

## 2022-02-22 LAB — RETICULOCYTES
Immature Retic Fract: 28.8 % — ABNORMAL HIGH (ref 2.3–15.9)
RBC.: 3.07 MIL/uL — ABNORMAL LOW (ref 3.87–5.11)
Retic Count, Absolute: 80.7 10*3/uL (ref 19.0–186.0)
Retic Ct Pct: 2.6 % (ref 0.4–3.1)

## 2022-02-22 NOTE — Patient Instructions (Signed)
[facility] **   You were seen today by Dr. Delton Coombes and Tarri Abernethy PA-C for your anemia.   We do not yet know the cause of your anemia.  Will check labs today to look for common causes of low red blood cells. If the labs we checked today did not show any obvious reason for your anemia, we may recommend bone marrow biopsy after your next visit.  LABS: Check labs TODAY before leaving the hospital building.  FOLLOW-UP APPOINTMENT: Office visit in 2 to 3 weeks to discuss results  ** Thank you for trusting me with your healthcare!  I strive to provide all of my patients with quality care at each visit.  If you receive a survey for this visit, I would be so grateful to you for taking the time to provide feedback.  Thank you in advance!  ~ Kazoua Gossen                   Dr. Derek Jack   &   Tarri Abernethy, PA-C   - - - - - - - - - - - - - - - - - -    Thank you for choosing [place] at [facility] to provide your oncology and hematology care.  To afford each patient quality time with our provider, please arrive at least 15 minutes before your scheduled appointment time.   If you have a lab appointment with the [facility] please come in thru the Main Entrance and check in at the main information desk.  You need to re-schedule your appointment should you arrive 10 or more minutes late.  We strive to give you quality time with our providers, and arriving late affects you and other patients whose appointments are after yours.  Also, if you no show three or more times for appointments you may be dismissed from the clinic at the providers discretion.     Again, thank you for choosing [facility].  Our hope is that these requests will decrease the amount of time that you wait before being seen by our physicians.        _____________________________________________________________  Should you have questions after your visit to [facility], please contact our office at [phone] and follow the prompts.  Our office hours are 8:00 a.m. and 4:30 p.m. Monday - Friday.  Please note that voicemails left after 4:00 p.m. may not be returned until the following business day.  We are closed weekends and major holidays.  You do have access to a nurse 24-7, just call the main number to the clinic [phone] and do not press any options, hold on the line and a nurse will answer the phone.    For prescription refill requests, have your pharmacy contact our office and allow 72 hours.

## 2022-02-23 LAB — HOMOCYSTEINE: Homocysteine: 12.4 umol/L (ref 0.0–19.2)

## 2022-02-23 LAB — HAPTOGLOBIN: Haptoglobin: 48 mg/dL (ref 42–346)

## 2022-02-23 LAB — KAPPA/LAMBDA LIGHT CHAINS
Kappa free light chain: 45 mg/L — ABNORMAL HIGH (ref 3.3–19.4)
Kappa, lambda light chain ratio: 1.78 — ABNORMAL HIGH (ref 0.26–1.65)
Lambda free light chains: 25.3 mg/L (ref 5.7–26.3)

## 2022-02-23 LAB — ERYTHROPOIETIN: Erythropoietin: 57.7 m[IU]/mL — ABNORMAL HIGH (ref 2.6–18.5)

## 2022-02-24 LAB — COPPER, SERUM: Copper: 83 ug/dL (ref 80–158)

## 2022-02-25 LAB — PROTEIN ELECTROPHORESIS, SERUM
A/G Ratio: 1.3 (ref 0.7–1.7)
Albumin ELP: 4 g/dL (ref 2.9–4.4)
Alpha-1-Globulin: 0.2 g/dL (ref 0.0–0.4)
Alpha-2-Globulin: 0.5 g/dL (ref 0.4–1.0)
Beta Globulin: 1.1 g/dL (ref 0.7–1.3)
Gamma Globulin: 1.4 g/dL (ref 0.4–1.8)
Globulin, Total: 3.2 g/dL (ref 2.2–3.9)
Total Protein ELP: 7.2 g/dL (ref 6.0–8.5)

## 2022-02-25 LAB — METHYLMALONIC ACID, SERUM: Methylmalonic Acid, Quantitative: 273 nmol/L (ref 0–378)

## 2022-02-26 LAB — IMMUNOFIXATION ELECTROPHORESIS
IgA: 431 mg/dL — ABNORMAL HIGH (ref 64–422)
IgG (Immunoglobin G), Serum: 1445 mg/dL (ref 586–1602)
IgM (Immunoglobulin M), Srm: 138 mg/dL (ref 26–217)
Total Protein ELP: 7.3 g/dL (ref 6.0–8.5)

## 2022-03-15 NOTE — Progress Notes (Unsigned)
[place], [place] [address]   CLINIC:  Medical Oncology/Hematology  PCP:  Celene Squibb, MD [place] [address] [phone]   REASON FOR VISIT:  Follow-up for macrocytic anemia  PRIOR THERAPY: None  CURRENT THERAPY: Under workup  INTERVAL HISTORY:   Ms. Sirmon 82 y.o. female returns for routine follow-up of macrocytic anemia.  She was seen for initial consultation by Dr. Delton Coombes and Tarri Abernethy PA-C on 02/22/2022.  At today's visit, she reports feeling fairly well.  She denies any changes in her symptoms or baseline health status since her initial visit 3 weeks ago.  She continues to report occasional scant hemorrhoid bleeding every month or two, but denies any evidence of major bleeding events such as hematemesis, hematochezia, or melena.  She reports some intermittent lightheadedness that started about 2 months ago.  She denies any pica, abnormal headaches, syncope, chest pain, dyspnea, palpitations.  She reports gradual fatigue over the past several years.  She denies any unexplained fever, chills, night sweats, or weight loss.  She does report some soreness of her left anterior neck that has been present for the past month.  She has 80% energy and 85% appetite. She endorses that she is maintaining a stable weight.  ASSESSMENT & PLAN:  1.   Macrocytic anemia - Seen at the request of Valentino Nose NP for evaluation of macrocytic anemia that has been progressive over the past 3 years - Most recent CBC (01/05/2022) Hgb 9.9/MCV 105, WBC 5.2, platelets 275, nRBC 1% - Additional labs from 01/05/2022 show mildly elevated TSH 4.68 with normal T4 at 1.31.Kidney function appears normal with creatinine 0.88 and GFR 67. - Nutritional panel (01/14/2022) showed normal B12 0000000 and normal folic acid 6.7; iron saturation 61% with elevated serum iron 157. - Abdominal ultrasound (2021) shows increased liver echogenicity which is  commonly seen in fatty infiltration of the liver - Hematology workup (02/22/2022): Immunofixation shows polyclonal gammopathy.  SPEP negative.  Elevated kappa free light chains 45.0, normal lambda light chains 25.3, mildly elevated ratio 1.78. Normal MMA, copper, ferritin, homocystine Normal haptoglobin and LDH.  Reticulocytes 2.6%, which is hypoproliferative for the degree of her anemia. Erythropoietin elevated to 57.7. - Most recent CBC (02/22/2022): Hgb 10.7/MCV 106.8, nRBC 0.7% - No rectal bleeding or melena. - No B symptoms.  No splenomegaly.  She has some mild prominence of left anterior cervical lymph node.  Will pay close attention to this at follow-up visit in 1 month and consider imaging if persistent or progressive. - DIFFERENTIAL DIAGNOSIS of macrocytic anemia includes nutritional deficiencies, hemolysis, and bone marrow infiltrative process (such as MDS or myeloma) - PLAN: We will check bone marrow biopsy to evaluate for any evidence of early MDS. - We will check ultrasound liver and spleen. - Office visit 1 week after BMBx to discuss results and next steps.   2.  Other history - PMH: Hypothyroidism, diabetes, hyperlipidemia, obesity, carotid artery stenosis, osteoporosis  - SOCIAL: She lives at home with her husband and is fully independent. 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 She ambulates without assistive device, keeps up with her ADLs, and stays active by taking daily walks. She has a daughter who is mentally handicapped and lives in a group home. She is a former smoker (smoked about 1 PPD x 30 years), quit in 1983. She denies any alcohol or illicit drug use.  - FAMILY: Father had lymphoma  PLAN SUMMARY: >> Bone marrow biopsy (CT-guided/IR) >> Abdominal ultrasound >> Same-day labs (CBC/D) + OFFICE visit in 1 month (1 week after biopsy/US)      REVIEW OF SYSTEMS:   Review of Systems  Constitutional:  Negative for appetite change, chills,  diaphoresis, fatigue, fever and unexpected weight change.  HENT:   Positive for tinnitus (left ear). Negative for lump/mass and nosebleeds.   Eyes:  Negative for eye problems.  Respiratory:  Negative for cough, hemoptysis and shortness of breath.   Cardiovascular:  Negative for chest pain, leg swelling and palpitations.  Gastrointestinal:  Negative for abdominal pain, blood in stool, constipation, diarrhea, nausea and vomiting.  Genitourinary:  Negative for hematuria.   Musculoskeletal:  Positive for neck pain.  Skin: Negative.   Neurological:  Positive for dizziness. Negative for headaches and light-headedness.  Hematological:  Does not bruise/bleed easily.     PHYSICAL EXAM:  ECOG PERFORMANCE STATUS: 1 - Symptomatic but completely ambulatory  There were no vitals filed for this visit. There were no vitals filed for this visit. Physical Exam Constitutional:      Appearance: Normal appearance. She is obese.  HENT:     Head: Normocephalic and atraumatic.     Mouth/Throat:     Mouth: Mucous membranes are moist.  Eyes:     Extraocular Movements: Extraocular movements intact.     Pupils: Pupils are equal, round, and reactive to light.  Cardiovascular:     Rate and Rhythm: Normal rate and regular rhythm.     Pulses: Normal pulses.     Heart sounds: Normal heart sounds.  Pulmonary:     Effort: Pulmonary effort is normal.     Breath sounds: Normal breath sounds.  Abdominal:     General: Bowel sounds are normal.     Palpations: Abdomen is soft.     Tenderness: There is no abdominal tenderness.  Musculoskeletal:        General: No swelling.     Right lower leg: No edema.     Left lower leg: No edema.  Lymphadenopathy:     Cervical: Cervical adenopathy present.     Right cervical: Superficial cervical adenopathy present.     Comments: Mild prominence of left mid-jugular cervical lymph node.  Skin:    General: Skin is warm and dry.  Neurological:     General: No focal deficit  present.     Mental Status: She is alert and oriented to person, place, and time.  Psychiatric:        Mood and Affect: Mood normal.        Behavior: Behavior normal.     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   Coccyx feels prominent and deviated to left, will try NSAID, if not better xray   Constipation 06/16/2012   Diabetes mellitus    Gall bladder stones    Hemorrhoids    History of kidney stones    Hypothyroid 06/16/2012   Hypothyroid 06/16/2012   Osteopenia    Osteoporosis    Otitis externa 12/01/2012   Shingles    Urinary incontinence 07/02/2015   Past Surgical History:  Procedure Laterality Date   ABDOMINAL HYSTERECTOMY     dysfunctional uterine bleeding   APPENDECTOMY     CATARACT EXTRACTION W/PHACO Left 11/14/2017   Procedure: CATARACT EXTRACTION PHACO AND INTRAOCULAR LENS PLACEMENT ([place]);  Surgeon: Tonny Branch, MD;  Location: [facility];  Service: Ophthalmology;  Laterality: Left;  CDE: 9.02  CATARACT EXTRACTION W/PHACO Right 11/28/2017   Procedure: CATARACT EXTRACTION PHACO AND INTRAOCULAR LENS PLACEMENT RIGHT EYE CDE=10.42;  Surgeon: Tonny Branch, MD;  Location: [facility];  Service: Ophthalmology;  Laterality: Right;  right   CHOLECYSTECTOMY N/A 06/20/2019   Procedure: LAPAROSCOPIC CHOLECYSTECTOMY;  Surgeon: Virl Cagey, MD;  Location: [facility];  Service: General;  Laterality: N/A;   COLONOSCOPY N/A 11/27/2015   Procedure: COLONOSCOPY;  Surgeon: Rogene Houston, MD;  Location: [facility];  Service: Endoscopy;  Laterality: N/A;  730   EXTRACORPOREAL SHOCK WAVE LITHOTRIPSY Left 11/01/2016   Procedure: LEFT EXTRACORPOREAL SHOCK WAVE LITHOTRIPSY (ESWL);  Surgeon: Festus Aloe, MD;  Location: [facility];  Service: Urology;  Laterality: Left;   salivary tumor     TONSILLECTOMY     adenoids   TUBAL LIGATION      SOCIAL HISTORY:  Social History   Socioeconomic History   Marital status: Married    Spouse  name: Not on file   Number of children: Not on file   Years of education: Not on file   Highest education level: Not on file  Occupational History   Not on file  Tobacco Use   Smoking status: Former    Years: 24.00    Types: Cigarettes   Smokeless tobacco: Never  Vaping Use   Vaping Use: Never used  Substance and Sexual Activity   Alcohol use: No   Drug use: No   Sexual activity: Not Currently    Birth control/protection: Surgical    Comment: hyst  Other Topics Concern   Not on file  Social History Narrative   Not on file   Social Determinants of Health   Financial Resource Strain: Not on file  Food Insecurity: No Food Insecurity (02/22/2022)   Hunger Vital Sign    Worried About Running Out of Food in the Last Year: Never true    Ran Out of Food in the Last Year: Never true  Transportation Needs: No Transportation Needs (02/22/2022)   PRAPARE - Hydrologist (Medical): No    Lack of Transportation (Non-Medical): No  Physical Activity: Not on file  Stress: Not on file  Social Connections: Not on file  Intimate Partner Violence: Not At Risk (02/22/2022)   Humiliation, Afraid, Rape, and Kick questionnaire    Fear of Current or Ex-Partner: No    Emotionally Abused: No    Physically Abused: No    Sexually Abused: No    FAMILY HISTORY:  Family History  Problem Relation Age of Onset   Cancer Father    Dementia Mother    Diabetes Brother    Heart disease Brother    Mental retardation Daughter        in group home    CURRENT MEDICATIONS:  Outpatient Encounter Medications as of 03/16/2022  Medication Sig   1st Choice Lancets Ultra Thin MISC    ACCU-CHEK AVIVA PLUS test strip USE TO TEST BLOOD SUGAR EVERY DAY   acetaminophen (TYLENOL) 500 MG tablet Take 1,000 mg by mouth every 6 (six) hours as needed for moderate pain.    ascorbic acid (VITAMIN C) 500 MG tablet Take 500 mg by mouth at bedtime.   Cholecalciferol (DIALYVITE VITAMIN D 5000) 125  MCG (5000 UT) capsule Take 5,000 Units by mouth at bedtime.   levothyroxine (SYNTHROID) 88 MCG tablet Take 88 mcg by mouth daily before breakfast.    metFORMIN (GLUCOPHAGE-XR) 500 MG 24 hr tablet Take 500 mg by mouth every  evening.   No facility-administered encounter medications on file as of 03/16/2022.    ALLERGIES:  Not on File  LABORATORY DATA:  I have reviewed the labs as listed.  CBC    Component Value Date/Time   WBC 7.3 02/22/2022 1400   RBC 3.07 (L) 02/22/2022 1401   RBC 3.07 (L) 02/22/2022 1400   HGB 10.7 (L) 02/22/2022 1400   HCT 32.8 (L) 02/22/2022 1400   PLT 311 02/22/2022 1400   MCV 106.8 (H) 02/22/2022 1400   MCH 34.9 (H) 02/22/2022 1400   MCHC 32.6 02/22/2022 1400   RDW 15.8 (H) 02/22/2022 1400   LYMPHSABS 2.6 02/22/2022 1400   MONOABS 0.6 02/22/2022 1400   EOSABS 0.2 02/22/2022 1400   BASOSABS 0.1 02/22/2022 1400      Latest Ref Rng & Units 02/22/2022    2:00 PM 06/18/2019   11:30 AM 05/18/2019   12:48 PM  CMP  Glucose 70 - 99 mg/dL 122  127  119   BUN 8 - 23 mg/dL 18  19  17   $ Creatinine 0.44 - 1.00 mg/dL 0.85  0.87  0.82   Sodium 135 - 145 mmol/L 138  138  140   Potassium 3.5 - 5.1 mmol/L 3.6  3.8  4.4   Chloride 98 - 111 mmol/L 105  108  108   CO2 22 - 32 mmol/L 24  21  22   $ Calcium 8.9 - 10.3 mg/dL 9.2  8.7  9.1   Total Protein 6.5 - 8.1 g/dL 7.9     Total Bilirubin 0.3 - 1.2 mg/dL 0.9     Alkaline Phos 38 - 126 U/L 38     AST 15 - 41 U/L 18     ALT 0 - 44 U/L 21       DIAGNOSTIC IMAGING:  I have independently reviewed the relevant imaging and discussed with the patient.   WRAP UP:  All questions were answered. The patient knows to call the clinic with any problems, questions or concerns.  Medical decision making: Moderate  Time spent on visit: I spent 20 minutes counseling the patient face to face. The total time spent in the appointment was 30 minutes and more than 50% was on counseling.  Harriett Rush, PA-C  03/16/22 8:42 AM

## 2022-03-16 ENCOUNTER — Other Ambulatory Visit: Payer: Self-pay

## 2022-03-16 ENCOUNTER — Inpatient Hospital Stay: Payer: Medicare PPO | Attending: Hematology | Admitting: Physician Assistant

## 2022-03-16 VITALS — BP 143/61 | HR 83 | Temp 98.3°F | Resp 17 | Ht 61.0 in | Wt 157.7 lb

## 2022-03-16 DIAGNOSIS — D539 Nutritional anemia, unspecified: Secondary | ICD-10-CM | POA: Diagnosis not present

## 2022-03-16 DIAGNOSIS — Z87891 Personal history of nicotine dependence: Secondary | ICD-10-CM | POA: Diagnosis not present

## 2022-03-16 DIAGNOSIS — M810 Age-related osteoporosis without current pathological fracture: Secondary | ICD-10-CM | POA: Diagnosis not present

## 2022-03-16 DIAGNOSIS — D890 Polyclonal hypergammaglobulinemia: Secondary | ICD-10-CM | POA: Diagnosis not present

## 2022-03-16 DIAGNOSIS — E119 Type 2 diabetes mellitus without complications: Secondary | ICD-10-CM | POA: Diagnosis not present

## 2022-03-16 DIAGNOSIS — E039 Hypothyroidism, unspecified: Secondary | ICD-10-CM | POA: Diagnosis not present

## 2022-03-16 NOTE — Patient Instructions (Signed)
[facility] **   You were seen today by Tarri Abernethy PA-C for your anemia.   The labs that we checked did not show any obvious cause of your anemia. We will check a BONE MARROW BIOPSY (at [facility]) to see if you have any underlying bone marrow disorder (such as myelodysplastic syndrome). We will also check ABDOMINAL ULTRASOUND to look at your liver and spleen.  FOLLOW-UP APPOINTMENT: Same-day labs and office visit in approximately 1 month (1 week after bone marrow biopsy)  ** Thank you for trusting me with your healthcare!  I strive to provide all of my patients with quality care at each visit.  If you receive a survey for this visit, I would be so grateful to you for taking the time to provide feedback.  Thank you in advance!  ~ Daune Divirgilio                   Dr. Derek Jack   &   Tarri Abernethy, PA-C   - - - - - - - - - - - - - - - - - -    Thank you for choosing [facility] to provide your oncology and hematology care.  To afford each patient quality time with our provider, please arrive at least 15 minutes before your scheduled appointment time.   If you have a lab appointment with the [facility] please come in thru the Main Entrance and check in at the main information desk.  You need to re-schedule your appointment should you arrive 10 or more minutes late.  We strive to give you quality time with our providers, and arriving late affects you and other patients whose appointments are after yours.  Also, if you no show three or more times for appointments you may be dismissed from the clinic at the providers discretion.     Again, thank you for choosing [facility].  Our hope is that these requests will decrease the amount of time that you wait before being seen by our physicians.        _____________________________________________________________  Should you have questions after your visit to [facility], please contact our office at [phone] and follow the prompts.  Our office hours are 8:00 a.m. and 4:30 p.m. Monday - Friday.  Please note that voicemails left after 4:00 p.m. may not be returned until the following business day.  We are closed weekends and major holidays.  You do have access to a nurse 24-7, just call the main number to the clinic [phone] and do not press any options, hold on the line and a nurse will answer the phone.    For prescription refill requests, have your pharmacy contact our office and allow 72 hours.

## 2022-03-16 NOTE — Addendum Note (Signed)
Addended by: Tarri Abernethy on: 03/16/2022 08:49 AM   Modules accepted: Orders

## 2022-03-26 ENCOUNTER — Ambulatory Visit (HOSPITAL_COMMUNITY)
Admission: RE | Admit: 2022-03-26 | Discharge: 2022-03-26 | Disposition: A | Discharge status: Home / Self Care | Payer: Medicare PPO | Source: Ambulatory Visit | Attending: Physician Assistant | Admitting: Physician Assistant

## 2022-03-26 DIAGNOSIS — D539 Nutritional anemia, unspecified: Secondary | ICD-10-CM | POA: Insufficient documentation

## 2022-03-26 DIAGNOSIS — R161 Splenomegaly, not elsewhere classified: Secondary | ICD-10-CM | POA: Diagnosis not present

## 2022-03-26 DIAGNOSIS — K760 Fatty (change of) liver, not elsewhere classified: Secondary | ICD-10-CM | POA: Diagnosis not present

## 2022-03-26 DIAGNOSIS — Z9049 Acquired absence of other specified parts of digestive tract: Secondary | ICD-10-CM | POA: Diagnosis not present

## 2022-04-05 ENCOUNTER — Other Ambulatory Visit: Payer: Self-pay | Admitting: *Deleted

## 2022-04-14 ENCOUNTER — Ambulatory Visit (HOSPITAL_COMMUNITY): Payer: Medicare PPO

## 2022-04-14 ENCOUNTER — Other Ambulatory Visit: Payer: Self-pay | Admitting: Radiology

## 2022-04-14 DIAGNOSIS — D539 Nutritional anemia, unspecified: Secondary | ICD-10-CM

## 2022-04-15 ENCOUNTER — Ambulatory Visit (HOSPITAL_COMMUNITY)
Admission: RE | Admit: 2022-04-15 | Discharge: 2022-04-15 | Disposition: A | Discharge status: Home / Self Care | Payer: Medicare PPO | Source: Ambulatory Visit | Attending: Physician Assistant | Admitting: Physician Assistant

## 2022-04-15 ENCOUNTER — Encounter (HOSPITAL_COMMUNITY): Payer: Self-pay

## 2022-04-15 DIAGNOSIS — E039 Hypothyroidism, unspecified: Secondary | ICD-10-CM | POA: Insufficient documentation

## 2022-04-15 DIAGNOSIS — K760 Fatty (change of) liver, not elsewhere classified: Secondary | ICD-10-CM | POA: Insufficient documentation

## 2022-04-15 DIAGNOSIS — D539 Nutritional anemia, unspecified: Secondary | ICD-10-CM

## 2022-04-15 DIAGNOSIS — Z87891 Personal history of nicotine dependence: Secondary | ICD-10-CM | POA: Diagnosis not present

## 2022-04-15 DIAGNOSIS — R161 Splenomegaly, not elsewhere classified: Secondary | ICD-10-CM | POA: Diagnosis not present

## 2022-04-15 DIAGNOSIS — Z1379 Encounter for other screening for genetic and chromosomal anomalies: Secondary | ICD-10-CM | POA: Diagnosis not present

## 2022-04-15 DIAGNOSIS — Z833 Family history of diabetes mellitus: Secondary | ICD-10-CM | POA: Diagnosis not present

## 2022-04-15 DIAGNOSIS — E119 Type 2 diabetes mellitus without complications: Secondary | ICD-10-CM | POA: Insufficient documentation

## 2022-04-15 DIAGNOSIS — E785 Hyperlipidemia, unspecified: Secondary | ICD-10-CM | POA: Diagnosis not present

## 2022-04-15 DIAGNOSIS — M810 Age-related osteoporosis without current pathological fracture: Secondary | ICD-10-CM | POA: Insufficient documentation

## 2022-04-15 DIAGNOSIS — D72822 Plasmacytosis: Secondary | ICD-10-CM | POA: Insufficient documentation

## 2022-04-15 DIAGNOSIS — Z8249 Family history of ischemic heart disease and other diseases of the circulatory system: Secondary | ICD-10-CM | POA: Insufficient documentation

## 2022-04-15 HISTORY — PX: IR BONE MARROW BIOPSY & ASPIRATION: IMG5727

## 2022-04-15 LAB — CBC WITH DIFFERENTIAL/PLATELET
Abs Immature Granulocytes: 0.04 10*3/uL (ref 0.00–0.07)
Basophils Absolute: 0.1 10*3/uL (ref 0.0–0.1)
Basophils Relative: 2 %
Eosinophils Absolute: 0.3 10*3/uL (ref 0.0–0.5)
Eosinophils Relative: 5 %
HCT: 30.9 % — ABNORMAL LOW (ref 36.0–46.0)
Hemoglobin: 10.1 g/dL — ABNORMAL LOW (ref 12.0–15.0)
Immature Granulocytes: 1 %
Lymphocytes Relative: 40 %
Lymphs Abs: 2 10*3/uL (ref 0.7–4.0)
MCH: 34.9 pg — ABNORMAL HIGH (ref 26.0–34.0)
MCHC: 32.7 g/dL (ref 30.0–36.0)
MCV: 106.9 fL — ABNORMAL HIGH (ref 80.0–100.0)
Monocytes Absolute: 0.5 10*3/uL (ref 0.1–1.0)
Monocytes Relative: 9 %
Neutro Abs: 2.2 10*3/uL (ref 1.7–7.7)
Neutrophils Relative %: 43 %
Platelets: 276 10*3/uL (ref 150–400)
RBC: 2.89 MIL/uL — ABNORMAL LOW (ref 3.87–5.11)
RDW: 16 % — ABNORMAL HIGH (ref 11.5–15.5)
WBC: 5.1 10*3/uL (ref 4.0–10.5)
nRBC: 0.8 % — ABNORMAL HIGH (ref 0.0–0.2)

## 2022-04-15 LAB — GLUCOSE, CAPILLARY: Glucose-Capillary: 125 mg/dL — ABNORMAL HIGH (ref 70–99)

## 2022-04-15 MED ORDER — SODIUM CHLORIDE 0.9 % IV SOLN: INTRAVENOUS | Status: DC

## 2022-04-15 MED ORDER — LIDOCAINE HCL (PF) 1 % IJ SOLN
INTRAMUSCULAR | Status: AC
  Filled 2022-04-15: qty 30

## 2022-04-15 MED ORDER — FENTANYL CITRATE (PF) 100 MCG/2ML IJ SOLN
INTRAMUSCULAR | Status: AC
  Filled 2022-04-15: qty 2

## 2022-04-15 MED ORDER — MIDAZOLAM HCL 2 MG/2ML IJ SOLN
INTRAMUSCULAR | Status: AC
  Filled 2022-04-15: qty 2

## 2022-04-15 MED ORDER — FENTANYL CITRATE (PF) 100 MCG/2ML IJ SOLN
INTRAMUSCULAR | Status: AC | PRN
  Administered 2022-04-15 (×2): 50 ug via INTRAVENOUS

## 2022-04-15 MED ORDER — MIDAZOLAM HCL 2 MG/2ML IJ SOLN
INTRAMUSCULAR | Status: AC | PRN
  Administered 2022-04-15: 1.5 mg via INTRAVENOUS
  Administered 2022-04-15: .5 mg via INTRAVENOUS

## 2022-04-15 NOTE — Discharge Instructions (Signed)
Bone Marrow Aspiration and Bone Marrow Biopsy, Adult, Care After  The following information offers guidance on how to care for yourself after your procedure. Your health care provider may also give you more specific instructions. If you have problems or questions, contact your health care provider.  What can I expect after the procedure?  May remove dressing or bandaid and shower tomorrow.  Keep site clean and dry. Replace with clean dressing or bandaid as necessary. Urgent needs IR clinic [phone] (mon-fri 8-5).  After the procedure, it is common to have: Mild pain and tenderness. Swelling. Bruising. Follow these instructions at home: Incision care  Follow instructions from your health care provider about how to take care of the incision site. Make sure you: Wash your hands with soap and water for at least 20 seconds before and after you change your bandage (dressing). If soap and water are not available, use hand sanitizer. Change your dressing as told by your health care provider. Leave stitches (sutures), skin glue, or adhesive strips in place. These skin closures may need to stay in place for 2 weeks or longer. If adhesive strip edges start to loosen and curl up, you may trim the loose edges. Do not remove adhesive strips completely unless your health care provider tells you to do that. Check your incision site every day for signs of infection. Check for: More redness, swelling, or pain. Fluid or blood. Warmth. Pus or a bad smell. Activity Return to your normal activities as told by your health care provider. Ask your health care provider what activities are safe for you. Do not lift anything that is heavier than 10 lb (4.5 kg), or the limit that you are told, until your health care provider says that it is safe. If you were given a sedative during the procedure, it can affect you for  several hours. Do not drive or operate machinery until your health care provider says that it is safe. General instructions  Take over-the-counter and prescription medicines only as told by your health care provider. Do not take baths, swim, or use a hot tub until your health care provider approves. Ask your health care provider if you may take showers. You may only be allowed to take sponge baths. If directed, put ice on the affected area. To do this: Put ice in a plastic bag. Place a towel between your skin and the bag. Leave the ice on for 20 minutes, 2-3 times a day. If your skin turns bright red, remove the ice right away to prevent skin damage. The risk of skin damage is higher if you cannot feel pain, heat, or cold. Contact a health care provider if: You have signs of infection. Your pain is not controlled with medicine. You have cancer, and a temperature of 100.4F (38C) or higher. Get help right away if: You have a temperature of 101F (38.3C) or higher, or as told by your health care provider. You have bleeding from the incision site that cannot be controlled. This information is not intended to replace advice given to you by your health care provider. Make sure you discuss any questions you have with your health care provider. Document Revised: 05/25/2021 Document Reviewed: 05/25/2021 Elsevier Patient Education  2023 [employer]                             

## 2022-04-15 NOTE — Consult Note (Signed)
Chief Complaint: Patient was seen in consultation today for  image guided bone marrow biopsy  Referring Physician(s): 81 M  Supervising Physician: Ruthann Cancer  Patient Status: [facility] - Out-pt  History of Present Illness: Kelly Cook is an 82 y.o. female with past medical history of diabetes, nephrolithiasis, hypothyroidism, osteoporosis, hyperlipidemia, obesity, carotid artery stenosis , hepatic steatosis and mild splenomegaly on imaging and progressive macrocytic anemia. She is scheduled today for image guided bone marrow biopsy to rule out MDS.  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   Coccyx feels prominent and deviated to left, will try NSAID, if not better xray   Constipation 06/16/2012   Diabetes mellitus    Gall bladder stones    Hemorrhoids    History of kidney stones    Hypothyroid 06/16/2012   Hypothyroid 06/16/2012   Osteopenia    Osteoporosis    Otitis externa 12/01/2012   Shingles    Urinary incontinence 07/02/2015    Past Surgical History:  Procedure Laterality Date   ABDOMINAL HYSTERECTOMY     dysfunctional uterine bleeding   APPENDECTOMY     CATARACT EXTRACTION W/PHACO Left 11/14/2017   Procedure: CATARACT EXTRACTION PHACO AND INTRAOCULAR LENS PLACEMENT ([place]);  Surgeon: Tonny Branch, MD;  Location: [facility];  Service: Ophthalmology;  Laterality: Left;  CDE: 9.02   CATARACT EXTRACTION W/PHACO Right 11/28/2017   Procedure: CATARACT EXTRACTION PHACO AND INTRAOCULAR LENS PLACEMENT RIGHT EYE CDE=10.42;  Surgeon: Tonny Branch, MD;  Location: [facility];  Service: Ophthalmology;  Laterality: Right;  right   CHOLECYSTECTOMY N/A 06/20/2019   Procedure: LAPAROSCOPIC CHOLECYSTECTOMY;  Surgeon: Virl Cagey, MD;  Location: [facility];  Service: General;  Laterality: N/A;   COLONOSCOPY N/A 11/27/2015   Procedure: COLONOSCOPY;  Surgeon: Rogene Houston, MD;  Location: [facility];  Service: Endoscopy;  Laterality: N/A;  730    EXTRACORPOREAL SHOCK WAVE LITHOTRIPSY Left 11/01/2016   Procedure: LEFT EXTRACORPOREAL SHOCK WAVE LITHOTRIPSY (ESWL);  Surgeon: Festus Aloe, MD;  Location: [facility];  Service: Urology;  Laterality: Left;   salivary tumor     TONSILLECTOMY     adenoids   TUBAL LIGATION      Allergies: Patient has no known allergies.  Medications: Prior to Admission medications   Medication Sig Start Date End Date Taking? Authorizing Provider  1st Choice Lancets Ultra Thin MISC  03/14/12  Yes [provider]  ACCU-CHEK AVIVA PLUS test strip USE TO TEST BLOOD SUGAR EVERY DAY 05/15/19  Yes [provider]  aspirin EC 81 MG tablet Take 81 mg by mouth daily. Swallow whole.   Yes [provider]  Cholecalciferol (DIALYVITE VITAMIN D 5000) 125 MCG (5000 UT) capsule Take 5,000 Units by mouth at bedtime.   Yes [provider]  levothyroxine (SYNTHROID) 88 MCG tablet Take 88 mcg by mouth daily before breakfast.  05/19/19  Yes [provider]  metFORMIN (GLUCOPHAGE-XR) 500 MG 24 hr tablet Take 500 mg by mouth every evening.   Yes [provider]  acetaminophen (TYLENOL) 500 MG tablet Take 1,000 mg by mouth every 6 (six) hours as needed for moderate pain.     [provider]  ascorbic acid (VITAMIN C) 500 MG tablet Take 500 mg by mouth at bedtime.    [provider]     Family History  Problem Relation Age of Onset   Cancer Father    Dementia Mother    Diabetes Brother    Heart disease Brother  Mental retardation Daughter        in group home    Social History   Socioeconomic History   Marital status: Married    Spouse name: Not on file   Number of children: Not on file   Years of education: Not on file   Highest education level: Not on file  Occupational History   Not on file  Tobacco Use   Smoking status: Former    Years: 24    Types: Cigarettes   Smokeless tobacco: Never  Vaping Use   Vaping Use: Never used  Substance  and Sexual Activity   Alcohol use: No   Drug use: No   Sexual activity: Not Currently    Birth control/protection: Surgical    Comment: hyst  Other Topics Concern   Not on file  Social History Narrative   Not on file   Social Determinants of Health   Financial Resource Strain: Not on file  Food Insecurity: No Food Insecurity (02/22/2022)   Hunger Vital Sign    Worried About Running Out of Food in the Last Year: Never true    Ran Out of Food in the Last Year: Never true  Transportation Needs: No Transportation Needs (02/22/2022)   PRAPARE - Hydrologist (Medical): No    Lack of Transportation (Non-Medical): No  Physical Activity: Not on file  Stress: Not on file  Social Connections: Not on file      Review of Systems denies fever,HA, CP,dyspnea, cough, abd/back pain,N/V or bleeding  Vital Signs:temp 97.5  BP 158/72  HR 77  R 18  O2 SATS 96% RA    Code Status: FULL CODE   Physical Exam: awake/alert; chest- CTA bilat; heart- RRR; abd- soft,+BS,NT; no LE edema  Imaging: US Abdomen Limited  Result Date: 03/26/2022 CLINICAL DATA:  Macrocytosis EXAM: ULTRASOUND ABDOMEN LIMITED RIGHT UPPER QUADRANT COMPARISON:  05/18/2019 FINDINGS: Gallbladder: Status post cholecystectomy Common bile duct: Diameter: 4 mm Liver: Parenchymal echogenicity: Diffusely increased Contours: Normal Lesions: None Portal vein: Patent.  Hepatopetal flow Other: Spleen is mildly enlarged measuring 11.8 cm in length and volume of 444 cc IMPRESSION: 1. Hepatic steatosis. 2. Mild splenomegaly. Electronically Signed   By: Miachel Roux M.D.   On: 03/26/2022 11:04    Labs:  CBC: Recent Labs    02/22/22 1400  WBC 7.3  HGB 10.7*  HCT 32.8*  PLT 311    COAGS: No results for input(s): "INR", "APTT" in the last 8760 hours.  BMP: Recent Labs    02/22/22 1400  NA 138  K 3.6  CL 105  CO2 24  GLUCOSE 122*  BUN 18  CALCIUM 9.2  CREATININE 0.85  GFRNONAA >60    LIVER  FUNCTION TESTS: Recent Labs    02/22/22 1400  BILITOT 0.9  AST 18  ALT 21  ALKPHOS 38  PROT 7.9  ALBUMIN 4.2    TUMOR MARKERS: No results for input(s): "AFPTM", "CEA", "CA199", "CHROMGRNA" in the last 8760 hours.  Assessment and Plan: 82 y.o. female with past medical history of diabetes, nephrolithiasis, hypothyroidism, osteoporosis, hyperlipidemia, obesity, carotid artery stenosis , hepatic steatosis and mild splenomegaly on imaging and progressive macrocytic anemia. She is scheduled today for image guided bone marrow biopsy to rule out MDS.Risks and benefits of procedure was discussed with the patient  including, but not limited to bleeding, infection, damage to adjacent structures or low yield requiring additional tests.  All of the questions were answered and there is  agreement to proceed.  Consent signed and in chart.    Thank you for this interesting consult.  I greatly enjoyed meeting TERRELL WOBIG and look forward to participating in their care.  A copy of this report was sent to the requesting provider on this date.  Electronically Signed: D. Rowe Robert, PA-C 04/15/2022, 8:44 AM   I spent a total of  20 minutes   in face to face in clinical consultation, greater than 50% of which was counseling/coordinating care for image guided bone marrow biopsy

## 2022-04-20 LAB — SURGICAL PATHOLOGY

## 2022-04-20 NOTE — Progress Notes (Signed)
Kelly Cook, Kelly Cook    Clinic Day:  04/21/2022  Referring physician: Celene Squibb, MD  Patient Care Team: Celene Squibb, MD as PCP - General (Internal Medicine) Derek Jack, MD as Consulting Physician (Hematology)   ASSESSMENT & PLAN:   Assessment: 1.  MDS with ring sideroblasts: - Seen at the request of Valentino Nose, NP for macrocytic anemia. - Nutritional deficiency workup was negative.  Myeloma workup was negative.  Kappa light chains are mildly elevated at 45 with ratio of 1.78. - US abdomen (03/26/2022): Spleen is mildly enlarged measuring 11.8 cm in length and volume of 444 cc. - Serum EPO: 57.7 - BMBX (04/15/2022): Hypercellular for age with dyspoietic changes associated with ring sideroblasts, more than 15% of erythroid precursors.  No increase in blasts noted.  No monoclonal B-cell population.  No increase in plasma cells.   2.  Other history - PMH: Hypothyroidism, diabetes, hyperlipidemia, obesity, carotid artery stenosis, osteoporosis  - SOCIAL: She lives at home with her husband and is fully independent. 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 She ambulates without assistive device, keeps up with her ADLs, and stays active by taking daily walks. She has a daughter who is mentally handicapped and lives in a group home. She is a former smoker (smoked about 1 PPD x 30 years), quit in 1983. She denies any alcohol or illicit drug use.  - FAMILY: Father had lymphoma  Plan: 1.  MDS with ring sideroblasts: - She has mild macrocytic anemia with hemoglobin around 10.  White count and platelet count is normal with normal differential.  I have also discussed the results of the ultrasound of the abdomen which showed splenomegaly. - We reviewed bone marrow biopsy results which showed myelodysplastic changes. - I have discussed the new diagnosis in detail.  I waiting on chromosome analysis and MDS FISH panel  results.  If the anemia is worse, Luspatercept is an option. - I will go ahead and order intelligen myeloid panel (NGS) today. - She will come back in 4 weeks to discuss results.    Orders Placed This Encounter  Procedures   IntelliGEN Myeloid    Standing Status:   Future    Number of Occurrences:   1    Standing Expiration Date:   04/21/2023   Direct antiglobulin test    Standing Status:   Future    Number of Occurrences:   1    Standing Expiration Date:   04/21/2023      I,Katie Daubenspeck,acting as a scribe for Derek Jack, MD.,have documented all relevant documentation on the behalf of Derek Jack, MD,as directed by  Derek Jack, MD while in the presence of Derek Jack, MD.   I, Derek Jack MD, have reviewed the above documentation for accuracy and completeness, and I agree with the above.   Derek Jack, MD   3/20/202412:49 PM  CHIEF COMPLAINT:   Diagnosis: Myelodysplastic syndrome with ring sideroblasts  Prior Therapy: none  Current Therapy: Under workup   HISTORY OF PRESENT ILLNESS:   Review of labs in EMR shows mild macrocytic anemia since at least 2021 (normal hemoglobin in 2018) with Hgb 11.3 and MCV 102.4. Lab trend from PCP reveals worsening macrocytic anemia over the past 2 years. (06/20/2020) Hgb 11.0/MCV 104, WBC 5.1, platelets 291, nRBC 1% (12/24/2020) Hgb 10.6/MCV 102, WBC 5.7, platelets 296, nRBC 1% (07/08/2021) Hgb 10.6/MCV 104, WBC 6.1, platelets 298, nRBC 2% (  01/05/2022) Hgb 9.9/MCV 105, WBC 5.2, platelets 275, nRBC 1% Additional labs from 01/05/2022 show mildly elevated TSH 4.68 with normal T41.31. Nutritional panel (01/14/2022) showed normal B12 0000000 and normal folic acid 6.7; iron saturation 61% with elevated serum iron 157.  Kidney function appears normal with creatinine 0.88 and GFR 67.  INTERVAL HISTORY:   Kelly Cook is a 82 y.o. female presenting to clinic today for follow up of macrocytic anemia.  She was last seen by PA Rebekah on 03/16/22.  Since her last visit, she underwent abdomen US on 03/26/22 showing: hepatic steatosis; mild splenomegaly.  She also underwent bone marrow biopsy on 04/15/22. Pathology from the procedure showed: hypercellular bone marrow with dyspoietic changes; slight polyclonal plasmacytosis. Flow cytometry showed: no significant CD34 positive blastic population; T cells with nonspecific changes; no monoclonal B-cell population.  Today, she states that she is doing well overall. Her appetite level is at 100%. Her energy level is at 70%. She denies any chest pains.  She has intermittent dizziness for the last 1 month.   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   Coccyx feels prominent and deviated to left, will try NSAID, if not better xray   Constipation 06/16/2012   Diabetes mellitus    Gall bladder stones    Hemorrhoids    History of kidney stones    Hypothyroid 06/16/2012   Hypothyroid 06/16/2012   Osteopenia    Osteoporosis    Otitis externa 12/01/2012   Shingles    Urinary incontinence 07/02/2015    Surgical History: Past Surgical History:  Procedure Laterality Date   ABDOMINAL HYSTERECTOMY     dysfunctional uterine bleeding   APPENDECTOMY     CATARACT EXTRACTION W/PHACO Left 11/14/2017   Procedure: CATARACT EXTRACTION PHACO AND INTRAOCULAR LENS PLACEMENT ([place]);  Surgeon: Tonny Branch, MD;  Location: [facility];  Service: Ophthalmology;  Laterality: Left;  CDE: 9.02   CATARACT EXTRACTION W/PHACO Right 11/28/2017   Procedure: CATARACT EXTRACTION PHACO AND INTRAOCULAR LENS PLACEMENT RIGHT EYE CDE=10.42;  Surgeon: Tonny Branch, MD;  Location: [facility];  Service: Ophthalmology;  Laterality: Right;  right   CHOLECYSTECTOMY N/A 06/20/2019   Procedure: LAPAROSCOPIC CHOLECYSTECTOMY;  Surgeon: Virl Cagey, MD;  Location: [facility];  Service: General;  Laterality: N/A;   COLONOSCOPY N/A  11/27/2015   Procedure: COLONOSCOPY;  Surgeon: Rogene Houston, MD;  [facility];  Service: Endoscopy;  Laterality: N/A;  730   EXTRACORPOREAL SHOCK WAVE LITHOTRIPSY Left 11/01/2016   Procedure: LEFT EXTRACORPOREAL SHOCK WAVE LITHOTRIPSY (ESWL);  Surgeon: Festus Aloe, MD;  Location: [facility];  Service: Urology;  Laterality: Left;   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     TONSILLECTOMY     adenoids   TUBAL LIGATION      Social History: Social History   Socioeconomic History   Marital status: Married    Spouse name: Not on file   Number of children: Not on file   Years of education: Not on file   Highest education level: Not on file  Occupational History   Not on file  Tobacco Use   Smoking status: Former    Years: 24    Types: Cigarettes   Smokeless tobacco: Never  Vaping Use   Vaping Use: Never used  Substance and Sexual Activity   Alcohol use: No   Drug use: No   Sexual activity: Not Currently  Birth control/protection: Surgical    Comment: hyst  Other Topics Concern   Not on file  Social History Narrative   Not on file   Social Determinants of Health   Financial Resource Strain: Not on file  Food Insecurity: No Food Insecurity (02/22/2022)   Hunger Vital Sign    Worried About Running Out of Food in the Last Year: Never true    Ran Out of Food in the Last Year: Never true  Transportation Needs: No Transportation Needs (02/22/2022)   PRAPARE - Hydrologist (Medical): No    Lack of Transportation (Non-Medical): No  Physical Activity: Not on file  Stress: Not on file  Social Connections: Not on file  Intimate Partner Violence: Not At Risk (02/22/2022)   Humiliation, Afraid, Rape, and Kick questionnaire    Fear of Current or Ex-Partner: No    Emotionally Abused: No    Physically Abused: No    Sexually Abused: No    Family History: Family History  Problem Relation Age of Onset   Cancer  Father    Dementia Mother    Diabetes Brother    Heart disease Brother    Mental retardation Daughter        in group home    Current Medications:  Current Outpatient Medications:    1st Choice Lancets Ultra Thin MISC, , Disp: , Rfl:    ACCU-CHEK AVIVA PLUS test strip, USE TO TEST BLOOD SUGAR EVERY DAY, Disp: , Rfl:    acetaminophen (TYLENOL) 500 MG tablet, Take 1,000 mg by mouth every 6 (six) hours as needed for moderate pain. , Disp: , Rfl:    ascorbic acid (VITAMIN C) 500 MG tablet, Take 500 mg by mouth at bedtime., Disp: , Rfl:    aspirin EC 81 MG tablet, Take 81 mg by mouth daily. Swallow whole., Disp: , Rfl:    Cholecalciferol (DIALYVITE VITAMIN D 5000) 125 MCG (5000 UT) capsule, Take 5,000 Units by mouth at bedtime., Disp: , Rfl:    levothyroxine (SYNTHROID) 88 MCG tablet, Take 88 mcg by mouth daily before breakfast. , Disp: , Rfl:    metFORMIN (GLUCOPHAGE-XR) 500 MG 24 hr tablet, Take 500 mg by mouth every evening., Disp: , Rfl:    Allergies: No Known Allergies  REVIEW OF SYSTEMS:   Review of Systems  Constitutional:  Negative for chills, fatigue and fever.  HENT:   Negative for lump/mass, mouth sores, nosebleeds, sore throat and trouble swallowing.   Eyes:  Negative for eye problems.  Respiratory:  Negative for cough and shortness of breath.   Cardiovascular:  Negative for chest pain, leg swelling and palpitations.  Gastrointestinal:  Negative for abdominal pain, constipation, diarrhea, nausea and vomiting.  Genitourinary:  Negative for bladder incontinence, difficulty urinating, dysuria, frequency, hematuria and nocturia.   Musculoskeletal:  Negative for arthralgias, back pain, flank pain, myalgias and neck pain.  Skin:  Negative for itching and rash.  Neurological:  Positive for dizziness. Negative for headaches and numbness.  Hematological:  Does not bruise/bleed easily.  Psychiatric/Behavioral:  Positive for sleep disturbance. Negative for depression and suicidal  ideas. The patient is nervous/anxious.   All other systems reviewed and are negative.    VITALS:   Blood pressure (!) 158/69, pulse 88, temperature 97.7 F (36.5 C), temperature source Oral, resp. rate 18, height 5\' 1"  (1.549 m), weight 158 lb 9.6 oz (71.9 kg), SpO2 100 %.  Wt Readings from Last 3 Encounters:  04/21/22 158 lb 9.6  oz (71.9 kg)  04/15/22 157 lb (71.2 kg)  03/16/22 157 lb 11.2 oz (71.5 kg)    Body mass index is 29.97 kg/m.  Performance status (ECOG): 1 - Symptomatic but completely ambulatory  PHYSICAL EXAM:   Physical Exam Vitals and nursing note reviewed. Exam conducted with a chaperone present.  Constitutional:      Appearance: Normal appearance.  Cardiovascular:     Rate and Rhythm: Normal rate and regular rhythm.     Pulses: Normal pulses.     Heart sounds: Normal heart sounds.  Pulmonary:     Effort: Pulmonary effort is normal.     Breath sounds: Normal breath sounds.  Abdominal:     Palpations: Abdomen is soft. There is no hepatomegaly, splenomegaly or mass.     Tenderness: There is no abdominal tenderness.  Musculoskeletal:     Right lower leg: No edema.     Left lower leg: No edema.  Lymphadenopathy:     Cervical: No cervical adenopathy.     Right cervical: No superficial, deep or posterior cervical adenopathy.    Left cervical: No superficial, deep or posterior cervical adenopathy.     Upper Body:     Right upper body: No supraclavicular or axillary adenopathy.     Left upper body: No supraclavicular or axillary adenopathy.  Neurological:     General: No focal deficit present.     Mental Status: She is alert and oriented to person, place, and time.  Psychiatric:        Mood and Affect: Mood normal.        Behavior: Behavior normal.     LABS:      Latest Ref Rng & Units 04/15/2022    9:00 AM 02/22/2022    2:00 PM 06/18/2019   11:30 AM  CBC  WBC 4.0 - 10.5 K/uL 5.1  7.3  6.8   Hemoglobin 12.0 - 15.0 g/dL 10.1  10.7  11.2   Hematocrit  36.0 - 46.0 % 30.9  32.8  33.3   Platelets 150 - 400 K/uL 276  311  316       Latest Ref Rng & Units 02/22/2022    2:00 PM 06/18/2019   11:30 AM 05/18/2019   12:48 PM  CMP  Glucose 70 - 99 mg/dL 122  127  119   BUN 8 - 23 mg/dL 18  19  17    Creatinine 0.44 - 1.00 mg/dL 0.85  0.87  0.82   Sodium 135 - 145 mmol/L 138  138  140   Potassium 3.5 - 5.1 mmol/L 3.6  3.8  4.4   Chloride 98 - 111 mmol/L 105  108  108   CO2 22 - 32 mmol/L 24  21  22    Calcium 8.9 - 10.3 mg/dL 9.2  8.7  9.1   Total Protein 6.5 - 8.1 g/dL 7.9     Total Bilirubin 0.3 - 1.2 mg/dL 0.9     Alkaline Phos 38 - 126 U/L 38     AST 15 - 41 U/L 18     ALT 0 - 44 U/L 21        No results found for: "CEA1", "CEA" / No results found for: "CEA1", "CEA" No results found for: "PSA1" No results found for: "[id number]" No results found for: "CAN125"  Lab Results  Component Value Date   TOTALPROTELP 7.3 02/22/2022   TOTALPROTELP 7.2 02/22/2022   ALBUMINELP 4.0 02/22/2022   A1GS 0.2 02/22/2022   A2GS 0.5  02/22/2022   BETS 1.1 02/22/2022   GAMS 1.4 02/22/2022   MSPIKE Not Observed 02/22/2022   SPEI Comment 02/22/2022   Lab Results  Component Value Date   FERRITIN 298 02/22/2022   Lab Results  Component Value Date   LDH 117 02/22/2022   Bone Marrow Biopsy 04/15/22:  DIAGNOSIS:   BONE MARROW, ASPIRATE, CLOT, CORE:  -Hypercellular bone marrow with dyspoietic changes  -Slight polyclonal plasmacytosis  -See comment   PERIPHERAL BLOOD:  -Macrocytic anemia  -Mild neutrophilic left shift   COMMENT:  The bone marrow is hypercellular for age with dyspoietic changes associated with abundant ring sideroblasts.  No increase in blastic cells identified.  The findings are very concerning for involvement by a low-grade myelodysplastic syndrome with ring sideroblasts.  The  differential diagnosis includes secondary changes related to lead toxicity, pyridoxine deficiency, alcohol, medication, etc.  Correlation with cytogenetic  and FISH studies is recommended.   STUDIES:   IR BONE MARROW BIOPSY & ASPIRATION  Result Date: 04/15/2022 INDICATION: 82 year old female with history of macrocytic anemia. EXAM: FLUOROSCOPIC-GUIDED BONE MARROW BIOPSY AND ASPIRATION MEDICATIONS: None ANESTHESIA/SEDATION: Fentanyl 100 mcg IV; Versed 2 mg IV Sedation Time: 10 minutes; The patient was continuously monitored during the procedure by the interventional radiology nurse under my direct supervision. COMPLICATIONS: None immediate. PROCEDURE: Informed consent was obtained from the patient following an explanation of the procedure, risks, benefits and alternatives. The patient understands, agrees and consents for the procedure. All questions were addressed. A time out was performed prior to the initiation of the procedure. The patient was positioned prone and the right iliac marrow space was identified fluoroscopically. The operative site was prepped and draped in the usual sterile fashion. Under sterile conditions and local anesthesia, a 22 gauge spinal needle was utilized for procedural planning. Next, an 11 gauge coaxial bone biopsy needle was advanced into the right iliac marrow space. Initially, a bone marrow aspiration was performed. Next, a bone marrow biopsy was obtained with the 11 gauge outer bone marrow device. Samples were prepared with the cytotechnologist and deemed adequate. The needle was removed and superficial hemostasis was obtained with manual compression. A dressing was applied. The patient tolerated the procedure well without immediate post procedural complication. IMPRESSION: Successful fluoroscopic guided right iliac bone marrow aspiration and core biopsy. Ruthann Cancer, MD [facility] Electronically Signed   By: Ruthann Cancer M.D.   On: 04/15/2022 11:09   US Abdomen Limited  Result Date: 03/26/2022 CLINICAL DATA:  Macrocytosis EXAM: ULTRASOUND ABDOMEN LIMITED RIGHT UPPER  QUADRANT COMPARISON:  05/18/2019 FINDINGS: Gallbladder: Status post cholecystectomy Common bile duct: Diameter: 4 mm Liver: Parenchymal echogenicity: Diffusely increased Contours: Normal Lesions: None Portal vein: Patent.  Hepatopetal flow Other: Spleen is mildly enlarged measuring 11.8 cm in length and volume of 444 cc IMPRESSION: 1. Hepatic steatosis. 2. Mild splenomegaly. Electronically Signed   By: Miachel Roux M.D.   On: 03/26/2022 11:04

## 2022-04-21 ENCOUNTER — Inpatient Hospital Stay: Payer: Medicare PPO

## 2022-04-21 ENCOUNTER — Inpatient Hospital Stay: Payer: Medicare PPO | Attending: Hematology | Admitting: Hematology

## 2022-04-21 VITALS — BP 158/69 | HR 88 | Temp 97.7°F | Resp 18 | Ht 61.0 in | Wt 158.6 lb

## 2022-04-21 DIAGNOSIS — Z7984 Long term (current) use of oral hypoglycemic drugs: Secondary | ICD-10-CM | POA: Insufficient documentation

## 2022-04-21 DIAGNOSIS — E669 Obesity, unspecified: Secondary | ICD-10-CM | POA: Insufficient documentation

## 2022-04-21 DIAGNOSIS — Z87891 Personal history of nicotine dependence: Secondary | ICD-10-CM | POA: Insufficient documentation

## 2022-04-21 DIAGNOSIS — E785 Hyperlipidemia, unspecified: Secondary | ICD-10-CM | POA: Diagnosis not present

## 2022-04-21 DIAGNOSIS — Z7982 Long term (current) use of aspirin: Secondary | ICD-10-CM | POA: Insufficient documentation

## 2022-04-21 DIAGNOSIS — E039 Hypothyroidism, unspecified: Secondary | ICD-10-CM | POA: Diagnosis not present

## 2022-04-21 DIAGNOSIS — E119 Type 2 diabetes mellitus without complications: Secondary | ICD-10-CM | POA: Insufficient documentation

## 2022-04-21 DIAGNOSIS — Z79899 Other long term (current) drug therapy: Secondary | ICD-10-CM | POA: Insufficient documentation

## 2022-04-21 DIAGNOSIS — D461 Refractory anemia with ring sideroblasts: Secondary | ICD-10-CM | POA: Diagnosis not present

## 2022-04-21 DIAGNOSIS — D890 Polyclonal hypergammaglobulinemia: Secondary | ICD-10-CM | POA: Insufficient documentation

## 2022-04-21 DIAGNOSIS — D469 Myelodysplastic syndrome, unspecified: Secondary | ICD-10-CM | POA: Diagnosis not present

## 2022-04-21 DIAGNOSIS — D539 Nutritional anemia, unspecified: Secondary | ICD-10-CM | POA: Diagnosis not present

## 2022-04-21 LAB — DIRECT ANTIGLOBULIN TEST (NOT AT ARMC)
DAT, IgG: NEGATIVE
DAT, complement: NEGATIVE

## 2022-04-21 NOTE — Patient Instructions (Addendum)
[place] at [facility] Discharge Instructions   You were seen and examined today by Dr. Delton Coombes.  He reviewed the results of her bone marrow biopsy. 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 This is a condition that inhibits your bone marrow from making red blood cells properly.   We will get additional blood work today.   Return as scheduled.   Thank you for choosing [place] at [facility] to provide your oncology and hematology care.  To afford each patient quality time with our provider, please arrive at least 15 minutes before your scheduled appointment time.   If you have a lab appointment with the [place] please come in thru the Main Entrance and check in at the main information desk.  You need to re-schedule your appointment should you arrive 10 or more minutes late.  We strive to give you quality time with our providers, and arriving late affects you and other patients whose appointments are after yours.  Also, if you no show three or more times for appointments you may be dismissed from the clinic at the providers discretion.     Again, thank you for choosing [facility].  Our hope is that these requests will decrease the amount of time that you wait before being seen by our physicians.       _____________________________________________________________  Should you have questions after your visit to [facility], please contact our office at [phone] and follow the prompts.  Our office hours are 8:00 a.m. and 4:30 p.m. Monday - Friday.  Please note that voicemails left after 4:00 p.m. may not be returned until the following business day.  We are closed weekends and major holidays.  You do have access to a nurse 24-7, just call the main number to the clinic [phone] and do not press any options, hold on the line and a nurse will answer the phone.     For prescription refill requests, have your pharmacy contact our office and allow 72 hours.    Due to Covid, you will need to wear a mask upon entering the hospital. If you do not have a mask, a mask will be given to you at the Main Entrance upon arrival. For doctor visits, patients may have 1 support person age 20 or older with them. For treatment visits, patients can not have anyone with them due to social distancing guidelines and our immunocompromised population.

## 2022-04-26 ENCOUNTER — Encounter (HOSPITAL_COMMUNITY): Payer: Self-pay | Admitting: Physician Assistant

## 2022-04-29 ENCOUNTER — Telehealth: Payer: Self-pay | Admitting: *Deleted

## 2022-04-29 NOTE — Telephone Encounter (Signed)
Has developed quarter sized raised areas on different areas of the skin. Has been using hydrocortisone cream. Says they are inflamed and itch terribly. Seems to get better with the cream. We do not have her on any medication. I suggested a dermatologist and Dr. Delton Coombes agreed with plan.  Patient will make appointment, as she is established with Dr. Nevada Crane.

## 2022-05-07 LAB — INTELLIGEN MYELOID

## 2022-05-13 DIAGNOSIS — R21 Rash and other nonspecific skin eruption: Secondary | ICD-10-CM | POA: Diagnosis not present

## 2022-05-13 DIAGNOSIS — D469 Myelodysplastic syndrome, unspecified: Secondary | ICD-10-CM | POA: Diagnosis not present

## 2022-05-17 NOTE — Progress Notes (Signed)
[facility] [address] [place] [address]    Clinic Day:  05/18/2022  Referring physician: Benita Stabile, MD  Patient Care Team: Benita Stabile, MD as PCP - General (Internal Medicine) Doreatha Massed, MD as Consulting Physician (Hematology)   ASSESSMENT & PLAN:   Assessment: 1.  MDS with ring sideroblasts: - Seen at the request of [name], NP for macrocytic anemia. - Nutritional deficiency workup was negative.  Myeloma workup was negative.  Kappa light chains are mildly elevated at 45 with ratio of 1.78. - US abdomen (03/26/2022): Spleen is mildly enlarged measuring 11.8 cm in length and volume of 444 cc. - Serum EPO: 57.7 - BMBX (04/15/2022): Hypercellular for age with dyspoietic changes associated with ring sideroblasts, more than 15% of erythroid precursors.  No increase in blasts noted.  No monoclonal B-cell population.  No increase in plasma cells. - NGS: SF3B1 - MDS FISH: Normal.  Cytogenetics: 46, XX[20]   2.  Other history - PMH: Hypothyroidism, diabetes, hyperlipidemia, obesity, carotid artery stenosis, osteoporosis  - SOCIAL: She lives at home with her husband and is fully independent. She is retired from work as Librarian, academic of a Development worker, international aid. She ambulates without assistive device, keeps up with her ADLs, and stays active by taking daily walks. She has a daughter who is mentally handicapped and lives in a group home. She is a former smoker (smoked about 1 PPD x 30 years), quit in 1983. She denies any alcohol or illicit drug use.  - FAMILY: Father had lymphoma  Plan: 1.  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 - We also discussed MDS FISH panel which was normal.  Chromosome analysis was normal. - We have done CBC today which showed hemoglobin 10.9.  White count and platelet count was normal. - She is feeling very well. - I  have recommended that we start Luspatercept if her hemoglobin drops below 10. - We discussed side effects of Luspatercept including hypertension, thrombocytopenia, GI side effects among others. - We will schedule her for follow-up visit in 3 months with repeat CBC.  2.  Skin rash: - She has developed erythematous maculopapular rash which is intensely itching about 3 weeks ago. - She was seen by her PMD Dr. Margo Aye and was prescribed prednisone 20 mg for 5 days which helped.  She is also taking Atarax. - She still has new lesions which came up on the buttock region. - She was told to keep her appointment with dermatology tomorrow.    Orders Placed This Encounter  Procedures   CBC    Standing Status:   Future    Number of Occurrences:   1    Standing Expiration Date:   05/18/2023     I,Alexis Herring,acting as a scribe for Doreatha Massed, MD.,have documented all relevant documentation on the behalf of Doreatha Massed, MD,as directed by  Doreatha Massed, MD while in the presence of Doreatha Massed, MD.  I, Doreatha Massed MD, have reviewed the above documentation for accuracy and completeness, and I agree with the above.    Doreatha Massed, MD   4/16/20245:20 PM  CHIEF COMPLAINT:   Diagnosis: Myelodysplastic syndrome with ring sideroblasts  Prior Therapy: none  Current Therapy: Luspatercept  HISTORY OF PRESENT ILLNESS:   Review of labs in EMR shows mild macrocytic anemia since at least 2021 (normal hemoglobin in 2018) with Hgb 11.3  and MCV 102.4. Lab trend from PCP reveals worsening macrocytic anemia over the past 2 years. (06/20/2020) Hgb 11.0/MCV 104, WBC 5.1, platelets 291, nRBC 1% (12/24/2020) Hgb 10.6/MCV 102, WBC 5.7, platelets 296, nRBC 1% (07/08/2021) Hgb 10.6/MCV 104, WBC 6.1, platelets 298, nRBC 2% (01/05/2022) Hgb 9.9/MCV 105, WBC 5.2, platelets 275, nRBC 1% Additional labs from 01/05/2022 show mildly elevated TSH 4.68 with normal  T41.31. Nutritional panel (01/14/2022) showed normal B12 559 and normal folic acid 6.7; iron saturation 61% with elevated serum iron 157.  Kidney function appears normal with creatinine 0.88 and GFR 67.  INTERVAL HISTORY:   Kelly Cook is a 82 y.o. female presenting to clinic today for follow up of macrocytic anemia. She was last seen by me on 04/21/22.  Today, she states that she is doing well overall. Her appetite level is at 100%. Her energy level is at 70%.   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   Coccyx feels prominent and deviated to left, will try NSAID, if not better xray   Constipation 06/16/2012   Diabetes mellitus    Gall bladder stones    Hemorrhoids    History of kidney stones    Hypothyroid 06/16/2012   Hypothyroid 06/16/2012   Osteopenia    Osteoporosis    Otitis externa 12/01/2012   Shingles    Urinary incontinence 07/02/2015    Surgical History: Past Surgical History:  Procedure Laterality Date   ABDOMINAL HYSTERECTOMY     dysfunctional uterine bleeding   APPENDECTOMY     CATARACT EXTRACTION W/PHACO Left 11/14/2017   Procedure: CATARACT EXTRACTION PHACO AND INTRAOCULAR LENS PLACEMENT (IOC);  Surgeon: Gemma Payor, MD;  Location: [facility];  Service: Ophthalmology;  Laterality: Left;  CDE: 9.02   CATARACT EXTRACTION W/PHACO Right 11/28/2017   Procedure: CATARACT EXTRACTION PHACO AND INTRAOCULAR LENS PLACEMENT RIGHT EYE CDE=10.42;  Surgeon: Gemma Payor, MD;  Location: [facility];  Service: Ophthalmology;  Laterality: Right;  right   CHOLECYSTECTOMY N/A 06/20/2019   Procedure: LAPAROSCOPIC CHOLECYSTECTOMY;  Surgeon: Lucretia Roers, MD;  Location: [facility];  Service: General;  Laterality: N/A;   COLONOSCOPY N/A 11/27/2015   Procedure: COLONOSCOPY;  Surgeon: Malissa Hippo, MD;  Location: [facility];  Service: Endoscopy;  Laterality: N/A;  730   EXTRACORPOREAL SHOCK WAVE LITHOTRIPSY Left 11/01/2016    Procedure: LEFT EXTRACORPOREAL SHOCK WAVE LITHOTRIPSY (ESWL);  Surgeon: Jerilee Field, MD;  Location: [facility];  Service: Urology;  Laterality: Left;   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     TONSILLECTOMY     adenoids   TUBAL LIGATION      Social History: Social History   Socioeconomic History   Marital status: Married    Spouse name: Not on file   Number of children: Not on file   Years of education: Not on file   Highest education level: Not on file  Occupational History   Not on file  Tobacco Use   Smoking status: Former    Years: 24    Types: Cigarettes   Smokeless tobacco: Never  Vaping Use   Vaping Use: Never used  Substance and Sexual Activity   Alcohol use: No   Drug use: No   Sexual activity: Not Currently    Birth control/protection: Surgical    Comment: hyst  Other Topics Concern   Not on file  Social History Narrative   Not on file  Social Determinants of Health   Financial Resource Strain: Not on file  Food Insecurity: No Food Insecurity (02/22/2022)   Hunger Vital Sign    Worried About Running Out of Food in the Last Year: Never true    Ran Out of Food in the Last Year: Never true  Transportation Needs: No Transportation Needs (02/22/2022)   PRAPARE - Administrator, Civil Service (Medical): No    Lack of Transportation (Non-Medical): No  Physical Activity: Not on file  Stress: Not on file  Social Connections: Not on file  Intimate Partner Violence: Not At Risk (02/22/2022)   Humiliation, Afraid, Rape, and Kick questionnaire    Fear of Current or Ex-Partner: No    Emotionally Abused: No    Physically Abused: No    Sexually Abused: No    Family History: Family History  Problem Relation Age of Onset   Cancer Father    Dementia Mother    Diabetes Brother    Heart disease Brother    Mental retardation Daughter        in group home    Current Medications:  Current Outpatient Medications:    1st  Choice Lancets Ultra Thin MISC, , Disp: , Rfl:    ACCU-CHEK AVIVA PLUS test strip, USE TO TEST BLOOD SUGAR EVERY DAY, Disp: , Rfl:    acetaminophen (TYLENOL) 500 MG tablet, Take 1,000 mg by mouth every 6 (six) hours as needed for moderate pain. , Disp: , Rfl:    ascorbic acid (VITAMIN C) 500 MG tablet, Take 500 mg by mouth at bedtime., Disp: , Rfl:    aspirin EC 81 MG tablet, Take 81 mg by mouth daily. Swallow whole., Disp: , Rfl:    Cholecalciferol (DIALYVITE VITAMIN D 5000) 125 MCG (5000 UT) capsule, Take 5,000 Units by mouth at bedtime., Disp: , Rfl:    hydrOXYzine (ATARAX) 25 MG tablet, Take 25 mg by mouth 3 (three) times daily as needed., Disp: , Rfl:    levothyroxine (SYNTHROID) 88 MCG tablet, Take 88 mcg by mouth daily before breakfast. , Disp: , Rfl:    metFORMIN (GLUCOPHAGE-XR) 500 MG 24 hr tablet, Take 500 mg by mouth every evening., Disp: , Rfl:    Allergies: No Known Allergies  REVIEW OF SYSTEMS:   Review of Systems  Constitutional:  Negative for chills, fatigue and fever.  HENT:   Negative for lump/mass, mouth sores, nosebleeds, sore throat and trouble swallowing.   Eyes:  Negative for eye problems.  Respiratory:  Negative for cough and shortness of breath.   Cardiovascular:  Negative for chest pain, leg swelling and palpitations.  Gastrointestinal:  Negative for abdominal pain, constipation, diarrhea, nausea and vomiting.  Genitourinary:  Negative for bladder incontinence, difficulty urinating, dysuria, frequency, hematuria and nocturia.   Musculoskeletal:  Negative for arthralgias, back pain, flank pain, myalgias and neck pain.  Skin:  Negative for itching and rash.  Neurological:  Positive for dizziness and headaches. Negative for numbness.  Hematological:  Does not bruise/bleed easily.  Psychiatric/Behavioral:  Negative for depression, sleep disturbance and suicidal ideas. The patient is not nervous/anxious.   All other systems reviewed and are negative.    VITALS:    Blood pressure (!) 143/67, pulse 77, temperature (!) 96.9 F (36.1 C), temperature source Tympanic, resp. rate 18, weight 156 lb 12 oz (71.1 kg), SpO2 97 %.  Wt Readings from Last 3 Encounters:  05/18/22 156 lb 12 oz (71.1 kg)  04/21/22 158 lb 9.6 oz (71.9 kg)  04/15/22 157 lb (71.2 kg)    Body mass index is 29.62 kg/m.  Performance status (ECOG): 1 - Symptomatic but completely ambulatory  PHYSICAL EXAM:   Physical Exam Vitals and nursing note reviewed. Exam conducted with a chaperone present.  Constitutional:      Appearance: Normal appearance.  Cardiovascular:     Rate and Rhythm: Normal rate and regular rhythm.     Pulses: Normal pulses.     Heart sounds: Normal heart sounds.  Pulmonary:     Effort: Pulmonary effort is normal.     Breath sounds: Normal breath sounds.  Abdominal:     Palpations: Abdomen is soft. There is no hepatomegaly, splenomegaly or mass.     Tenderness: There is no abdominal tenderness.  Musculoskeletal:     Right lower leg: No edema.     Left lower leg: No edema.  Lymphadenopathy:     Cervical: No cervical adenopathy.     Right cervical: No superficial, deep or posterior cervical adenopathy.    Left cervical: No superficial, deep or posterior cervical adenopathy.     Upper Body:     Right upper body: No supraclavicular or axillary adenopathy.     Left upper body: No supraclavicular or axillary adenopathy.  Neurological:     General: No focal deficit present.     Mental Status: She is alert and oriented to person, place, and time.  Psychiatric:        Mood and Affect: Mood normal.        Behavior: Behavior normal.     LABS:      Latest Ref Rng & Units 05/18/2022   11:19 AM 04/15/2022    9:00 AM 02/22/2022    2:00 PM  CBC  WBC 4.0 - 10.5 K/uL 10.4  5.1  7.3   Hemoglobin 12.0 - 15.0 g/dL 56.3  87.5  64.3   Hematocrit 36.0 - 46.0 % 32.8  30.9  32.8   Platelets 150 - 400 K/uL 309  276  311       Latest Ref Rng & Units 02/22/2022     2:00 PM 06/18/2019   11:30 AM 05/18/2019   12:48 PM  CMP  Glucose 70 - 99 mg/dL 329  518  841   BUN 8 - 23 mg/dL 18  19  17    Creatinine 0.44 - 1.00 mg/dL 6.60  6.30  1.60   Sodium 135 - 145 mmol/L 138  138  140   Potassium 3.5 - 5.1 mmol/L 3.6  3.8  4.4   Chloride 98 - 111 mmol/L 105  108  108   CO2 22 - 32 mmol/L 24  21  22    Calcium 8.9 - 10.3 mg/dL 9.2  8.7  9.1   Total Protein 6.5 - 8.1 g/dL 7.9     Total Bilirubin 0.3 - 1.2 mg/dL 0.9     Alkaline Phos 38 - 126 U/L 38     AST 15 - 41 U/L 18     ALT 0 - 44 U/L 21        No results found for: "CEA1", "CEA" / No results found for: "CEA1", "CEA" No results found for: "PSA1" No results found for: "FUX323" No results found for: "CAN125"  Lab Results  Component Value Date   TOTALPROTELP 7.3 02/22/2022   TOTALPROTELP 7.2 02/22/2022   ALBUMINELP 4.0 02/22/2022   A1GS 0.2 02/22/2022   A2GS 0.5 02/22/2022   BETS 1.1 02/22/2022   GAMS 1.4 02/22/2022  MSPIKE Not Observed 02/22/2022   SPEI Comment 02/22/2022   Lab Results  Component Value Date   FERRITIN 298 02/22/2022   Lab Results  Component Value Date   LDH 117 02/22/2022   Bone Marrow Biopsy 04/15/22:  DIAGNOSIS:   BONE MARROW, ASPIRATE, CLOT, CORE:  -Hypercellular bone marrow with dyspoietic changes  -Slight polyclonal plasmacytosis  -See comment   PERIPHERAL BLOOD:  -Macrocytic anemia  -Mild neutrophilic left shift   COMMENT:  The bone marrow is hypercellular for age with dyspoietic changes associated with abundant ring sideroblasts.  No increase in blastic cells identified.  The findings are very concerning for involvement by a low-grade myelodysplastic syndrome with ring sideroblasts.  The  differential diagnosis includes secondary changes related to lead toxicity, pyridoxine deficiency, alcohol, medication, etc.  Correlation with cytogenetic and FISH studies is recommended.   STUDIES:   No results found.

## 2022-05-18 ENCOUNTER — Inpatient Hospital Stay: Payer: Medicare PPO | Attending: Hematology | Admitting: Hematology

## 2022-05-18 ENCOUNTER — Inpatient Hospital Stay: Payer: Medicare PPO

## 2022-05-18 VITALS — BP 143/67 | HR 77 | Temp 96.9°F | Resp 18 | Wt 156.7 lb

## 2022-05-18 DIAGNOSIS — Z79899 Other long term (current) drug therapy: Secondary | ICD-10-CM | POA: Diagnosis not present

## 2022-05-18 DIAGNOSIS — D461 Refractory anemia with ring sideroblasts: Secondary | ICD-10-CM | POA: Insufficient documentation

## 2022-05-18 DIAGNOSIS — Z7982 Long term (current) use of aspirin: Secondary | ICD-10-CM | POA: Insufficient documentation

## 2022-05-18 DIAGNOSIS — Z90710 Acquired absence of both cervix and uterus: Secondary | ICD-10-CM | POA: Diagnosis not present

## 2022-05-18 DIAGNOSIS — E119 Type 2 diabetes mellitus without complications: Secondary | ICD-10-CM | POA: Diagnosis not present

## 2022-05-18 DIAGNOSIS — Z87891 Personal history of nicotine dependence: Secondary | ICD-10-CM | POA: Diagnosis not present

## 2022-05-18 DIAGNOSIS — Z7984 Long term (current) use of oral hypoglycemic drugs: Secondary | ICD-10-CM | POA: Diagnosis not present

## 2022-05-18 DIAGNOSIS — E039 Hypothyroidism, unspecified: Secondary | ICD-10-CM | POA: Diagnosis not present

## 2022-05-18 DIAGNOSIS — E785 Hyperlipidemia, unspecified: Secondary | ICD-10-CM | POA: Diagnosis not present

## 2022-05-18 DIAGNOSIS — D469 Myelodysplastic syndrome, unspecified: Secondary | ICD-10-CM

## 2022-05-18 DIAGNOSIS — E669 Obesity, unspecified: Secondary | ICD-10-CM | POA: Insufficient documentation

## 2022-05-18 DIAGNOSIS — D890 Polyclonal hypergammaglobulinemia: Secondary | ICD-10-CM | POA: Diagnosis not present

## 2022-05-18 LAB — CBC
HCT: 32.8 % — ABNORMAL LOW (ref 36.0–46.0)
Hemoglobin: 10.9 g/dL — ABNORMAL LOW (ref 12.0–15.0)
MCH: 34.8 pg — ABNORMAL HIGH (ref 26.0–34.0)
MCHC: 33.2 g/dL (ref 30.0–36.0)
MCV: 104.8 fL — ABNORMAL HIGH (ref 80.0–100.0)
Platelets: 309 10*3/uL (ref 150–400)
RBC: 3.13 MIL/uL — ABNORMAL LOW (ref 3.87–5.11)
RDW: 16.3 % — ABNORMAL HIGH (ref 11.5–15.5)
WBC: 10.4 10*3/uL (ref 4.0–10.5)
nRBC: 1.3 % — ABNORMAL HIGH (ref 0.0–0.2)

## 2022-05-18 NOTE — Patient Instructions (Addendum)
[facility] at [facility] Discharge Instructions   You were seen and examined today by Dr. Ellin Saba.  He reviewed the results of your lab work. He discussed with you the results of the special testing we sent on your blood (IntelliGEN Myeloid Panel). This shows that you have a low grade MDS (myelodysplastic syndrome).   Your low hemoglobin can be corrected by an injection called Reblozyl. This is a shot that we give in the clinic once every 3 weeks. We will give this shot for a hemoglobin less that 10.0. We will check your blood work today to see what your hemoglobin is today.    Thank you for choosing [facility] at [facility] to provide your oncology and hematology care.  To afford each patient quality time with our provider, please arrive at least 15 minutes before your scheduled appointment time.   If you have a lab appointment with the Cancer Center please come in thru the Main Entrance and check in at the main information desk.  You need to re-schedule your appointment should you arrive 10 or more minutes late.  We strive to give you quality time with our providers, and arriving late affects you and other patients whose appointments are after yours.  Also, if you no show three or more times for appointments you may be dismissed from the clinic at the providers discretion.     Again, thank you for choosing [facility].  Our hope is that these requests will decrease the amount of time that you wait before being seen by our physicians.       _____________________________________________________________  Should you have questions after your visit to [facility], please contact our office at [phone] and follow the prompts.  Our office hours are 8:00 a.m. and 4:30 p.m. Monday - Friday.  Please note that voicemails left after 4:00 p.m. may not be returned until the following business day.  We are closed weekends and  major holidays.  You do have access to a nurse 24-7, just call the main number to the clinic [phone] and do not press any options, hold on the line and a nurse will answer the phone.    For prescription refill requests, have your pharmacy contact our office and allow 72 hours.    Due to Covid, you will need to wear a mask upon entering the hospital. If you do not have a mask, a mask will be given to you at the Main Entrance upon arrival. For doctor visits, patients may have 1 support person age 81 or older with them. For treatment visits, patients can not have anyone with them due to social distancing guidelines and our immunocompromised population.

## 2022-05-19 ENCOUNTER — Ambulatory Visit: Payer: Medicare PPO | Admitting: Hematology

## 2022-06-07 DIAGNOSIS — L308 Other specified dermatitis: Secondary | ICD-10-CM | POA: Diagnosis not present

## 2022-07-09 DIAGNOSIS — K760 Fatty (change of) liver, not elsewhere classified: Secondary | ICD-10-CM | POA: Diagnosis not present

## 2022-07-09 DIAGNOSIS — E1122 Type 2 diabetes mellitus with diabetic chronic kidney disease: Secondary | ICD-10-CM | POA: Diagnosis not present

## 2022-07-09 DIAGNOSIS — E039 Hypothyroidism, unspecified: Secondary | ICD-10-CM | POA: Diagnosis not present

## 2022-07-09 DIAGNOSIS — E669 Obesity, unspecified: Secondary | ICD-10-CM | POA: Diagnosis not present

## 2022-07-09 DIAGNOSIS — D469 Myelodysplastic syndrome, unspecified: Secondary | ICD-10-CM | POA: Diagnosis not present

## 2022-07-09 DIAGNOSIS — M810 Age-related osteoporosis without current pathological fracture: Secondary | ICD-10-CM | POA: Diagnosis not present

## 2022-07-09 DIAGNOSIS — M1990 Unspecified osteoarthritis, unspecified site: Secondary | ICD-10-CM | POA: Diagnosis not present

## 2022-07-09 DIAGNOSIS — N182 Chronic kidney disease, stage 2 (mild): Secondary | ICD-10-CM | POA: Diagnosis not present

## 2022-07-09 DIAGNOSIS — R32 Unspecified urinary incontinence: Secondary | ICD-10-CM | POA: Diagnosis not present

## 2022-07-15 DIAGNOSIS — E119 Type 2 diabetes mellitus without complications: Secondary | ICD-10-CM | POA: Diagnosis not present

## 2022-07-15 DIAGNOSIS — E782 Mixed hyperlipidemia: Secondary | ICD-10-CM | POA: Diagnosis not present

## 2022-07-15 DIAGNOSIS — D509 Iron deficiency anemia, unspecified: Secondary | ICD-10-CM | POA: Diagnosis not present

## 2022-07-15 DIAGNOSIS — E039 Hypothyroidism, unspecified: Secondary | ICD-10-CM | POA: Diagnosis not present

## 2022-07-21 DIAGNOSIS — R809 Proteinuria, unspecified: Secondary | ICD-10-CM | POA: Diagnosis not present

## 2022-07-21 DIAGNOSIS — E669 Obesity, unspecified: Secondary | ICD-10-CM | POA: Diagnosis not present

## 2022-07-21 DIAGNOSIS — Z0001 Encounter for general adult medical examination with abnormal findings: Secondary | ICD-10-CM | POA: Diagnosis not present

## 2022-07-21 DIAGNOSIS — I6529 Occlusion and stenosis of unspecified carotid artery: Secondary | ICD-10-CM | POA: Diagnosis not present

## 2022-07-21 DIAGNOSIS — E039 Hypothyroidism, unspecified: Secondary | ICD-10-CM | POA: Diagnosis not present

## 2022-07-21 DIAGNOSIS — M8580 Other specified disorders of bone density and structure, unspecified site: Secondary | ICD-10-CM | POA: Diagnosis not present

## 2022-07-21 DIAGNOSIS — R790 Abnormal level of blood mineral: Secondary | ICD-10-CM | POA: Diagnosis not present

## 2022-07-21 DIAGNOSIS — E1122 Type 2 diabetes mellitus with diabetic chronic kidney disease: Secondary | ICD-10-CM | POA: Diagnosis not present

## 2022-07-21 DIAGNOSIS — N189 Chronic kidney disease, unspecified: Secondary | ICD-10-CM | POA: Diagnosis not present

## 2022-07-21 DIAGNOSIS — D649 Anemia, unspecified: Secondary | ICD-10-CM | POA: Diagnosis not present

## 2022-08-02 ENCOUNTER — Other Ambulatory Visit (HOSPITAL_COMMUNITY): Payer: Self-pay | Admitting: Internal Medicine

## 2022-08-02 DIAGNOSIS — Z1231 Encounter for screening mammogram for malignant neoplasm of breast: Secondary | ICD-10-CM

## 2022-08-17 NOTE — Progress Notes (Signed)
[facility] [address] [place] [address]    Clinic Day:  08/18/2022  Referring physician: Benita Stabile, MD  Patient Care Team: Benita Stabile, MD as PCP - General (Internal Medicine) Doreatha Massed, MD as Consulting Physician (Hematology)   ASSESSMENT & PLAN:   Assessment: 1.  MDS with ring sideroblasts: - Seen at the request of Kelly Payor, NP for macrocytic anemia. - Nutritional deficiency workup was negative.  Myeloma workup was negative.  Kappa light chains are mildly elevated at 45 with ratio of 1.78. - US abdomen (03/26/2022): Spleen is mildly enlarged measuring 11.8 cm in length and volume of 444 cc. - Serum EPO: 57.7 - BMBX (04/15/2022): Hypercellular for age with dyspoietic changes associated with ring sideroblasts, more than 15% of erythroid precursors.  No increase in blasts noted.  No monoclonal B-cell population.  No increase in plasma cells. - NGS: SF3B1 - MDS FISH: Normal.  Cytogenetics: 46, XX[20]   2.  Other history - PMH: Hypothyroidism, diabetes, hyperlipidemia, obesity, carotid artery stenosis, osteoporosis  - SOCIAL: She lives at home with her husband and is fully independent. She is retired from work as Librarian, academic of a Development worker, international aid. She ambulates without assistive device, keeps up with her ADLs, and stays active by taking daily walks. She has a daughter who is mentally handicapped and lives in a group home. She is a former smoker (smoked about 1 PPD x 30 years), quit in 1983. She denies any alcohol or illicit drug use.  - FAMILY: Father had lymphoma  Plan: 1.  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 - Denies any bleeding per rectum or melena. - Reviewed CBC from today which showed hemoglobin 10.2 and hematocrit 31.2.  White count and platelet count normal. 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 - We have discussed initiating her on  Luspatercept if her hemoglobin drops below 10.  She is agreeable. - Recommend follow-up in 3 months with repeat CBC.  Will also check ferritin, iron panel, B12 and folic acid at that time.      Orders Placed This Encounter  Procedures   CBC with Differential/Platelet    Standing Status:   Future    Standing Expiration Date:   08/18/2023    Order Specific Question:   Release to patient    Answer:   Immediate   Lactate dehydrogenase    Standing Status:   Future    Standing Expiration Date:   08/18/2023    Order Specific Question:   Release to patient    Answer:   Immediate   Ferritin    Standing Status:   Future    Standing Expiration Date:   08/18/2023    Order Specific Question:   Release to patient    Answer:   Immediate   Iron and TIBC    Standing Status:   Future    Standing Expiration Date:   08/18/2023    Order Specific Question:   Release to patient    Answer:   Immediate   Vitamin B12    Standing Status:   Future    Standing Expiration Date:   08/18/2023    Order Specific Question:   Release to patient    Answer:   Immediate   Folate    Standing Status:   Future    Standing Expiration Date:   08/18/2023    Order Specific Question:  Release to patient    Answer:   Immediate   Ambulatory referral to Social Work    Referral Priority:   Routine    Referral Type:   Consultation    Referral Reason:   Specialty Services Required    Number of Visits Requested:   1      I,Helena R Teague,acting as a Neurosurgeon for Doreatha Massed, MD.,have documented all relevant documentation on the behalf of Doreatha Massed, MD,as directed by  Doreatha Massed, MD while in the presence of Doreatha Massed, MD.  I, Doreatha Massed MD, have reviewed the above documentation for accuracy and completeness, and I agree with the above.     Doreatha Massed, MD   7/17/20245:04 PM  CHIEF COMPLAINT:   Diagnosis: Myelodysplastic syndrome with ring sideroblasts  Prior  Therapy: none  Current Therapy: Luspatercept  HISTORY OF PRESENT ILLNESS:   Review of labs in EMR shows mild macrocytic anemia since at least 2021 (normal hemoglobin in 2018) with Hgb 11.3 and MCV 102.4. Lab trend from PCP reveals worsening macrocytic anemia over the past 2 years. (06/20/2020) Hgb 11.0/MCV 104, WBC 5.1, platelets 291, nRBC 1% (12/24/2020) Hgb 10.6/MCV 102, WBC 5.7, platelets 296, nRBC 1% (07/08/2021) Hgb 10.6/MCV 104, WBC 6.1, platelets 298, nRBC 2% (01/05/2022) Hgb 9.9/MCV 105, WBC 5.2, platelets 275, nRBC 1% Additional labs from 01/05/2022 show mildly elevated TSH 4.68 with normal T41.31. Nutritional panel (01/14/2022) showed normal B12 559 and normal folic acid 6.7; iron saturation 61% with elevated serum iron 157.  Kidney function appears normal with creatinine 0.88 and GFR 67.  INTERVAL HISTORY:   Kelly Cook is a 82 y.o. female presenting to clinic today for follow up of macrocytic anemia. She was last seen by me on 05/18/22.  Today, she states that she is doing well overall. Her appetite level is at 100%. Her energy level is at 100%.   She reports her energy levels have dropped slightly, but she rarely get a full night sleep due to her husband having dementia. She denies infections within the last 3 months, fever, night sweats, SOB, unexpected weight loss, fatigue, weakness, and hematochezia. Her skin rash has resolved, though she found out it was bed bugs from her daughter's nursing home. She notes she has continued going on walks.   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   Coccyx feels prominent and deviated to left, will try NSAID, if not better xray   Constipation 06/16/2012   Diabetes mellitus    Gall bladder stones    Hemorrhoids    History of kidney stones    Hypothyroid 06/16/2012   Hypothyroid 06/16/2012   Osteopenia    Osteoporosis    Otitis externa 12/01/2012   Shingles    Urinary  incontinence 07/02/2015    Surgical History: Past Surgical History:  Procedure Laterality Date   ABDOMINAL HYSTERECTOMY     dysfunctional uterine bleeding   APPENDECTOMY     CATARACT EXTRACTION W/PHACO Left 11/14/2017   Procedure: CATARACT EXTRACTION PHACO AND INTRAOCULAR LENS PLACEMENT (IOC);  Surgeon: Gemma Payor, MD;  Location: [facility];  Service: Ophthalmology;  Laterality: Left;  CDE: 9.02   CATARACT EXTRACTION W/PHACO Right 11/28/2017   Procedure: CATARACT EXTRACTION PHACO AND INTRAOCULAR LENS PLACEMENT RIGHT EYE CDE=10.42;  Surgeon: Gemma Payor, MD;  Location: [facility];  Service: Ophthalmology;  Laterality: Right;  right   CHOLECYSTECTOMY N/A 06/20/2019   Procedure: LAPAROSCOPIC CHOLECYSTECTOMY;  Surgeon: Lucretia Roers, MD;  Location: [facility];  Service: General;  Laterality: N/A;   COLONOSCOPY N/A 11/27/2015   Procedure: COLONOSCOPY;  Surgeon: Malissa Hippo, MD;  Location: [facility];  Service: Endoscopy;  Laterality: N/A;  730   EXTRACORPOREAL SHOCK WAVE LITHOTRIPSY Left 11/01/2016   Procedure: LEFT EXTRACORPOREAL SHOCK WAVE LITHOTRIPSY (ESWL);  Surgeon: Jerilee Field, MD;  Location: [facility];  Service: Urology;  Laterality: Left;   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     TONSILLECTOMY     adenoids   TUBAL LIGATION      Social History: Social History   Socioeconomic History   Marital status: Married    Spouse name: Not on file   Number of children: Not on file   Years of education: Not on file   Highest education level: Not on file  Occupational History   Not on file  Tobacco Use   Smoking status: Former    Types: Cigarettes   Smokeless tobacco: Never  Vaping Use   Vaping status: Never Used  Substance and Sexual Activity   Alcohol use: No   Drug use: No   Sexual activity: Not Currently    Birth control/protection: Surgical    Comment: hyst  Other Topics Concern   Not on file  Social History Narrative   Not on file   Social  Determinants of Health   Financial Resource Strain: Not on file  Food Insecurity: No Food Insecurity (02/22/2022)   Hunger Vital Sign    Worried About Running Out of Food in the Last Year: Never true    Ran Out of Food in the Last Year: Never true  Transportation Needs: No Transportation Needs (02/22/2022)   PRAPARE - Administrator, Civil Service (Medical): No    Lack of Transportation (Non-Medical): No  Physical Activity: Not on file  Stress: Not on file  Social Connections: Not on file  Intimate Partner Violence: Not At Risk (02/22/2022)   Humiliation, Afraid, Rape, and Kick questionnaire    Fear of Current or Ex-Partner: No    Emotionally Abused: No    Physically Abused: No    Sexually Abused: No    Family History: Family History  Problem Relation Age of Onset   Cancer Father    Dementia Mother    Diabetes Brother    Heart disease Brother    Mental retardation Daughter        in group home    Current Medications:  Current Outpatient Medications:    1st Choice Lancets Ultra Thin MISC, , Disp: , Rfl:    ACCU-CHEK AVIVA PLUS test strip, USE TO TEST BLOOD SUGAR EVERY DAY, Disp: , Rfl:    acetaminophen (TYLENOL) 500 MG tablet, Take 1,000 mg by mouth every 6 (six) hours as needed for moderate pain. , Disp: , Rfl:    ascorbic acid (VITAMIN C) 500 MG tablet, Take 500 mg by mouth at bedtime., Disp: , Rfl:    aspirin EC 81 MG tablet, Take 81 mg by mouth daily. Swallow whole., Disp: , Rfl:    Cholecalciferol (DIALYVITE VITAMIN D 5000) 125 MCG (5000 UT) capsule, Take 5,000 Units by mouth at bedtime., Disp: , Rfl:    hydrOXYzine (ATARAX) 25 MG tablet, Take 25 mg by mouth 3 (three) times daily as needed., Disp: , Rfl:    levothyroxine (SYNTHROID) 100 MCG tablet, Take 100 mcg by mouth daily before breakfast., Disp: , Rfl:    metFORMIN (GLUCOPHAGE-XR) 500 MG  24 hr tablet, Take 500 mg by mouth every evening., Disp: , Rfl:    Allergies: No Known Allergies  REVIEW OF  SYSTEMS:   Review of Systems  Constitutional:  Negative for chills, fatigue and fever.  HENT:   Negative for lump/mass, mouth sores, nosebleeds, sore throat and trouble swallowing.   Eyes:  Negative for eye problems.  Respiratory:  Negative for cough and shortness of breath.   Cardiovascular:  Negative for chest pain, leg swelling and palpitations.  Gastrointestinal:  Negative for abdominal pain, constipation, diarrhea, nausea and vomiting.  Genitourinary:  Negative for bladder incontinence, difficulty urinating, dysuria, frequency, hematuria and nocturia.   Musculoskeletal:  Negative for arthralgias, back pain, flank pain, myalgias and neck pain.  Skin:  Negative for itching and rash.  Neurological:  Negative for dizziness, headaches and numbness.  Hematological:  Does not bruise/bleed easily.  Psychiatric/Behavioral:  Positive for sleep disturbance. Negative for depression and suicidal ideas. The patient is not nervous/anxious.   All other systems reviewed and are negative.    VITALS:   Blood pressure 124/61, pulse 83, temperature 97.9 F (36.6 C), temperature source Tympanic, resp. rate 18, weight 158 lb 14.4 oz (72.1 kg), SpO2 99%.  Wt Readings from Last 3 Encounters:  08/18/22 158 lb 14.4 oz (72.1 kg)  05/18/22 156 lb 12 oz (71.1 kg)  04/21/22 158 lb 9.6 oz (71.9 kg)    Body mass index is 30.02 kg/m.  Performance status (ECOG): 1 - Symptomatic but completely ambulatory  PHYSICAL EXAM:   Physical Exam Vitals and nursing note reviewed. Exam conducted with a chaperone present.  Constitutional:      Appearance: Normal appearance.  Cardiovascular:     Rate and Rhythm: Normal rate and regular rhythm.     Pulses: Normal pulses.     Heart sounds: Normal heart sounds.  Pulmonary:     Effort: Pulmonary effort is normal.     Breath sounds: Normal breath sounds.  Abdominal:     Palpations: Abdomen is soft. There is no hepatomegaly, splenomegaly or mass.     Tenderness: There  is no abdominal tenderness.  Musculoskeletal:     Right lower leg: No edema.     Left lower leg: No edema.  Lymphadenopathy:     Cervical: No cervical adenopathy.     Right cervical: No superficial, deep or posterior cervical adenopathy.    Left cervical: No superficial, deep or posterior cervical adenopathy.     Upper Body:     Right upper body: No supraclavicular or axillary adenopathy.     Left upper body: No supraclavicular or axillary adenopathy.  Neurological:     General: No focal deficit present.     Mental Status: She is alert and oriented to person, place, and time.  Psychiatric:        Mood and Affect: Mood normal.        Behavior: Behavior normal.     LABS:      Latest Ref Rng & Units 08/18/2022    2:34 PM 05/18/2022   11:19 AM 04/15/2022    9:00 AM  CBC  WBC 4.0 - 10.5 K/uL 7.2  10.4  5.1   Hemoglobin 12.0 - 15.0 g/dL 16.1  09.6  04.5   Hematocrit 36.0 - 46.0 % 31.2  32.8  30.9   Platelets 150 - 400 K/uL 293  309  276       Latest Ref Rng & Units 02/22/2022    2:00 PM 06/18/2019  11:30 AM 05/18/2019   12:48 PM  CMP  Glucose 70 - 99 mg/dL 962  952  841   BUN 8 - 23 mg/dL 18  19  17    Creatinine 0.44 - 1.00 mg/dL 3.24  4.01  0.27   Sodium 135 - 145 mmol/L 138  138  140   Potassium 3.5 - 5.1 mmol/L 3.6  3.8  4.4   Chloride 98 - 111 mmol/L 105  108  108   CO2 22 - 32 mmol/L 24  21  22    Calcium 8.9 - 10.3 mg/dL 9.2  8.7  9.1   Total Protein 6.5 - 8.1 g/dL 7.9     Total Bilirubin 0.3 - 1.2 mg/dL 0.9     Alkaline Phos 38 - 126 U/L 38     AST 15 - 41 U/L 18     ALT 0 - 44 U/L 21        No results found for: "CEA1", "CEA" / No results found for: "CEA1", "CEA" No results found for: "PSA1" No results found for: "CAN199" No results found for: "CAN125"  Lab Results  Component Value Date   TOTALPROTELP 7.3 02/22/2022   TOTALPROTELP 7.2 02/22/2022   ALBUMINELP 4.0 02/22/2022   A1GS 0.2 02/22/2022   A2GS 0.5 02/22/2022   BETS 1.1 02/22/2022   GAMS 1.4  02/22/2022   MSPIKE Not Observed 02/22/2022   SPEI Comment 02/22/2022   Lab Results  Component Value Date   FERRITIN 298 02/22/2022   Lab Results  Component Value Date   LDH 117 02/22/2022   Bone Marrow Biopsy 04/15/22:  DIAGNOSIS:   BONE MARROW, ASPIRATE, CLOT, CORE:  -Hypercellular bone marrow with dyspoietic changes  -Slight polyclonal plasmacytosis  -See comment   PERIPHERAL BLOOD:  -Macrocytic anemia  -Mild neutrophilic left shift   COMMENT:  The bone marrow is hypercellular for age with dyspoietic changes associated with abundant ring sideroblasts.  No increase in blastic cells identified.  The findings are very concerning for involvement by a low-grade myelodysplastic syndrome with ring sideroblasts.  The  differential diagnosis includes secondary changes related to lead toxicity, pyridoxine deficiency, alcohol, medication, etc.  Correlation with cytogenetic and FISH studies is recommended.   STUDIES:   No results found.

## 2022-08-18 ENCOUNTER — Encounter: Payer: Self-pay | Admitting: Hematology

## 2022-08-18 ENCOUNTER — Inpatient Hospital Stay: Payer: Medicare PPO | Attending: Hematology

## 2022-08-18 ENCOUNTER — Inpatient Hospital Stay: Payer: Medicare PPO | Admitting: Hematology

## 2022-08-18 VITALS — BP 124/61 | HR 83 | Temp 97.9°F | Resp 18 | Wt 158.9 lb

## 2022-08-18 DIAGNOSIS — D469 Myelodysplastic syndrome, unspecified: Secondary | ICD-10-CM

## 2022-08-18 DIAGNOSIS — D461 Refractory anemia with ring sideroblasts: Secondary | ICD-10-CM | POA: Insufficient documentation

## 2022-08-18 DIAGNOSIS — E119 Type 2 diabetes mellitus without complications: Secondary | ICD-10-CM | POA: Diagnosis not present

## 2022-08-18 DIAGNOSIS — Z79899 Other long term (current) drug therapy: Secondary | ICD-10-CM | POA: Diagnosis not present

## 2022-08-18 DIAGNOSIS — D890 Polyclonal hypergammaglobulinemia: Secondary | ICD-10-CM | POA: Insufficient documentation

## 2022-08-18 DIAGNOSIS — E039 Hypothyroidism, unspecified: Secondary | ICD-10-CM | POA: Diagnosis not present

## 2022-08-18 DIAGNOSIS — Z7984 Long term (current) use of oral hypoglycemic drugs: Secondary | ICD-10-CM | POA: Diagnosis not present

## 2022-08-18 DIAGNOSIS — E785 Hyperlipidemia, unspecified: Secondary | ICD-10-CM | POA: Insufficient documentation

## 2022-08-18 DIAGNOSIS — Z7982 Long term (current) use of aspirin: Secondary | ICD-10-CM | POA: Insufficient documentation

## 2022-08-18 DIAGNOSIS — Z90710 Acquired absence of both cervix and uterus: Secondary | ICD-10-CM | POA: Insufficient documentation

## 2022-08-18 DIAGNOSIS — Z87891 Personal history of nicotine dependence: Secondary | ICD-10-CM | POA: Insufficient documentation

## 2022-08-18 DIAGNOSIS — D539 Nutritional anemia, unspecified: Secondary | ICD-10-CM

## 2022-08-18 LAB — CBC WITH DIFFERENTIAL/PLATELET
Abs Immature Granulocytes: 0.05 10*3/uL (ref 0.00–0.07)
Basophils Absolute: 0.1 10*3/uL (ref 0.0–0.1)
Basophils Relative: 1 %
Eosinophils Absolute: 0.3 10*3/uL (ref 0.0–0.5)
Eosinophils Relative: 4 %
HCT: 31.2 % — ABNORMAL LOW (ref 36.0–46.0)
Hemoglobin: 10.2 g/dL — ABNORMAL LOW (ref 12.0–15.0)
Immature Granulocytes: 1 %
Lymphocytes Relative: 38 %
Lymphs Abs: 2.7 10*3/uL (ref 0.7–4.0)
MCH: 34.8 pg — ABNORMAL HIGH (ref 26.0–34.0)
MCHC: 32.7 g/dL (ref 30.0–36.0)
MCV: 106.5 fL — ABNORMAL HIGH (ref 80.0–100.0)
Monocytes Absolute: 0.7 10*3/uL (ref 0.1–1.0)
Monocytes Relative: 10 %
Neutro Abs: 3.3 10*3/uL (ref 1.7–7.7)
Neutrophils Relative %: 46 %
Platelets: 293 10*3/uL (ref 150–400)
RBC: 2.93 MIL/uL — ABNORMAL LOW (ref 3.87–5.11)
RDW: 16.7 % — ABNORMAL HIGH (ref 11.5–15.5)
WBC: 7.2 10*3/uL (ref 4.0–10.5)
nRBC: 1.1 % — ABNORMAL HIGH (ref 0.0–0.2)

## 2022-08-18 NOTE — Patient Instructions (Signed)
[facility]  Discharge Instructions  You were seen and examined today by Dr. Ellin Saba.  Dr. Ellin Saba discussed your most recent lab work which revealed that everything looks stable.  Dr. Ellin Saba will recheck your labs before you next appointment.  Follow-up as scheduled in 3 months.    Thank you for choosing [facility] to provide your oncology and hematology care.   To afford each patient quality time with our provider, please arrive at least 15 minutes before your scheduled appointment time. You may need to reschedule your appointment if you arrive late (10 or more minutes). Arriving late affects you and other patients whose appointments are after yours.  Also, if you miss three or more appointments without notifying the office, you may be dismissed from the clinic at the provider's discretion.    Again, thank you for choosing [facility].  Our hope is that these requests will decrease the amount of time that you wait before being seen by our physicians.   If you have a lab appointment with the Cancer Center - please note that after April 8th, all labs will be drawn in the cancer center.  You do not have to check in or register with the main entrance as you have in the past but will complete your check-in at the cancer center.            _____________________________________________________________  Should you have questions after your visit to [facility], please contact our office at [phone] and follow the prompts.  Our office hours are 8:00 a.m. to 4:30 p.m. Monday - Thursday and 8:00 a.m. to 2:30 p.m. Friday.  Please note that voicemails left after 4:00 p.m. may not be returned until the following business day.  We are closed weekends and all major holidays.  You do have access to a nurse 24-7, just call the main number to the clinic [phone] and do not press any options, hold on the line  and a nurse will answer the phone.    For prescription refill requests, have your pharmacy contact our office and allow 72 hours.    Masks are no longer required in the cancer centers. If you would like for your care team to wear a mask while they are taking care of you, please let them know. You may have one support person who is at least 81 years old accompany you for your appointments.

## 2022-08-19 ENCOUNTER — Inpatient Hospital Stay: Payer: Medicare PPO | Admitting: Licensed Clinical Social Worker

## 2022-08-26 ENCOUNTER — Inpatient Hospital Stay: Payer: Medicare PPO | Admitting: Licensed Clinical Social Worker

## 2022-08-26 DIAGNOSIS — D469 Myelodysplastic syndrome, unspecified: Secondary | ICD-10-CM

## 2022-08-26 NOTE — Progress Notes (Signed)
[facility] CSW Progress Note  Clinical Child psychotherapist contacted patient by phone to follow up on inquiry regarding advanced directives.  CSW informed pt advanced directives can be completed, notarized and witnessed at the cancer center w/ a copy scanned to pt's chart.  Pt requesting to come in on 8/1 to complete document.  Appointment booked w/ CSW on 8/1 at 9:00am.    Rachel Moulds, LCSW Clinical Social Worker [facility]

## 2022-09-02 ENCOUNTER — Inpatient Hospital Stay: Payer: Medicare PPO | Attending: Hematology | Admitting: Licensed Clinical Social Worker

## 2022-09-02 DIAGNOSIS — D469 Myelodysplastic syndrome, unspecified: Secondary | ICD-10-CM

## 2022-09-02 NOTE — Progress Notes (Signed)
[facility] Healthcare Advance Directives Clinical Social Work  Patient presented to Advance Directives Clinic  to review and complete healthcare advance directives.  Clinical Social Worker met with patient.  The patient designated Blanchie Serve [phone]) as their primary healthcare agent and Herbert Deaner as their secondary agent.  Patient also completed healthcare living will.    Documents were notarized and copies made for patient/family. Clinical Social Worker will send documents to medical records to be scanned into patient's chart. Clinical Social Worker encouraged patient/family to contact with any additional questions or concerns.   Rachel Moulds, LCSW Clinical Social Worker [facility]

## 2022-09-06 ENCOUNTER — Ambulatory Visit (HOSPITAL_COMMUNITY)
Admission: RE | Admit: 2022-09-06 | Discharge: 2022-09-06 | Disposition: A | Discharge status: Home / Self Care | Payer: Medicare PPO | Source: Ambulatory Visit | Attending: Internal Medicine | Admitting: Internal Medicine

## 2022-09-06 DIAGNOSIS — Z1231 Encounter for screening mammogram for malignant neoplasm of breast: Secondary | ICD-10-CM | POA: Insufficient documentation

## 2022-11-19 ENCOUNTER — Inpatient Hospital Stay: Payer: Medicare PPO | Attending: Hematology

## 2022-11-19 DIAGNOSIS — R051 Acute cough: Secondary | ICD-10-CM | POA: Diagnosis not present

## 2022-11-19 DIAGNOSIS — E039 Hypothyroidism, unspecified: Secondary | ICD-10-CM | POA: Insufficient documentation

## 2022-11-19 DIAGNOSIS — E669 Obesity, unspecified: Secondary | ICD-10-CM | POA: Diagnosis not present

## 2022-11-19 DIAGNOSIS — Z7984 Long term (current) use of oral hypoglycemic drugs: Secondary | ICD-10-CM | POA: Diagnosis not present

## 2022-11-19 DIAGNOSIS — E119 Type 2 diabetes mellitus without complications: Secondary | ICD-10-CM | POA: Insufficient documentation

## 2022-11-19 DIAGNOSIS — Z7982 Long term (current) use of aspirin: Secondary | ICD-10-CM | POA: Diagnosis not present

## 2022-11-19 DIAGNOSIS — E785 Hyperlipidemia, unspecified: Secondary | ICD-10-CM | POA: Insufficient documentation

## 2022-11-19 DIAGNOSIS — D469 Myelodysplastic syndrome, unspecified: Secondary | ICD-10-CM

## 2022-11-19 DIAGNOSIS — D890 Polyclonal hypergammaglobulinemia: Secondary | ICD-10-CM | POA: Insufficient documentation

## 2022-11-19 DIAGNOSIS — Z87891 Personal history of nicotine dependence: Secondary | ICD-10-CM | POA: Diagnosis not present

## 2022-11-19 DIAGNOSIS — D461 Refractory anemia with ring sideroblasts: Secondary | ICD-10-CM | POA: Diagnosis not present

## 2022-11-19 LAB — CBC WITH DIFFERENTIAL/PLATELET
Abs Immature Granulocytes: 0.06 10*3/uL (ref 0.00–0.07)
Basophils Absolute: 0.1 10*3/uL (ref 0.0–0.1)
Basophils Relative: 1 %
Eosinophils Absolute: 0.3 10*3/uL (ref 0.0–0.5)
Eosinophils Relative: 4 %
HCT: 32.2 % — ABNORMAL LOW (ref 36.0–46.0)
Hemoglobin: 10.4 g/dL — ABNORMAL LOW (ref 12.0–15.0)
Immature Granulocytes: 1 %
Lymphocytes Relative: 30 %
Lymphs Abs: 2.1 10*3/uL (ref 0.7–4.0)
MCH: 34.4 pg — ABNORMAL HIGH (ref 26.0–34.0)
MCHC: 32.3 g/dL (ref 30.0–36.0)
MCV: 106.6 fL — ABNORMAL HIGH (ref 80.0–100.0)
Monocytes Absolute: 0.6 10*3/uL (ref 0.1–1.0)
Monocytes Relative: 9 %
Neutro Abs: 3.7 10*3/uL (ref 1.7–7.7)
Neutrophils Relative %: 55 %
Platelets: 293 10*3/uL (ref 150–400)
RBC: 3.02 MIL/uL — ABNORMAL LOW (ref 3.87–5.11)
RDW: 16.2 % — ABNORMAL HIGH (ref 11.5–15.5)
WBC: 6.8 10*3/uL (ref 4.0–10.5)
nRBC: 1.3 % — ABNORMAL HIGH (ref 0.0–0.2)

## 2022-11-19 LAB — IRON AND TIBC
Iron: 101 ug/dL (ref 28–170)
Saturation Ratios: 31 % (ref 10.4–31.8)
TIBC: 327 ug/dL (ref 250–450)
UIBC: 226 ug/dL

## 2022-11-19 LAB — FERRITIN: Ferritin: 378 ng/mL — ABNORMAL HIGH (ref 11–307)

## 2022-11-19 LAB — VITAMIN B12: Vitamin B-12: 472 pg/mL (ref 180–914)

## 2022-11-19 LAB — LACTATE DEHYDROGENASE: LDH: 118 U/L (ref 98–192)

## 2022-11-19 LAB — FOLATE: Folate: 22.7 ng/mL (ref >5.9)

## 2022-11-25 ENCOUNTER — Ambulatory Visit (HOSPITAL_COMMUNITY)
Admission: RE | Admit: 2022-11-25 | Discharge: 2022-11-25 | Disposition: A | Discharge status: Home / Self Care | Payer: Medicare PPO | Source: Ambulatory Visit | Attending: Oncology | Admitting: Oncology

## 2022-11-25 ENCOUNTER — Inpatient Hospital Stay: Payer: Medicare PPO | Admitting: Oncology

## 2022-11-25 ENCOUNTER — Telehealth: Payer: Self-pay | Admitting: *Deleted

## 2022-11-25 VITALS — BP 127/60 | HR 86 | Temp 98.1°F | Resp 17 | Wt 153.5 lb

## 2022-11-25 DIAGNOSIS — Z7984 Long term (current) use of oral hypoglycemic drugs: Secondary | ICD-10-CM | POA: Diagnosis not present

## 2022-11-25 DIAGNOSIS — R051 Acute cough: Secondary | ICD-10-CM

## 2022-11-25 DIAGNOSIS — Z7982 Long term (current) use of aspirin: Secondary | ICD-10-CM | POA: Diagnosis not present

## 2022-11-25 DIAGNOSIS — D890 Polyclonal hypergammaglobulinemia: Secondary | ICD-10-CM | POA: Diagnosis not present

## 2022-11-25 DIAGNOSIS — D461 Refractory anemia with ring sideroblasts: Secondary | ICD-10-CM | POA: Diagnosis not present

## 2022-11-25 DIAGNOSIS — R0989 Other specified symptoms and signs involving the circulatory and respiratory systems: Secondary | ICD-10-CM | POA: Diagnosis not present

## 2022-11-25 DIAGNOSIS — R059 Cough, unspecified: Secondary | ICD-10-CM | POA: Diagnosis not present

## 2022-11-25 DIAGNOSIS — D469 Myelodysplastic syndrome, unspecified: Secondary | ICD-10-CM | POA: Diagnosis not present

## 2022-11-25 DIAGNOSIS — E119 Type 2 diabetes mellitus without complications: Secondary | ICD-10-CM | POA: Diagnosis not present

## 2022-11-25 DIAGNOSIS — E039 Hypothyroidism, unspecified: Secondary | ICD-10-CM | POA: Diagnosis not present

## 2022-11-25 DIAGNOSIS — E669 Obesity, unspecified: Secondary | ICD-10-CM | POA: Diagnosis not present

## 2022-11-25 DIAGNOSIS — E785 Hyperlipidemia, unspecified: Secondary | ICD-10-CM | POA: Diagnosis not present

## 2022-11-25 MED ORDER — METHYLPREDNISOLONE 4 MG PO TBPK: ORAL_TABLET | ORAL | 0 refills | Status: DC

## 2022-11-25 NOTE — Telephone Encounter (Signed)
Per Durenda Hurt, NP there is no evidence of an infection so she patient does not require antibiotics. Recommend she try the oral steroids that were called in for her to see if that helps with her cough.  Patient made aware and verbalized understanding.

## 2022-11-25 NOTE — Progress Notes (Signed)
[facility] [address], [place] [address]    Clinic Day:  11/25/2022  Referring physician: Benita Stabile, MD  Patient Care Team: Kelly Stabile, MD as PCP - General (Internal Medicine) Kelly Massed, MD as Consulting Physician (Hematology)   ASSESSMENT & PLAN:   Assessment: 1.  MDS with ring sideroblasts: - Seen at the request of Kelly Cook for macrocytic anemia. - Nutritional deficiency workup was negative.  Myeloma workup was negative.  Kappa light chains are mildly elevated at 45 with ratio of 1.78. - US abdomen (03/26/2022): Spleen is mildly enlarged measuring 11.8 cm in length and volume of 444 cc. - Serum EPO: 57.7 - BMBX (04/15/2022): Hypercellular for age with dyspoietic changes associated with ring sideroblasts, more than 15% of erythroid precursors.  No increase in blasts noted.  No monoclonal B-cell population.  No increase in plasma cells. - NGS: SF3B1 - MDS FISH: Normal.  Cytogenetics: 46, XX[20]   2.  Other history - PMH: Hypothyroidism, diabetes, hyperlipidemia, obesity, carotid artery stenosis, osteoporosis  - SOCIAL: She lives at home alone and is fully independent. She is retired from work as Librarian, academic of a Development worker, international aid. She ambulates without assistive device, keeps up with her ADLs, and stays active by taking daily walks. She has a daughter who is mentally handicapped and lives in a group home. She is a former smoker (smoked about 1 PPD x 30 years), quit in 1983. She denies any alcohol or illicit drug use.  - FAMILY: Father had lymphoma  Plan: 1.  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 - Denies any bleeding per rectum or melena. -- Repeat CBC from 11/19/22 showed hemoglobin 10.4 (10.2 with hematocrit 32.2 (31.2) white count and platelets are normal. -Ferritin, folate and iron panel all WNL.  Vitamin B12 472. 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 - We have discussed initiating her on Luspatercept if her hemoglobin drops below 10.  She is agreeable. -Recommend continued surveillance every 3 months.  She knows to call with changes or concerns.  2. Cough: -Recommend chest x-ray and will call with results. -Exam did not reveal any signs of pneumonia or bronchitis but did reveal some decreased breath sounds bilaterally and prolonged expiration.  We discussed trial of Medrol Dosepak to help with inflammation.  She was agreeable and has tolerated steroids well in the past.  We discussed taking in the morning to avoid sleep disruption. -Will call with results of chest x-ray and if antibiotics need to be added.  PLAN SUMMARY: >> Chest x-ray today.  Will call with results. >> Return to clinic in 3 months with labs a few days before.     Orders Placed This Encounter  Procedures   DG Chest 2 View    Standing Status:   Future    Number of Occurrences:   1    Standing Expiration Date:   11/25/2023    Order Specific Question:   Reason for Exam (SYMPTOM  OR DIAGNOSIS REQUIRED)    Answer:   cough    Order Specific Question:   Preferred imaging location?    Answer:   [facility]   I spent 30 minutes dedicated to the care of this patient (face-to-face and non-face-to-face) on the date of the encounter to include what is described in the assessment and plan.   Kelly Kaufmann, Cook   10/24/202412:34 PM  CHIEF COMPLAINT:   Diagnosis: Myelodysplastic syndrome with ring sideroblasts  Prior Therapy: none  Current Therapy: Luspatercept  HISTORY OF PRESENT ILLNESS:  Review of labs in EMR shows mild macrocytic anemia since at least 2021 (normal hemoglobin in 2018) with Hgb 11.3 and MCV 102.4. Lab trend from PCP reveals worsening macrocytic anemia over the past 2 years. (06/20/2020) Hgb 11.0/MCV 104, WBC 5.1, platelets 291, nRBC 1% (12/24/2020) Hgb 10.6/MCV 102, WBC 5.7, platelets 296, nRBC 1% (07/08/2021) Hgb  10.6/MCV 104, WBC 6.1, platelets 298, nRBC 2% (01/05/2022) Hgb 9.9/MCV 105, WBC 5.2, platelets 275, nRBC 1% Additional labs from 01/05/2022 show mildly elevated TSH 4.68 with normal T41.31. Nutritional panel (01/14/2022) showed normal B12 559 and normal folic acid 6.7; iron saturation 61% with elevated serum iron 157.  Kidney function appears normal with creatinine 0.88 and GFR 67  INTERVAL HISTORY:   Kelly Cook is a 82 y.o. female presenting to clinic today for follow up of macrocytic anemia. She was last seen by Kelly Cook on 08/19/2022.   Today, she states that she is doing well overall. Her appetite level is at 100%. Her energy level is at 90%.   Her husband passed away about 1 month ago. Has had to adjust to her new normal.  Thinks she may have picked up an URI while at the funeral. Has had some coughing and sputum production. Chest hurts from coughing so much. Cough is worse in the morning and at night time. Has been using robitussin, mucinex liquid and has been taking mucinex DM most recently. No fevers, Having trouble sleeping.   She denies infections within the last 3 months, fever, night sweats, SOB, unexpected weight loss, fatigue, weakness, and hematochezia.  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   Coccyx feels prominent and deviated to left, will try NSAID, if not better xray   Constipation 06/16/2012   Diabetes mellitus    Gall bladder stones    Hemorrhoids    History of kidney stones    Hypothyroid 06/16/2012   Hypothyroid 06/16/2012   Osteopenia    Osteoporosis    Otitis externa 12/01/2012   Shingles    Urinary incontinence 07/02/2015    Surgical History: Past Surgical History:  Procedure Laterality Date   ABDOMINAL HYSTERECTOMY     dysfunctional uterine bleeding   APPENDECTOMY     CATARACT EXTRACTION W/PHACO Left 11/14/2017   Procedure: CATARACT EXTRACTION PHACO AND INTRAOCULAR LENS  PLACEMENT (IOC);  Surgeon: Gemma Payor, MD;  Location: [facility];  Service: Ophthalmology;  Laterality: Left;  CDE: 9.02   CATARACT EXTRACTION W/PHACO Right 11/28/2017   Procedure: CATARACT EXTRACTION PHACO AND INTRAOCULAR LENS PLACEMENT RIGHT EYE CDE=10.42;  Surgeon: Gemma Payor, MD;  Location: [facility];  Service: Ophthalmology;  Laterality: Right;  right   CHOLECYSTECTOMY N/A 06/20/2019   Procedure: LAPAROSCOPIC CHOLECYSTECTOMY;  Surgeon: Lucretia Roers, MD;  Location: [facility];  Service: General;  Laterality: N/A;   COLONOSCOPY N/A 11/27/2015   Procedure: COLONOSCOPY;  Surgeon: Malissa Hippo, MD;  Location: [facility];  Service: Endoscopy;  Laterality: N/A;  730   EXTRACORPOREAL SHOCK WAVE LITHOTRIPSY Left 11/01/2016   Procedure: LEFT EXTRACORPOREAL SHOCK WAVE LITHOTRIPSY (ESWL);  Surgeon: Jerilee Field, MD;  Location: [facility];  Service: Urology;  Laterality: Left;   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     TONSILLECTOMY     adenoids   TUBAL LIGATION  Social History: Social History   Socioeconomic History   Marital status: Married    Spouse name: Not on file   Number of children: Not on file   Years of education: Not on file   Highest education level: Not on file  Occupational History   Not on file  Tobacco Use   Smoking status: Former    Types: Cigarettes   Smokeless tobacco: Never  Vaping Use   Vaping status: Never Used  Substance and Sexual Activity   Alcohol use: No   Drug use: No   Sexual activity: Not Currently    Birth control/protection: Surgical    Comment: hyst  Other Topics Concern   Not on file  Social History Narrative   Not on file   Social Determinants of Health   Financial Resource Strain: Not on file  Food Insecurity: No Food Insecurity (02/22/2022)   Hunger Vital Sign    Worried About Running Out of Food in the Last Year: Never true    Ran Out of Food in the Last Year: Never true  Transportation Needs: No  Transportation Needs (02/22/2022)   PRAPARE - Administrator, Civil Service (Medical): No    Lack of Transportation (Non-Medical): No  Physical Activity: Not on file  Stress: Not on file  Social Connections: Not on file  Intimate Partner Violence: Not At Risk (02/22/2022)   Humiliation, Afraid, Rape, and Kick questionnaire    Fear of Current or Ex-Partner: No    Emotionally Abused: No    Physically Abused: No    Sexually Abused: No    Family History: Family History  Problem Relation Age of Onset   Cancer Father    Dementia Mother    Diabetes Brother    Heart disease Brother    Mental retardation Daughter        in group home    Current Medications:  Current Outpatient Medications:    1st Choice Lancets Ultra Thin MISC, , Disp: , Rfl:    ACCU-CHEK AVIVA PLUS test strip, USE TO TEST BLOOD SUGAR EVERY DAY, Disp: , Rfl:    acetaminophen (TYLENOL) 500 MG tablet, Take 1,000 mg by mouth every 6 (six) hours as needed for moderate pain. , Disp: , Rfl:    ascorbic acid (VITAMIN C) 500 MG tablet, Take 500 mg by mouth at bedtime., Disp: , Rfl:    aspirin EC 81 MG tablet, Take 81 mg by mouth daily. Swallow whole., Disp: , Rfl:    Cholecalciferol (DIALYVITE VITAMIN D 5000) 125 MCG (5000 UT) capsule, Take 5,000 Units by mouth at bedtime., Disp: , Rfl:    hydrOXYzine (ATARAX) 25 MG tablet, Take 25 mg by mouth 3 (three) times daily as needed., Disp: , Rfl:    levothyroxine (SYNTHROID) 100 MCG tablet, Take 100 mcg by mouth daily before breakfast., Disp: , Rfl:    metFORMIN (GLUCOPHAGE-XR) 500 MG 24 hr tablet, Take 500 mg by mouth every evening., Disp: , Rfl:    methylPREDNISolone (MEDROL DOSEPAK) 4 MG TBPK tablet, Take as directed., Disp: 21 tablet, Rfl: 0   Allergies: No Known Allergies  REVIEW OF SYSTEMS:   Review of Systems  Constitutional:  Positive for fatigue.  Respiratory:  Positive for cough.   Gastrointestinal:  Positive for diarrhea.  Psychiatric/Behavioral:   Positive for depression and sleep disturbance.      VITALS:   Blood pressure 127/60, pulse 86, temperature 98.1 F (36.7 C), temperature source Oral, resp. rate 17, weight 153 lb  8 oz (69.6 kg), SpO2 97%.  Wt Readings from Last 3 Encounters:  11/25/22 153 lb 8 oz (69.6 kg)  08/18/22 158 lb 14.4 oz (72.1 kg)  05/18/22 156 lb 12 oz (71.1 kg)    Body mass index is 29 kg/m.  Performance status (ECOG): 1 - Symptomatic but completely ambulatory  PHYSICAL EXAM:   Physical Exam Constitutional:      Appearance: Normal appearance.  Cardiovascular:     Rate and Rhythm: Normal rate and regular rhythm.  Pulmonary:     Effort: Pulmonary effort is normal.     Breath sounds: Normal breath sounds.  Abdominal:     General: Bowel sounds are normal.     Palpations: Abdomen is soft.  Musculoskeletal:        General: No swelling. Normal range of motion.  Neurological:     Mental Status: She is alert and oriented to person, place, and time. Mental status is at baseline.     LABS:      Latest Ref Rng & Units 11/19/2022    9:54 AM 08/18/2022    2:34 PM 05/18/2022   11:19 AM  CBC  WBC 4.0 - 10.5 K/uL 6.8  7.2  10.4   Hemoglobin 12.0 - 15.0 g/dL 86.5  78.4  69.6   Hematocrit 36.0 - 46.0 % 32.2  31.2  32.8   Platelets 150 - 400 K/uL 293  293  309       Latest Ref Rng & Units 02/22/2022    2:00 PM 06/18/2019   11:30 AM 05/18/2019   12:48 PM  CMP  Glucose 70 - 99 mg/dL 295  284  132   BUN 8 - 23 mg/dL 18  19  17    Creatinine 0.44 - 1.00 mg/dL 4.40  1.02  7.25   Sodium 135 - 145 mmol/L 138  138  140   Potassium 3.5 - 5.1 mmol/L 3.6  3.8  4.4   Chloride 98 - 111 mmol/L 105  108  108   CO2 22 - 32 mmol/L 24  21  22    Calcium 8.9 - 10.3 mg/dL 9.2  8.7  9.1   Total Protein 6.5 - 8.1 g/dL 7.9     Total Bilirubin 0.3 - 1.2 mg/dL 0.9     Alkaline Phos 38 - 126 U/L 38     AST 15 - 41 U/L 18     ALT 0 - 44 U/L 21        No results found for: "CEA1", "CEA" / No results found for: "CEA1",  "CEA" No results found for: "PSA1" No results found for: "CAN199" No results found for: "CAN125"  Lab Results  Component Value Date   TOTALPROTELP 7.3 02/22/2022   TOTALPROTELP 7.2 02/22/2022   ALBUMINELP 4.0 02/22/2022   A1GS 0.2 02/22/2022   A2GS 0.5 02/22/2022   BETS 1.1 02/22/2022   GAMS 1.4 02/22/2022   MSPIKE Not Observed 02/22/2022   SPEI Comment 02/22/2022   Lab Results  Component Value Date   TIBC 327 11/19/2022   FERRITIN 378 (H) 11/19/2022   FERRITIN 298 02/22/2022   IRONPCTSAT 31 11/19/2022   Lab Results  Component Value Date   LDH 118 11/19/2022   LDH 117 02/22/2022   Bone Marrow Biopsy 04/15/22:  DIAGNOSIS:   BONE MARROW, ASPIRATE, CLOT, CORE:  -Hypercellular bone marrow with dyspoietic changes  -Slight polyclonal plasmacytosis  -See comment   PERIPHERAL BLOOD:  -Macrocytic anemia  -Mild neutrophilic left shift   COMMENT:  The bone marrow is hypercellular for age with dyspoietic changes associated with abundant ring sideroblasts.  No increase in blastic cells identified.  The findings are very concerning for involvement by a low-grade myelodysplastic syndrome with ring sideroblasts.  The  differential diagnosis includes secondary changes related to lead toxicity, pyridoxine deficiency, alcohol, medication, etc.  Correlation with cytogenetic and FISH studies is recommended.   STUDIES:   DG Chest 2 View  Result Date: 11/25/2022 CLINICAL DATA:  Cough and congestion for 3 weeks.  Rib soreness. EXAM: CHEST - 2 VIEW COMPARISON:  Chest radiograph 05/18/2019 FINDINGS: The cardiomediastinal silhouette is normal There is no focal consolidation or pulmonary edema. There is no pleural effusion or pneumothorax There is no acute osseous abnormality. No displaced rib fracture is identified. Cholecystectomy clips are noted. IMPRESSION: No radiographic evidence of acute cardiopulmonary process. Electronically Signed   By: Lesia Hausen M.D.   On: 11/25/2022 11:04

## 2022-12-07 DIAGNOSIS — Z23 Encounter for immunization: Secondary | ICD-10-CM | POA: Diagnosis not present

## 2023-01-13 DIAGNOSIS — E039 Hypothyroidism, unspecified: Secondary | ICD-10-CM | POA: Diagnosis not present

## 2023-01-13 DIAGNOSIS — E119 Type 2 diabetes mellitus without complications: Secondary | ICD-10-CM | POA: Diagnosis not present

## 2023-01-13 DIAGNOSIS — E782 Mixed hyperlipidemia: Secondary | ICD-10-CM | POA: Diagnosis not present

## 2023-01-18 ENCOUNTER — Other Ambulatory Visit (HOSPITAL_COMMUNITY): Payer: Self-pay | Admitting: Family Medicine

## 2023-01-18 DIAGNOSIS — R809 Proteinuria, unspecified: Secondary | ICD-10-CM | POA: Diagnosis not present

## 2023-01-18 DIAGNOSIS — E1122 Type 2 diabetes mellitus with diabetic chronic kidney disease: Secondary | ICD-10-CM | POA: Diagnosis not present

## 2023-01-18 DIAGNOSIS — D649 Anemia, unspecified: Secondary | ICD-10-CM | POA: Diagnosis not present

## 2023-01-18 DIAGNOSIS — E039 Hypothyroidism, unspecified: Secondary | ICD-10-CM | POA: Diagnosis not present

## 2023-01-18 DIAGNOSIS — I6529 Occlusion and stenosis of unspecified carotid artery: Secondary | ICD-10-CM

## 2023-01-18 DIAGNOSIS — R790 Abnormal level of blood mineral: Secondary | ICD-10-CM | POA: Diagnosis not present

## 2023-01-18 DIAGNOSIS — M8580 Other specified disorders of bone density and structure, unspecified site: Secondary | ICD-10-CM | POA: Diagnosis not present

## 2023-01-18 DIAGNOSIS — Z0000 Encounter for general adult medical examination without abnormal findings: Secondary | ICD-10-CM | POA: Diagnosis not present

## 2023-01-18 DIAGNOSIS — E669 Obesity, unspecified: Secondary | ICD-10-CM | POA: Diagnosis not present

## 2023-01-20 DIAGNOSIS — E119 Type 2 diabetes mellitus without complications: Secondary | ICD-10-CM | POA: Diagnosis not present

## 2023-02-03 ENCOUNTER — Ambulatory Visit (HOSPITAL_COMMUNITY)
Admission: RE | Admit: 2023-02-03 | Discharge: 2023-02-03 | Disposition: A | Discharge status: Home / Self Care | Payer: Medicare PPO | Source: Ambulatory Visit | Attending: Family Medicine | Admitting: Family Medicine

## 2023-02-03 DIAGNOSIS — I6523 Occlusion and stenosis of bilateral carotid arteries: Secondary | ICD-10-CM | POA: Diagnosis not present

## 2023-02-03 DIAGNOSIS — I6529 Occlusion and stenosis of unspecified carotid artery: Secondary | ICD-10-CM | POA: Diagnosis not present

## 2023-02-24 ENCOUNTER — Other Ambulatory Visit: Payer: Self-pay

## 2023-02-24 DIAGNOSIS — D469 Myelodysplastic syndrome, unspecified: Secondary | ICD-10-CM

## 2023-02-24 DIAGNOSIS — D539 Nutritional anemia, unspecified: Secondary | ICD-10-CM

## 2023-02-25 ENCOUNTER — Inpatient Hospital Stay: Payer: Medicare PPO | Attending: Hematology

## 2023-02-25 DIAGNOSIS — D890 Polyclonal hypergammaglobulinemia: Secondary | ICD-10-CM | POA: Diagnosis not present

## 2023-02-25 DIAGNOSIS — D539 Nutritional anemia, unspecified: Secondary | ICD-10-CM

## 2023-02-25 DIAGNOSIS — D469 Myelodysplastic syndrome, unspecified: Secondary | ICD-10-CM

## 2023-02-25 DIAGNOSIS — D461 Refractory anemia with ring sideroblasts: Secondary | ICD-10-CM | POA: Diagnosis not present

## 2023-02-25 LAB — CBC WITH DIFFERENTIAL/PLATELET
Abs Immature Granulocytes: 0.02 10*3/uL (ref 0.00–0.07)
Basophils Absolute: 0.1 10*3/uL (ref 0.0–0.1)
Basophils Relative: 1 %
Eosinophils Absolute: 0.3 10*3/uL (ref 0.0–0.5)
Eosinophils Relative: 4 %
HCT: 31.6 % — ABNORMAL LOW (ref 36.0–46.0)
Hemoglobin: 10.4 g/dL — ABNORMAL LOW (ref 12.0–15.0)
Immature Granulocytes: 0 %
Lymphocytes Relative: 36 %
Lymphs Abs: 2.3 10*3/uL (ref 0.7–4.0)
MCH: 34.7 pg — ABNORMAL HIGH (ref 26.0–34.0)
MCHC: 32.9 g/dL (ref 30.0–36.0)
MCV: 105.3 fL — ABNORMAL HIGH (ref 80.0–100.0)
Monocytes Absolute: 0.7 10*3/uL (ref 0.1–1.0)
Monocytes Relative: 12 %
Neutro Abs: 2.9 10*3/uL (ref 1.7–7.7)
Neutrophils Relative %: 47 %
Platelets: 281 10*3/uL (ref 150–400)
RBC: 3 MIL/uL — ABNORMAL LOW (ref 3.87–5.11)
RDW: 17.2 % — ABNORMAL HIGH (ref 11.5–15.5)
WBC: 6.2 10*3/uL (ref 4.0–10.5)
nRBC: 1 % — ABNORMAL HIGH (ref 0.0–0.2)

## 2023-02-25 LAB — COMPREHENSIVE METABOLIC PANEL
ALT: 16 U/L (ref 0–44)
AST: 16 U/L (ref 15–41)
Albumin: 4 g/dL (ref 3.5–5.0)
Alkaline Phosphatase: 35 U/L — ABNORMAL LOW (ref 38–126)
Anion gap: 12 (ref 5–15)
BUN: 15 mg/dL (ref 8–23)
CO2: 25 mmol/L (ref 22–32)
Calcium: 9.2 mg/dL (ref 8.9–10.3)
Chloride: 103 mmol/L (ref 98–111)
Creatinine, Ser: 0.81 mg/dL (ref 0.44–1.00)
GFR, Estimated: >60 mL/min (ref >60)
Glucose, Bld: 168 mg/dL — ABNORMAL HIGH (ref 70–99)
Potassium: 3.8 mmol/L (ref 3.5–5.1)
Sodium: 140 mmol/L (ref 135–145)
Total Bilirubin: 1.3 mg/dL — ABNORMAL HIGH (ref 0.0–1.2)
Total Protein: 7.3 g/dL (ref 6.5–8.1)

## 2023-02-25 LAB — FERRITIN: Ferritin: 355 ng/mL — ABNORMAL HIGH (ref 11–307)

## 2023-02-25 LAB — VITAMIN B12: Vitamin B-12: 446 pg/mL (ref 180–914)

## 2023-02-25 LAB — LACTATE DEHYDROGENASE: LDH: 108 U/L (ref 98–192)

## 2023-02-25 LAB — IRON AND TIBC
Iron: 180 ug/dL — ABNORMAL HIGH (ref 28–170)
Saturation Ratios: 54 % — ABNORMAL HIGH (ref 10.4–31.8)
TIBC: 334 ug/dL (ref 250–450)
UIBC: 154 ug/dL

## 2023-02-25 LAB — FOLATE: Folate: 29.2 ng/mL (ref >5.9)

## 2023-02-28 LAB — METHYLMALONIC ACID, SERUM: Methylmalonic Acid, Quantitative: 277 nmol/L (ref 0–378)

## 2023-03-03 ENCOUNTER — Inpatient Hospital Stay: Payer: Medicare PPO | Admitting: Oncology

## 2023-03-09 ENCOUNTER — Inpatient Hospital Stay: Payer: Medicare PPO | Attending: Hematology | Admitting: Oncology

## 2023-03-09 ENCOUNTER — Encounter: Payer: Self-pay | Admitting: Oncology

## 2023-03-09 DIAGNOSIS — Z87891 Personal history of nicotine dependence: Secondary | ICD-10-CM | POA: Insufficient documentation

## 2023-03-09 DIAGNOSIS — D469 Myelodysplastic syndrome, unspecified: Secondary | ICD-10-CM | POA: Diagnosis not present

## 2023-03-09 DIAGNOSIS — D539 Nutritional anemia, unspecified: Secondary | ICD-10-CM

## 2023-03-09 NOTE — Progress Notes (Signed)
 [facility] [address], [place] [address]    Clinic Day:  03/09/2023  Referring physician: Shona Norleen PEDLAR, MD  Patient Care Team: Shona Norleen PEDLAR, MD as PCP - General (Internal Medicine) Rogers Hai, MD as Consulting Physician (Hematology)   ASSESSMENT & PLAN:   Assessment: 1.  MDS with ring sideroblasts: - Seen at the request of Laveda Creed, NP for macrocytic anemia. - Nutritional deficiency workup was negative.  Myeloma workup was negative.  Kappa light chains are mildly elevated at 45 with ratio of 1.78. - US  abdomen (03/26/2022): Spleen is mildly enlarged measuring 11.8 cm in length and volume of 444 cc. - Serum EPO: 57.7 - BMBX (04/15/2022): Hypercellular for age with dyspoietic changes associated with ring sideroblasts, more than 15% of erythroid precursors.  No increase in blasts noted.  No monoclonal B-cell population.  No increase in plasma cells. - NGS: SF3B1 - MDS FISH: Normal.  Cytogenetics: 46, XX[20]   2.  Other history - PMH: Hypothyroidism, diabetes, hyperlipidemia, obesity, carotid artery stenosis, osteoporosis  - SOCIAL: She lives at home alone and is fully independent. She is retired from work as librarian, academic of a development worker, international aid. She ambulates without assistive device, keeps up with her ADLs, and stays active by taking daily walks. She has a daughter who is mentally handicapped and lives in a group home. She is a former smoker (smoked about 1 PPD x 30 years), quit in 1983. She denies any alcohol or illicit drug use.  - FAMILY: Father had lymphoma  Plan: 1.  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 - Denies any bleeding per rectum or melena. -Labs from 02/25/2023 shows a hemoglobin of 10.4, hematocrit 31.6 normal white and platelet counts.   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 - We have discussed initiating her on Luspatercept  if her hemoglobin  drops below 10.  She is agreeable. -Recommend continued surveillance every 3 months.  She knows to call with changes or concerns.   PLAN SUMMARY: >> Return to clinic in 3 months with labs a few days before.     Orders Placed This Encounter  Procedures   Ferritin    Standing Status:   Future    Expected Date:   06/06/2023    Expiration Date:   03/08/2024   Iron and TIBC ([facility] ONLY)    Standing Status:   Future    Expected Date:   06/06/2023    Expiration Date:   03/08/2024   CBC with Differential    Standing Status:   Future    Expected Date:   06/06/2023    Expiration Date:   03/08/2024   Comprehensive metabolic panel    Standing Status:   Future    Expected Date:   06/06/2023    Expiration Date:   03/08/2024   Lactate dehydrogenase    Standing Status:   Future    Expected Date:   06/06/2023    Expiration Date:   03/08/2024   I spent 30 minutes dedicated to the care of this patient (face-to-face and non-face-to-face) on the date of the encounter to include what is described in the assessment and plan.   Delon FORBES Hope, NP   2/5/202510:37 AM  CHIEF COMPLAINT:   Diagnosis: Myelodysplastic syndrome with ring sideroblasts  Prior Therapy: none  Current Therapy: Luspatercept   HISTORY OF PRESENT ILLNESS:  Review of labs in EMR shows mild  macrocytic anemia since at least 2021 (normal hemoglobin in 2018) with Hgb 11.3 and MCV 102.4. Lab trend from PCP reveals worsening macrocytic anemia over the past 2 years. (06/20/2020) Hgb 11.0/MCV 104, WBC 5.1, platelets 291, nRBC 1% (12/24/2020) Hgb 10.6/MCV 102, WBC 5.7, platelets 296, nRBC 1% (07/08/2021) Hgb 10.6/MCV 104, WBC 6.1, platelets 298, nRBC 2% (01/05/2022) Hgb 9.9/MCV 105, WBC 5.2, platelets 275, nRBC 1% Additional labs from 01/05/2022 show mildly elevated TSH 4.68 with normal T41.31. Nutritional panel (01/14/2022) showed normal B12 559 and normal folic acid  6.7; iron saturation 61% with elevated serum iron 157.  Kidney  function appears normal with creatinine 0.88 and GFR 67  INTERVAL HISTORY:   Kelly Cook is a 83 y.o. female presenting to clinic today for follow up of macrocytic anemia. She was last seen by me on 02/24/2022.  In the interim, she denies any hospitalizations, surgeries or changes to her baseline health.  Today, she states that she is doing well overall. Her appetite level is at 100%. Her energy level is at 75%.   Reports she has had to winter colds but has not required antibiotics.  Taking over-the-counter medications most recent 1 she completely got over about a week ago.  She denies infections within the last 3 months, fever, night sweats, SOB, unexpected weight loss, fatigue, weakness, and hematochezia.  Passed away in 11/09/22.  She is starting to get back to her new every day normal.  She has a daughter who lives in a group home who is mentally handicapped and has had some health issues going on as well.  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   Coccyx feels prominent and deviated to left, will try NSAID, if not better xray   Constipation 06/16/2012   Diabetes mellitus    Gall bladder stones    Hemorrhoids    History of kidney stones    Hypothyroid 06/16/2012   Hypothyroid 06/16/2012   Osteopenia    Osteoporosis    Otitis externa 12/01/2012   Shingles    Urinary incontinence 07/02/2015    Surgical History: Past Surgical History:  Procedure Laterality Date   ABDOMINAL HYSTERECTOMY     dysfunctional uterine bleeding   APPENDECTOMY     CATARACT EXTRACTION W/PHACO Left 11/14/2017   Procedure: CATARACT EXTRACTION PHACO AND INTRAOCULAR LENS PLACEMENT (IOC);  Surgeon: Perley Hamilton, MD;  Location: [facility];  Service: Ophthalmology;  Laterality: Left;  CDE: 9.02   CATARACT EXTRACTION W/PHACO Right 11/28/2017   Procedure: CATARACT EXTRACTION PHACO AND  INTRAOCULAR LENS PLACEMENT RIGHT EYE CDE=10.42;  Surgeon: Perley Hamilton, MD;  Location: [facility];  Service: Ophthalmology;  Laterality: Right;  right   CHOLECYSTECTOMY N/A 06/20/2019   Procedure: LAPAROSCOPIC CHOLECYSTECTOMY;  Surgeon: Kallie Manuelita BROCKS, MD;  Location: [facility];  Service: General;  Laterality: N/A;   COLONOSCOPY N/A 11/27/2015   Procedure: COLONOSCOPY;  Surgeon: Claudis RAYMOND Rivet, MD;  Location: [facility];  Service: Endoscopy;  Laterality: N/A;  730   EXTRACORPOREAL SHOCK WAVE LITHOTRIPSY Left 11/01/2016   Procedure: LEFT EXTRACORPOREAL SHOCK WAVE LITHOTRIPSY (ESWL);  Surgeon: Nieves Cough, MD;  Location: [facility];  Service: Urology;  Laterality: Left;   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     TONSILLECTOMY     adenoids   TUBAL LIGATION      Social History: Social History  Socioeconomic History   Marital status: Widowed    Spouse name: Not on file   Number of children: Not on file   Years of education: Not on file   Highest education level: Not on file  Occupational History   Not on file  Tobacco Use   Smoking status: Former    Types: Cigarettes   Smokeless tobacco: Never  Vaping Use   Vaping status: Never Used  Substance and Sexual Activity   Alcohol use: No   Drug use: No   Sexual activity: Not Currently    Birth control/protection: Surgical    Comment: hyst  Other Topics Concern   Not on file  Social History Narrative   Not on file   Social Drivers of Health   Financial Resource Strain: Not on file  Food Insecurity: No Food Insecurity (02/22/2022)   Hunger Vital Sign    Worried About Running Out of Food in the Last Year: Never true    Ran Out of Food in the Last Year: Never true  Transportation Needs: No Transportation Needs (02/22/2022)   PRAPARE - Administrator, Civil Service (Medical): No    Lack of Transportation (Non-Medical): No  Physical Activity: Not on file  Stress: Not on file  Social Connections:  Not on file  Intimate Partner Violence: Not At Risk (02/22/2022)   Humiliation, Afraid, Rape, and Kick questionnaire    Fear of Current or Ex-Partner: No    Emotionally Abused: No    Physically Abused: No    Sexually Abused: No    Family History: Family History  Problem Relation Age of Onset   Cancer Father    Dementia Mother    Diabetes Brother    Heart disease Brother    Mental retardation Daughter        in group home    Current Medications:  Current Outpatient Medications:    1st Choice Lancets Ultra Thin MISC, , Disp: , Rfl:    ACCU-CHEK AVIVA PLUS test strip, USE TO TEST BLOOD SUGAR EVERY DAY, Disp: , Rfl:    acetaminophen  (TYLENOL ) 500 MG tablet, Take 1,000 mg by mouth every 6 (six) hours as needed for moderate pain. , Disp: , Rfl:    ascorbic acid (VITAMIN C) 500 MG tablet, Take 500 mg by mouth at bedtime., Disp: , Rfl:    aspirin  EC 81 MG tablet, Take 81 mg by mouth daily. Swallow whole., Disp: , Rfl:    Cholecalciferol  (DIALYVITE VITAMIN D  5000) 125 MCG (5000 UT) capsule, Take 5,000 Units by mouth at bedtime., Disp: , Rfl:    levothyroxine  (SYNTHROID ) 100 MCG tablet, Take 100 mcg by mouth daily before breakfast., Disp: , Rfl:    metFORMIN  (GLUCOPHAGE -XR) 500 MG 24 hr tablet, Take 500 mg by mouth every evening., Disp: , Rfl:    Allergies: No Known Allergies  REVIEW OF SYSTEMS:   Review of Systems  Hematological:  Bruises/bleeds easily.  Psychiatric/Behavioral:  Positive for depression and sleep disturbance.      VITALS:   Blood pressure 135/64, pulse 72, temperature (!) 96.7 F (35.9 C), temperature source Tympanic, resp. rate 18, weight 156 lb 12 oz (71.1 kg), SpO2 97%.  Wt Readings from Last 3 Encounters:  03/09/23 156 lb 12 oz (71.1 kg)  11/25/22 153 lb 8 oz (69.6 kg)  08/18/22 158 lb 14.4 oz (72.1 kg)    Body mass index is 29.62 kg/m.  Performance status (ECOG): 1 - Symptomatic but completely ambulatory  PHYSICAL EXAM:  Physical  Exam Constitutional:      Appearance: Normal appearance.  HENT:     Head: Normocephalic and atraumatic.  Eyes:     Pupils: Pupils are equal, round, and reactive to light.  Cardiovascular:     Rate and Rhythm: Normal rate and regular rhythm.     Heart sounds: Normal heart sounds. No murmur heard. Pulmonary:     Effort: Pulmonary effort is normal.     Breath sounds: Normal breath sounds. No wheezing.  Abdominal:     General: Bowel sounds are normal. There is no distension.     Palpations: Abdomen is soft.     Tenderness: There is no abdominal tenderness.  Musculoskeletal:        General: Normal range of motion.     Cervical back: Normal range of motion.  Skin:    General: Skin is warm and dry.     Findings: No rash.  Neurological:     Mental Status: She is alert and oriented to person, place, and time.  Psychiatric:        Judgment: Judgment normal.     LABS:      Latest Ref Rng & Units 02/25/2023    9:34 AM 11/19/2022    9:54 AM 08/18/2022    2:34 PM  CBC  WBC 4.0 - 10.5 K/uL 6.2  6.8  7.2   Hemoglobin 12.0 - 15.0 g/dL 89.5  89.5  89.7   Hematocrit 36.0 - 46.0 % 31.6  32.2  31.2   Platelets 150 - 400 K/uL 281  293  293       Latest Ref Rng & Units 02/25/2023    9:34 AM 02/22/2022    2:00 PM 06/18/2019   11:30 AM  CMP  Glucose 70 - 99 mg/dL 831  877  872   BUN 8 - 23 mg/dL 15  18  19    Creatinine 0.44 - 1.00 mg/dL 9.18  9.14  9.12   Sodium 135 - 145 mmol/L 140  138  138   Potassium 3.5 - 5.1 mmol/L 3.8  3.6  3.8   Chloride 98 - 111 mmol/L 103  105  108   CO2 22 - 32 mmol/L 25  24  21    Calcium 8.9 - 10.3 mg/dL 9.2  9.2  8.7   Total Protein 6.5 - 8.1 g/dL 7.3  7.9    Total Bilirubin 0.0 - 1.2 mg/dL 1.3  0.9    Alkaline Phos 38 - 126 U/L 35  38    AST 15 - 41 U/L 16  18    ALT 0 - 44 U/L 16  21       No results found for: CEA1, CEA / No results found for: CEA1, CEA No results found for: PSA1 No results found for: CAN199 No results found for:  RJW874  Lab Results  Component Value Date   TOTALPROTELP 7.3 02/22/2022   TOTALPROTELP 7.2 02/22/2022   ALBUMINELP 4.0 02/22/2022   A1GS 0.2 02/22/2022   A2GS 0.5 02/22/2022   BETS 1.1 02/22/2022   GAMS 1.4 02/22/2022   MSPIKE Not Observed 02/22/2022   SPEI Comment 02/22/2022   Lab Results  Component Value Date   TIBC 334 02/25/2023   TIBC 327 11/19/2022   FERRITIN 355 (H) 02/25/2023   FERRITIN 378 (H) 11/19/2022   FERRITIN 298 02/22/2022   IRONPCTSAT 54 (H) 02/25/2023   IRONPCTSAT 31 11/19/2022   Lab Results  Component Value Date   LDH 108 02/25/2023  LDH 118 11/19/2022   LDH 117 02/22/2022   Bone Marrow Biopsy 04/15/22:  DIAGNOSIS:   BONE MARROW, ASPIRATE, CLOT, CORE:  -Hypercellular bone marrow with dyspoietic changes  -Slight polyclonal plasmacytosis  -See comment   PERIPHERAL BLOOD:  -Macrocytic anemia  -Mild neutrophilic left shift   COMMENT:  The bone marrow is hypercellular for age with dyspoietic changes associated with abundant ring sideroblasts.  No increase in blastic cells identified.  The findings are very concerning for involvement by a low-grade myelodysplastic syndrome with ring sideroblasts.  The  differential diagnosis includes secondary changes related to lead toxicity, pyridoxine deficiency, alcohol, medication, etc.  Correlation with cytogenetic and FISH studies is recommended.   STUDIES:   No results found.

## 2023-03-25 DIAGNOSIS — N898 Other specified noninflammatory disorders of vagina: Secondary | ICD-10-CM | POA: Diagnosis not present

## 2023-03-25 DIAGNOSIS — R300 Dysuria: Secondary | ICD-10-CM | POA: Diagnosis not present

## 2023-04-07 ENCOUNTER — Encounter: Payer: Self-pay | Admitting: Adult Health

## 2023-04-07 ENCOUNTER — Ambulatory Visit: Admitting: Adult Health

## 2023-04-07 VITALS — BP 137/71 | HR 79 | Ht 61.0 in | Wt 154.0 lb

## 2023-04-07 DIAGNOSIS — K649 Unspecified hemorrhoids: Secondary | ICD-10-CM | POA: Diagnosis not present

## 2023-04-07 DIAGNOSIS — N898 Other specified noninflammatory disorders of vagina: Secondary | ICD-10-CM | POA: Insufficient documentation

## 2023-04-07 DIAGNOSIS — N952 Postmenopausal atrophic vaginitis: Secondary | ICD-10-CM

## 2023-04-07 DIAGNOSIS — Z90710 Acquired absence of both cervix and uterus: Secondary | ICD-10-CM | POA: Diagnosis not present

## 2023-04-07 MED ORDER — HYDROCORT-PRAMOXINE (PERIANAL) 2.5-1 % EX CREA: 1.0000 | TOPICAL_CREAM | Freq: Three times a day (TID) | CUTANEOUS | 1 refills | Status: DC

## 2023-04-07 NOTE — Progress Notes (Signed)
  Subjective:     Patient ID: Kelly Cook, female   DOB: 1940/09/18, 83 y.o.   MRN: [id number]  HPI Kelly Cook is a 83 year old white female, widowed, sp hysterectomy, in complaining of vaginal irritation, feels swollen, for ab out 2 weeks. And has hemorrhoids, bowels are loose since having GB out. She started using estrace vaginal cream about a week ago.  PCP is Dr Margo Aye.  Review of Systems +vaginal irritation, feels swollen, for ab out 2 weeks, seems worse with standing for long periods +has hemorrhoids, bowels are loose since having GB out. She is not having sex Reviewed past medical,surgical, social and family history. Reviewed medications and allergies.     Objective:   Physical Exam BP 137/71 (BP Location: Right Arm, Patient Position: Sitting, Cuff Size: Normal)   Pulse 79   Ht 5\' 1"  (1.549 m)   Wt 154 lb (69.9 kg)   BMI 29.10 kg/m     Skin warm and dry.Pelvic: external genitalia is normal in appearance, no lesions, vagina: atrophic, mild redness and irritation left side wall,and it is tender, Dr Charlotta Newton in for co exam,urethra has small caruncle, cervix and uterus are absent, adnexa: no masses or tenderness noted. Bladder is non tender and no masses felt. Has external hemorrhoids  AA is 0 Fall risk is kow    04/07/2023    3:24 PM 02/22/2022   12:53 PM 07/06/2017    9:17 AM  Depression screen PHQ 2/9  Decreased Interest 0 0 0  Down, Depressed, Hopeless 0 0 0  PHQ - 2 Score 0 0 0  Altered sleeping 1  0  Tired, decreased energy 0  0  Change in appetite 0  1  Feeling bad or failure about yourself  0  0  Trouble concentrating 0  0  Moving slowly or fidgety/restless 0  0  Suicidal thoughts 0  0  PHQ-9 Score 1  1  Difficult doing work/chores   Not difficult at all       04/07/2023    3:24 PM  GAD 7 : Generalized Anxiety Score  Nervous, Anxious, on Edge 1  Control/stop worrying 0  Worry too much - different things 0  Trouble relaxing 0  Restless 0  Easily annoyed or irritable  0  Afraid - awful might happen 0  Total GAD 7 Score 1    Pt gave verbal consent for exam without chaperone. Assessment:     1. Vaginal irritation (Primary) Continue estrace cream will recheck 04/25/23  2. Hemorrhoids, unspecified hemorrhoid type Will rx analpram HC, if too expensive, can get compounded cream at [facility] ordered this encounter  Medications   hydrocortisone-pramoxine (ANALPRAM HC) 2.5-1 % rectal cream    Sig: Place 1 Application rectally 3 (three) times daily.    Dispense:  30 g    Refill:  1    Supervising Provider:   Duane Lope H [[id number]]    Can use aquaphor  3N1 if irritated around rectum  Try frozen peas to peri area prn   3. Vaginal atrophy Continue using etrace vaginal cream   4. S/P hysterectomy     Plan:     Follow up 04/25/23

## 2023-04-25 ENCOUNTER — Ambulatory Visit: Admitting: Adult Health

## 2023-04-25 ENCOUNTER — Encounter: Payer: Self-pay | Admitting: Adult Health

## 2023-04-25 VITALS — BP 146/80 | HR 79 | Ht 61.0 in | Wt 154.5 lb

## 2023-04-25 DIAGNOSIS — Z90710 Acquired absence of both cervix and uterus: Secondary | ICD-10-CM | POA: Diagnosis not present

## 2023-04-25 DIAGNOSIS — R030 Elevated blood-pressure reading, without diagnosis of hypertension: Secondary | ICD-10-CM | POA: Diagnosis not present

## 2023-04-25 DIAGNOSIS — K649 Unspecified hemorrhoids: Secondary | ICD-10-CM | POA: Diagnosis not present

## 2023-04-25 DIAGNOSIS — N898 Other specified noninflammatory disorders of vagina: Secondary | ICD-10-CM | POA: Diagnosis not present

## 2023-04-25 DIAGNOSIS — N952 Postmenopausal atrophic vaginitis: Secondary | ICD-10-CM

## 2023-04-25 NOTE — Progress Notes (Signed)
  Subjective:     Patient ID: Kelly Cook, female   DOB: [birth year], 83 y.o.   MRN: [id number]  HPI Kelly Cook is a 83 year old white female, widowed, sp hysterectomy back in follow up on vaginal irritation and hemorrhoids and is feeling better.  PCP is Dr Margo Aye  Review of Systems Feels better on vaginal irritation and hemorrhoids Reviewed past medical,surgical, social and family history. Reviewed medications and allergies.     Objective:   Physical Exam BP (!) 146/80 (BP Location: Left Arm, Patient Position: Sitting, Cuff Size: Normal)   Pulse 79   Ht 5\' 1"  (1.549 m)   Wt 154 lb 8 oz (70.1 kg)   BMI 29.19 kg/m     Skin warm and dry.Pelvic: external genitalia is normal in appearance no lesions, vagina: pale, does not feels as irritated most days.  Upstream - 04/25/23 1020       Pregnancy Intention Screening   Does the patient want to become pregnant in the next year? N/A    Does the patient's partner want to become pregnant in the next year? N/A    Would the patient like to discuss contraceptive options today? N/A      Contraception Wrap Up   Current Method Female Sterilization   hyst   End Method Female Sterilization   hyst   Contraception Counseling Provided No            Examination chaperoned by Malachy Mood LPN Assessment:     1. Vaginal atrophy (Primary) Continue estrace vaginal cream, 3 x weekly  2. Vaginal irritation Feels better  3. S/P hysterectomy  4. Hemorrhoids, unspecified hemorrhoid type Feels better with analpram HC and Aquaphor 3N1  5. Elevated BP without diagnosis of hypertension Keep check on BP at home and follow up with PCP or me     Plan:     Follow up prn

## 2023-05-06 ENCOUNTER — Telehealth: Payer: Self-pay | Admitting: *Deleted

## 2023-05-06 NOTE — Telephone Encounter (Signed)
 Pt did have another flare up of vaginal irritation. It's better now. Pt wanted to report BP readings. These are from last week. BP 129/76 on Tuesday am. 145/76 Tuesday afternoon. Wednesday am it was 145/61 and 115/62 on Wednesday evening. Thursday am it was 127/80 and Friday am it was  125/82 and 131/61 in the evening. Please advise. Thanks! JSY

## 2023-05-06 NOTE — Telephone Encounter (Signed)
 Line was busy

## 2023-05-09 NOTE — Telephone Encounter (Signed)
 Pt using estrace vaginal cream 2-3 x a week again, keep me posted. BP looks good, but has had dizziness at times

## 2023-06-02 ENCOUNTER — Inpatient Hospital Stay: Payer: Medicare PPO | Attending: Hematology

## 2023-06-02 DIAGNOSIS — Z7982 Long term (current) use of aspirin: Secondary | ICD-10-CM | POA: Insufficient documentation

## 2023-06-02 DIAGNOSIS — Z7984 Long term (current) use of oral hypoglycemic drugs: Secondary | ICD-10-CM | POA: Insufficient documentation

## 2023-06-02 DIAGNOSIS — Z87891 Personal history of nicotine dependence: Secondary | ICD-10-CM | POA: Diagnosis not present

## 2023-06-02 DIAGNOSIS — E785 Hyperlipidemia, unspecified: Secondary | ICD-10-CM | POA: Diagnosis not present

## 2023-06-02 DIAGNOSIS — E039 Hypothyroidism, unspecified: Secondary | ICD-10-CM | POA: Insufficient documentation

## 2023-06-02 DIAGNOSIS — D469 Myelodysplastic syndrome, unspecified: Secondary | ICD-10-CM | POA: Diagnosis not present

## 2023-06-02 DIAGNOSIS — E119 Type 2 diabetes mellitus without complications: Secondary | ICD-10-CM | POA: Diagnosis not present

## 2023-06-02 DIAGNOSIS — D539 Nutritional anemia, unspecified: Secondary | ICD-10-CM

## 2023-06-02 LAB — COMPREHENSIVE METABOLIC PANEL WITH GFR
ALT: 16 U/L (ref 0–44)
AST: 17 U/L (ref 15–41)
Albumin: 3.7 g/dL (ref 3.5–5.0)
Alkaline Phosphatase: 34 U/L — ABNORMAL LOW (ref 38–126)
Anion gap: 9 (ref 5–15)
BUN: 17 mg/dL (ref 8–23)
CO2: 24 mmol/L (ref 22–32)
Calcium: 9.3 mg/dL (ref 8.9–10.3)
Chloride: 104 mmol/L (ref 98–111)
Creatinine, Ser: 0.8 mg/dL (ref 0.44–1.00)
GFR, Estimated: >60 mL/min (ref >60)
Glucose, Bld: 176 mg/dL — ABNORMAL HIGH (ref 70–99)
Potassium: 3.8 mmol/L (ref 3.5–5.1)
Sodium: 137 mmol/L (ref 135–145)
Total Bilirubin: 1.2 mg/dL (ref 0.0–1.2)
Total Protein: 6.8 g/dL (ref 6.5–8.1)

## 2023-06-02 LAB — IRON AND TIBC
Iron: 196 ug/dL — ABNORMAL HIGH (ref 28–170)
Saturation Ratios: 62 % — ABNORMAL HIGH (ref 10.4–31.8)
TIBC: 314 ug/dL (ref 250–450)
UIBC: 118 ug/dL

## 2023-06-02 LAB — CBC WITH DIFFERENTIAL/PLATELET
Abs Immature Granulocytes: 0.02 10*3/uL (ref 0.00–0.07)
Basophils Absolute: 0.1 10*3/uL (ref 0.0–0.1)
Basophils Relative: 1 %
Eosinophils Absolute: 0.2 10*3/uL (ref 0.0–0.5)
Eosinophils Relative: 3 %
HCT: 28.8 % — ABNORMAL LOW (ref 36.0–46.0)
Hemoglobin: 9.2 g/dL — ABNORMAL LOW (ref 12.0–15.0)
Immature Granulocytes: 0 %
Lymphocytes Relative: 37 %
Lymphs Abs: 2.3 10*3/uL (ref 0.7–4.0)
MCH: 34.3 pg — ABNORMAL HIGH (ref 26.0–34.0)
MCHC: 31.9 g/dL (ref 30.0–36.0)
MCV: 107.5 fL — ABNORMAL HIGH (ref 80.0–100.0)
Monocytes Absolute: 0.8 10*3/uL (ref 0.1–1.0)
Monocytes Relative: 13 %
Neutro Abs: 2.8 10*3/uL (ref 1.7–7.7)
Neutrophils Relative %: 46 %
Platelets: 261 10*3/uL (ref 150–400)
RBC: 2.68 MIL/uL — ABNORMAL LOW (ref 3.87–5.11)
RDW: 16.8 % — ABNORMAL HIGH (ref 11.5–15.5)
WBC: 6.1 10*3/uL (ref 4.0–10.5)
nRBC: 1.6 % — ABNORMAL HIGH (ref 0.0–0.2)

## 2023-06-02 LAB — LACTATE DEHYDROGENASE: LDH: 117 U/L (ref 98–192)

## 2023-06-02 LAB — FERRITIN: Ferritin: 246 ng/mL (ref 11–307)

## 2023-06-09 ENCOUNTER — Inpatient Hospital Stay: Payer: Medicare PPO | Admitting: Oncology

## 2023-06-09 VITALS — BP 144/61 | HR 69 | Temp 97.5°F | Resp 18 | Ht 61.5 in | Wt 153.0 lb

## 2023-06-09 DIAGNOSIS — E119 Type 2 diabetes mellitus without complications: Secondary | ICD-10-CM | POA: Diagnosis not present

## 2023-06-09 DIAGNOSIS — E039 Hypothyroidism, unspecified: Secondary | ICD-10-CM | POA: Diagnosis not present

## 2023-06-09 DIAGNOSIS — D469 Myelodysplastic syndrome, unspecified: Secondary | ICD-10-CM | POA: Diagnosis not present

## 2023-06-09 DIAGNOSIS — E785 Hyperlipidemia, unspecified: Secondary | ICD-10-CM | POA: Diagnosis not present

## 2023-06-09 DIAGNOSIS — Z7982 Long term (current) use of aspirin: Secondary | ICD-10-CM | POA: Diagnosis not present

## 2023-06-09 DIAGNOSIS — D539 Nutritional anemia, unspecified: Secondary | ICD-10-CM | POA: Diagnosis not present

## 2023-06-09 DIAGNOSIS — Z7984 Long term (current) use of oral hypoglycemic drugs: Secondary | ICD-10-CM | POA: Diagnosis not present

## 2023-06-09 DIAGNOSIS — Z87891 Personal history of nicotine dependence: Secondary | ICD-10-CM | POA: Diagnosis not present

## 2023-06-09 NOTE — Progress Notes (Signed)
 [facility] [address], [place] [address]    Clinic Day:  06/09/2023  Referring physician: Omie Bickers, MD  Patient Care Team: Omie Bickers, MD as PCP - General (Internal Medicine) Paulett Boros, MD as Consulting Physician (Hematology)   ASSESSMENT & PLAN:   Assessment: 1.  MDS with ring sideroblasts: - Seen at the request of Newt Barefoot, NP for macrocytic anemia. - Nutritional deficiency workup was negative.  Myeloma workup was negative.  Kappa light chains are mildly elevated at 45 with ratio of 1.78. - US  abdomen (03/26/2022): Spleen is mildly enlarged measuring 11.8 cm in length and volume of 444 cc. - Serum EPO: 57.7 - BMBX (04/15/2022): Hypercellular for age with dyspoietic changes associated with ring sideroblasts, more than 15% of erythroid precursors.  No increase in blasts noted.  No monoclonal B-cell population.  No increase in plasma cells. - NGS: SF3B1 - MDS FISH: Normal.  Cytogenetics: 46, XX[20]   2.  Other history - PMH: Hypothyroidism, diabetes, hyperlipidemia, obesity, carotid artery stenosis, osteoporosis  - SOCIAL: She lives at home alone and is fully independent. She is retired from work as Librarian, academic of a Development worker, international aid. She ambulates without assistive device, keeps up with her ADLs, and stays active by taking daily walks. She has a daughter who is mentally handicapped and lives in a group home. She is a former smoker (smoked about 1 PPD x 30 years), quit in 1983. She denies any alcohol or illicit drug use.  - FAMILY: Father had lymphoma  Plan: 1.  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 - Denies any bleeding per rectum or melena. -Labs from 06/02/2023 show iron saturation 62%, TIBC 314, LDH 117, hemoglobin 9.2 (10.4) and MCV of 107.5.  White blood cell count is normal.   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 - We have discussed repeating labs in 1 month and  see if she needs to initiate treatment with luspatercept .  She is agreeable. -Recommend labs only in 1 month with telephone visit.   PLAN SUMMARY: >> Return to clinic in 1 month with telephone visit.     No orders of the defined types were placed in this encounter.  I spent 30 minutes dedicated to the care of this patient (face-to-face and non-face-to-face) on the date of the encounter to include what is described in the assessment and plan.   Aurther Blue, NP   5/8/20258:55 AM  CHIEF COMPLAINT:   Diagnosis: Myelodysplastic syndrome with ring sideroblasts  Prior Therapy: none  Current Therapy: Luspatercept   HISTORY OF PRESENT ILLNESS:  Review of labs in EMR shows mild macrocytic anemia since at least 2021 (normal hemoglobin in 2018) with Hgb 11.3 and MCV 102.4. Lab trend from PCP reveals worsening macrocytic anemia over the past 2 years. (06/20/2020) Hgb 11.0/MCV 104, WBC 5.1, platelets 291, nRBC 1% (12/24/2020) Hgb 10.6/MCV 102, WBC 5.7, platelets 296, nRBC 1% (07/08/2021) Hgb 10.6/MCV 104, WBC 6.1, platelets 298, nRBC 2% (01/05/2022) Hgb 9.9/MCV 105, WBC 5.2, platelets 275, nRBC 1% Additional labs from 01/05/2022 show mildly elevated TSH 4.68 with normal T41.31. Nutritional panel (01/14/2022) showed normal B12 559 and normal folic acid  6.7; iron saturation 61% with elevated serum iron 157.  Kidney function appears normal with creatinine 0.88 and GFR 67  INTERVAL HISTORY:   Kelly Cook is a 83 y.o. female presenting to clinic today for follow up of macrocytic anemia. She was last seen by me  on 02/24/2022.  In the interim, she denies any hospitalizations, surgeries or changes to her baseline health.  She was recently placed on estradiol vaginal irritation and atrophy.  This appears to be helping some.  Today, she states that she is doing well overall. Her appetite level is at 100%. Her energy level is at 75%.    She denies infections within the last 3 months, fever, night  sweats, SOB, unexpected weight loss,  weakness, and hematochezia.  Reports feeling slightly more tired than usual although her husband passed away in 05-Nov-2023 and her daughter lives in a group home and is mentally handicapped which has kept her busy.  She has been having outbursts where she starts screaming or crying here recently but reports this happens every once in a while.  Feels like she has been under quite a bit of stress so she feels like the fatigue is likely related to that.   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   Coccyx feels prominent and deviated to left, will try NSAID, if not better xray   Constipation 06/16/2012   Diabetes mellitus    Gall bladder stones    Hemorrhoids    History of kidney stones    Hypothyroid 06/16/2012   Hypothyroid 06/16/2012   Osteopenia    Osteoporosis    Otitis externa 12/01/2012   Shingles    Urinary incontinence 07/02/2015    Surgical History: Past Surgical History:  Procedure Laterality Date   ABDOMINAL HYSTERECTOMY     dysfunctional uterine bleeding   APPENDECTOMY     CATARACT EXTRACTION W/PHACO Left 11/14/2017   Procedure: CATARACT EXTRACTION PHACO AND INTRAOCULAR LENS PLACEMENT (IOC);  Surgeon: Anner Kill, MD;  Location: [facility];  Service: Ophthalmology;  Laterality: Left;  CDE: 9.02   CATARACT EXTRACTION W/PHACO Right 11/28/2017   Procedure: CATARACT EXTRACTION PHACO AND INTRAOCULAR LENS PLACEMENT RIGHT EYE CDE=10.42;  Surgeon: Anner Kill, MD;  Location: [facility];  Service: Ophthalmology;  Laterality: Right;  right   CHOLECYSTECTOMY N/A 06/20/2019   Procedure: LAPAROSCOPIC CHOLECYSTECTOMY;  Surgeon: Awilda Bogus, MD;  Location: [facility];  Service: General;  Laterality: N/A;   COLONOSCOPY N/A 11/27/2015   Procedure: COLONOSCOPY;  Surgeon: Ruby Corporal, MD;  Location: [facility];  Service: Endoscopy;  Laterality: N/A;  730   EXTRACORPOREAL SHOCK WAVE  LITHOTRIPSY Left 11/01/2016   Procedure: LEFT EXTRACORPOREAL SHOCK WAVE LITHOTRIPSY (ESWL);  Surgeon: Christina Coyer, MD;  Location: [facility];  Service: Urology;  Laterality: Left;   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     TONSILLECTOMY     adenoids   TUBAL LIGATION      Social History: Social History   Socioeconomic History   Marital status: Widowed    Spouse name: Not on file   Number of children: Not on file   Years of education: Not on file   Highest education level: Not on file  Occupational History   Not on file  Tobacco Use   Smoking status: Former    Types: Cigarettes   Smokeless tobacco: Never  Vaping Use   Vaping status: Never Used  Substance and Sexual Activity   Alcohol use: No   Drug use: No   Sexual activity: Not Currently    Birth control/protection: Surgical    Comment: hyst  Other Topics Concern   Not on file  Social History Narrative   Not on  file   Social Drivers of Health   Financial Resource Strain: Low Risk  (04/07/2023)   Overall Financial Resource Strain (CARDIA)    Difficulty of Paying Living Expenses: Not very hard  Food Insecurity: No Food Insecurity (04/07/2023)   Hunger Vital Sign    Worried About Running Out of Food in the Last Year: Never true    Ran Out of Food in the Last Year: Never true  Transportation Needs: No Transportation Needs (02/22/2022)   PRAPARE - Administrator, Civil Service (Medical): No    Lack of Transportation (Non-Medical): No  Physical Activity: Insufficiently Active (04/07/2023)   Exercise Vital Sign    Days of Exercise per Week: 2 days    Minutes of Exercise per Session: 40 min  Stress: Stress Concern Present (04/07/2023)   [employer] of Occupational Health - Occupational Stress Questionnaire    Feeling of Stress : To some extent  Social Connections: Moderately Integrated (04/07/2023)   Social Connection and Isolation Panel [NHANES]    Frequency of Communication with  Friends and Family: More than three times a week    Frequency of Social Gatherings with Friends and Family: Twice a week    Attends Religious Services: More than 4 times per year    Active Member of [employer] or Organizations: Yes    Attends Banker Meetings: More than 4 times per year    Marital Status: Widowed  Intimate Partner Violence: Not At Risk (04/07/2023)   Humiliation, Afraid, Rape, and Kick questionnaire    Fear of Current or Ex-Partner: No    Emotionally Abused: No    Physically Abused: No    Sexually Abused: No    Family History: Family History  Problem Relation Age of Onset   Cancer Father    Dementia Mother    Diabetes Brother    Heart disease Brother    Mental retardation Daughter        in group home    Current Medications:  Current Outpatient Medications:    1st Choice Lancets Ultra Thin MISC, , Disp: , Rfl:    ACCU-CHEK AVIVA PLUS test strip, USE TO TEST BLOOD SUGAR EVERY DAY, Disp: , Rfl:    acetaminophen  (TYLENOL ) 500 MG tablet, Take 1,000 mg by mouth every 6 (six) hours as needed for moderate pain. , Disp: , Rfl:    aspirin  EC 81 MG tablet, Take 81 mg by mouth daily. Swallow whole., Disp: , Rfl:    Cholecalciferol (DIALYVITE VITAMIN D 5000) 125 MCG (5000 UT) capsule, Take 5,000 Units by mouth at bedtime., Disp: , Rfl:    estradiol (ESTRACE) 0.1 MG/GM vaginal cream, Place vaginally. Dime size amount nightly, Disp: , Rfl:    hydrocortisone -pramoxine (ANALPRAM HC) 2.5-1 % rectal cream, Place 1 Application rectally 3 (three) times daily., Disp: 30 g, Rfl: 1   levothyroxine (SYNTHROID) 100 MCG tablet, Take 100 mcg by mouth daily before breakfast., Disp: , Rfl:    metFORMIN (GLUCOPHAGE-XR) 500 MG 24 hr tablet, Take 500 mg by mouth every evening., Disp: , Rfl:    Allergies: No Known Allergies  REVIEW OF SYSTEMS:   Review of Systems  Constitutional:  Positive for fatigue. Negative for fever.  Gastrointestinal:  Positive for diarrhea. Negative for  abdominal distention, abdominal pain, blood in stool and constipation.  Hematological:  Does not bruise/bleed easily.     VITALS:   There were no vitals taken for this visit.  Wt Readings from Last 3 Encounters:  04/25/23  154 lb 8 oz (70.1 kg)  04/07/23 154 lb (69.9 kg)  03/09/23 156 lb 12 oz (71.1 kg)    There is no height or weight on file to calculate BMI.  Performance status (ECOG): 1 - Symptomatic but completely ambulatory  PHYSICAL EXAM:   Physical Exam Constitutional:      Appearance: Normal appearance.  Cardiovascular:     Rate and Rhythm: Normal rate and regular rhythm.  Pulmonary:     Effort: Pulmonary effort is normal.     Breath sounds: Normal breath sounds.  Abdominal:     General: Bowel sounds are normal.     Palpations: Abdomen is soft.  Musculoskeletal:        General: No swelling. Normal range of motion.  Neurological:     Mental Status: She is alert and oriented to person, place, and time. Mental status is at baseline.     LABS:      Latest Ref Rng & Units 06/02/2023   10:16 AM 02/25/2023    9:34 AM 11/19/2022    9:54 AM  CBC  WBC 4.0 - 10.5 K/uL 6.1  6.2  6.8   Hemoglobin 12.0 - 15.0 g/dL 9.2  16.1  09.6   Hematocrit 36.0 - 46.0 % 28.8  31.6  32.2   Platelets 150 - 400 K/uL 261  281  293       Latest Ref Rng & Units 06/02/2023   10:16 AM 02/25/2023    9:34 AM 02/22/2022    2:00 PM  CMP  Glucose 70 - 99 mg/dL 045  409  811   BUN 8 - 23 mg/dL 17  15  18    Creatinine 0.44 - 1.00 mg/dL 9.14  7.82  9.56   Sodium 135 - 145 mmol/L 137  140  138   Potassium 3.5 - 5.1 mmol/L 3.8  3.8  3.6   Chloride 98 - 111 mmol/L 104  103  105   CO2 22 - 32 mmol/L 24  25  24    Calcium 8.9 - 10.3 mg/dL 9.3  9.2  9.2   Total Protein 6.5 - 8.1 g/dL 6.8  7.3  7.9   Total Bilirubin 0.0 - 1.2 mg/dL 1.2  1.3  0.9   Alkaline Phos 38 - 126 U/L 34  35  38   AST 15 - 41 U/L 17  16  18    ALT 0 - 44 U/L 16  16  21       No results found for: "CEA1", "CEA" / No results  found for: "CEA1", "CEA" No results found for: "PSA1" No results found for: "CAN199" No results found for: "CAN125"  Lab Results  Component Value Date   TOTALPROTELP 7.3 02/22/2022   TOTALPROTELP 7.2 02/22/2022   ALBUMINELP 4.0 02/22/2022   A1GS 0.2 02/22/2022   A2GS 0.5 02/22/2022   BETS 1.1 02/22/2022   GAMS 1.4 02/22/2022   MSPIKE Not Observed 02/22/2022   SPEI Comment 02/22/2022   Lab Results  Component Value Date   TIBC 314 06/02/2023   TIBC 334 02/25/2023   TIBC 327 11/19/2022   FERRITIN 246 06/02/2023   FERRITIN 355 (H) 02/25/2023   FERRITIN 378 (H) 11/19/2022   IRONPCTSAT 62 (H) 06/02/2023   IRONPCTSAT 54 (H) 02/25/2023   IRONPCTSAT 31 11/19/2022   Lab Results  Component Value Date   LDH 117 06/02/2023   LDH 108 02/25/2023   LDH 118 11/19/2022   Bone Marrow Biopsy 04/15/22:  DIAGNOSIS:   BONE MARROW,  ASPIRATE, CLOT, CORE:  -Hypercellular bone marrow with dyspoietic changes  -Slight polyclonal plasmacytosis  -See comment   PERIPHERAL BLOOD:  -Macrocytic anemia  -Mild neutrophilic left shift   COMMENT:  The bone marrow is hypercellular for age with dyspoietic changes associated with abundant ring sideroblasts.  No increase in blastic cells identified.  The findings are very concerning for involvement by a low-grade myelodysplastic syndrome with ring sideroblasts.  The  differential diagnosis includes secondary changes related to lead toxicity, pyridoxine deficiency, alcohol, medication, etc.  Correlation with cytogenetic and FISH studies is recommended.   STUDIES:   No results found.

## 2023-06-21 DIAGNOSIS — D225 Melanocytic nevi of trunk: Secondary | ICD-10-CM | POA: Diagnosis not present

## 2023-06-21 DIAGNOSIS — L820 Inflamed seborrheic keratosis: Secondary | ICD-10-CM | POA: Diagnosis not present

## 2023-06-21 DIAGNOSIS — Z1283 Encounter for screening for malignant neoplasm of skin: Secondary | ICD-10-CM | POA: Diagnosis not present

## 2023-07-15 DIAGNOSIS — E039 Hypothyroidism, unspecified: Secondary | ICD-10-CM | POA: Diagnosis not present

## 2023-07-15 DIAGNOSIS — M810 Age-related osteoporosis without current pathological fracture: Secondary | ICD-10-CM | POA: Diagnosis not present

## 2023-07-15 DIAGNOSIS — E782 Mixed hyperlipidemia: Secondary | ICD-10-CM | POA: Diagnosis not present

## 2023-07-15 DIAGNOSIS — D509 Iron deficiency anemia, unspecified: Secondary | ICD-10-CM | POA: Diagnosis not present

## 2023-07-16 LAB — LAB REPORT - SCANNED
A1c: 6.4
EGFR: 64

## 2023-07-21 ENCOUNTER — Inpatient Hospital Stay: Attending: Hematology

## 2023-07-21 DIAGNOSIS — D469 Myelodysplastic syndrome, unspecified: Secondary | ICD-10-CM | POA: Insufficient documentation

## 2023-07-21 DIAGNOSIS — R790 Abnormal level of blood mineral: Secondary | ICD-10-CM | POA: Diagnosis not present

## 2023-07-21 DIAGNOSIS — E669 Obesity, unspecified: Secondary | ICD-10-CM | POA: Diagnosis not present

## 2023-07-21 DIAGNOSIS — I6529 Occlusion and stenosis of unspecified carotid artery: Secondary | ICD-10-CM | POA: Diagnosis not present

## 2023-07-21 DIAGNOSIS — E039 Hypothyroidism, unspecified: Secondary | ICD-10-CM | POA: Diagnosis not present

## 2023-07-21 DIAGNOSIS — D539 Nutritional anemia, unspecified: Secondary | ICD-10-CM

## 2023-07-21 DIAGNOSIS — M8580 Other specified disorders of bone density and structure, unspecified site: Secondary | ICD-10-CM | POA: Diagnosis not present

## 2023-07-21 DIAGNOSIS — E1122 Type 2 diabetes mellitus with diabetic chronic kidney disease: Secondary | ICD-10-CM | POA: Diagnosis not present

## 2023-07-21 DIAGNOSIS — C946 Myelodysplastic disease, not classified: Secondary | ICD-10-CM | POA: Diagnosis not present

## 2023-07-21 DIAGNOSIS — Z23 Encounter for immunization: Secondary | ICD-10-CM | POA: Diagnosis not present

## 2023-07-21 DIAGNOSIS — Z0000 Encounter for general adult medical examination without abnormal findings: Secondary | ICD-10-CM | POA: Diagnosis not present

## 2023-07-21 LAB — LACTATE DEHYDROGENASE: LDH: 115 U/L (ref 98–192)

## 2023-07-28 ENCOUNTER — Inpatient Hospital Stay: Admitting: Oncology

## 2023-07-28 DIAGNOSIS — D469 Myelodysplastic syndrome, unspecified: Secondary | ICD-10-CM

## 2023-07-28 NOTE — Progress Notes (Signed)
 Virtual Visit via Telephone Note  I connected with Kelly Cook on 07/28/23 at  2:00 PM EDT by telephone and verified that I am speaking with the correct person using two identifiers.  Location: Patient: Home Provider: Clinic   I discussed the limitations, risks, security and privacy concerns of performing an evaluation and management service by telephone and the availability of in person appointments. I also discussed with the patient that there may be a patient responsible charge related to this service. The patient expressed understanding and agreed to proceed.   History of Present Illness: Kelly Cook is a 83 year old female who is followed by hematology/oncology for MDS with ring sider blast.  She was last seen in our clinic on 06/09/2023.  At her last visit, her hemoglobin had started to trend down and was 9.2 from 10.4.  Previously, discussed starting treatment with Luteracept should her labs start to deviate from baseline or remain consistently below 10.  She here to discuss most recent lab draw.  Reports she had labs drawn for Dr. Milford office on 07/15/2023 by [facility].  Reports episodes of feeling dizzy over the past 6 hemoglobin had denies any bleeding.  Energy levels are 40% and appetite is 100%.  No pain.  Still having trouble sleeping.  Has intermittent diarrhea but this is chronic for her.    Observations/Objective:Review of Systems  Constitutional:  Positive for malaise/fatigue.  Gastrointestinal:  Positive for diarrhea.  Psychiatric/Behavioral:  The patient has insomnia.    Physical Exam  Neurological:     Mental Status: She is alert and oriented to person, place, and time.     Assessment and Plan: 1. MDS (myelodysplastic syndrome) (HCC) (Primary) -Had bone marrow biopsy on 04/15/2022 which showed hypercellular dyspoietic changes associated with ring sideroblasts more than 15% of erythroid precursors.  No increase in blasts noted.  No mono clonal B-cell population.  No  increase in plasma cells. - NGS: SF3B1 - MDS FISH: Normal.  Cytogenetics: 46, XX[20] -She is currently not on treatment.  We have been monitoring her labs about every 3 to 4 months. -She is here for repeat lab draw given drop in hemoglobin about 6 weeks ago. -Labs from 07/15/2023 show hemoglobin of 9.6 (9.2),.  White blood cell count is WNL. -She is hoping to avoid starting luspatercept  at this time. -Recommend follow-up in 3 months with labs a few days before and office visit.  Follow Up Instructions: Return to clinic in 3 months with labs a few days before and an office visit.   I discussed the assessment and treatment plan with the patient. The patient was provided an opportunity to ask questions and all were answered. The patient agreed with the plan and demonstrated an understanding of the instructions.   The patient was advised to call back or seek an in-person evaluation if the symptoms worsen or if the condition fails to improve as anticipated.  I provided 20 minutes of non-face-to-face time during this encounter.   Delon FORBES Hope, NP

## 2023-08-15 ENCOUNTER — Encounter: Payer: Self-pay | Admitting: Family Medicine

## 2023-08-15 ENCOUNTER — Ambulatory Visit: Admitting: Family Medicine

## 2023-08-15 ENCOUNTER — Other Ambulatory Visit: Payer: Self-pay

## 2023-08-15 VITALS — BP 128/73 | HR 79 | Temp 97.9°F | Ht 61.5 in | Wt 154.0 lb

## 2023-08-15 DIAGNOSIS — L304 Erythema intertrigo: Secondary | ICD-10-CM | POA: Diagnosis not present

## 2023-08-15 DIAGNOSIS — E039 Hypothyroidism, unspecified: Secondary | ICD-10-CM

## 2023-08-15 DIAGNOSIS — Z7984 Long term (current) use of oral hypoglycemic drugs: Secondary | ICD-10-CM

## 2023-08-15 DIAGNOSIS — E119 Type 2 diabetes mellitus without complications: Secondary | ICD-10-CM

## 2023-08-15 DIAGNOSIS — M8580 Other specified disorders of bone density and structure, unspecified site: Secondary | ICD-10-CM | POA: Insufficient documentation

## 2023-08-15 MED ORDER — LANCET DEVICE MISC: 1.0000 | Freq: Every day | 1 refills | Status: AC

## 2023-08-15 MED ORDER — BLOOD GLUCOSE TEST VI STRP
1.0000 | ORAL_STRIP | Freq: Every day | 1 refills | Status: AC
Start: 2023-08-15 — End: ?

## 2023-08-15 MED ORDER — BLOOD GLUCOSE MONITORING SUPPL DEVI
1.0000 | Freq: Every day | 1 refills | Status: AC
Start: 2023-08-15 — End: ?

## 2023-08-15 MED ORDER — FLUCONAZOLE 150 MG PO TABS: 150.0000 mg | ORAL_TABLET | ORAL | 0 refills | Status: DC

## 2023-08-15 MED ORDER — LANCETS MISC. MISC: 1.0000 | Freq: Every day | 1 refills | Status: AC

## 2023-08-15 MED ORDER — FLUCONAZOLE 150 MG PO TABS
150.0000 mg | ORAL_TABLET | ORAL | 0 refills | Status: DC
Start: 2023-08-15 — End: 2023-09-27

## 2023-08-15 MED ORDER — MICONAZOLE 2 % EX POWD: 1.0000 | Freq: Two times a day (BID) | CUTANEOUS | 1 refills | Status: DC

## 2023-08-15 NOTE — Assessment & Plan Note (Signed)
 Suspect that this is well-controlled.  Need labs/records from PCP.  Form filled out by the patient so that we can receive records.  Continue metformin.

## 2023-08-15 NOTE — Assessment & Plan Note (Signed)
 Treating with miconazole  powder and oral Diflucan .

## 2023-08-15 NOTE — Progress Notes (Signed)
 Subjective:  Patient ID: Kelly Cook, female    DOB: [birth year]  Age: 83 y.o. MRN: [id number]  CC:   Chief Complaint  Patient presents with   rash under left breast     2 weeks   Diabetes    Meter and supplies   scan of left side neck artery yearly    HPI:  83 year old female with hypothyroidism, type 2 diabetes, osteopenia, myelodysplastic syndrome presents to establish care.  Patient states that overall she is doing well.  She does note that she has had a rash underneath her left breast for the past 2 weeks.  She states it is red and irritated.  She has tried Neosporin and cornstarch powder without relief.  Patient states that her primary care provider has recently checked her A1c.  These records are not available.  She is currently on metformin.  Also do not have any labs in regards to her hypothyroidism.  She is currently on levothyroxine 100 mcg daily.  Patient follows closely with hematology/oncology regarding myelodysplastic syndrome.  Most recent hemoglobin 9.2.  There has been discussion of starting medication but patient has not wanted to do this.  Need records from her previous primary care provider regarding labs as well as vaccinations.  Patient Active Problem List   Diagnosis Date Noted   Osteopenia 08/15/2023   Intertrigo 08/15/2023   S/P hysterectomy 04/07/2023   Vaginal atrophy 04/07/2023   MDS (myelodysplastic syndrome) (HCC) 04/21/2022   Urinary incontinence 07/02/2015   Hypothyroid 06/16/2012   Diabetes (HCC) 06/16/2012    Social Hx   Social History   Socioeconomic History   Marital status: Widowed    Spouse name: Not on file   Number of children: Not on file   Years of education: Not on file   Highest education level: Not on file  Occupational History   Not on file  Tobacco Use   Smoking status: Former    Types: Cigarettes   Smokeless tobacco: Never  Vaping Use   Vaping status: Never Used  Substance and Sexual Activity   Alcohol  use: No   Drug use: No   Sexual activity: Not Currently    Birth control/protection: Surgical    Comment: hyst  Other Topics Concern   Not on file  Social History Narrative   Not on file   Social Drivers of Health   Financial Resource Strain: Low Risk  (04/07/2023)   Overall Financial Resource Strain (CARDIA)    Difficulty of Paying Living Expenses: Not very hard  Food Insecurity: No Food Insecurity (04/07/2023)   Hunger Vital Sign    Worried About Running Out of Food in the Last Year: Never true    Ran Out of Food in the Last Year: Never true  Transportation Needs: No Transportation Needs (02/22/2022)   PRAPARE - Administrator, Civil Service (Medical): No    Lack of Transportation (Non-Medical): No  Physical Activity: Insufficiently Active (04/07/2023)   Exercise Vital Sign    Days of Exercise per Week: 2 days    Minutes of Exercise per Session: 40 min  Stress: Stress Concern Present (04/07/2023)   [employer] of Occupational Health - Occupational Stress Questionnaire    Feeling of Stress : To some extent  Social Connections: Moderately Integrated (04/07/2023)   Social Connection and Isolation Panel    Frequency of Communication with Friends and Family: More than three times a week    Frequency of Social Gatherings with Friends and Family:  Twice a week    Attends Religious Services: More than 4 times per year    Active Member of Clubs or Organizations: Yes    Attends Banker Meetings: More than 4 times per year    Marital Status: Widowed    Review of Systems Per HPI  Objective:  BP 128/73   Pulse 79   Temp 97.9 F (36.6 C)   Ht 5' 1.5 (1.562 m)   Wt 154 lb (69.9 kg)   SpO2 98%   BMI 28.63 kg/m      08/15/2023   10:19 AM 06/09/2023   10:03 AM 04/25/2023   10:39 AM  BP/Weight  Systolic BP 128 144 146  Diastolic BP 73 61 80  Wt. (Lbs) 154 153   BMI 28.63 kg/m2 28.44 kg/m2     Physical Exam Vitals and nursing note reviewed. Exam  conducted with a chaperone present.  Constitutional:      General: She is not in acute distress.    Appearance: Normal appearance.  HENT:     Head: Normocephalic and atraumatic.  Eyes:     General:        Right eye: No discharge.        Left eye: No discharge.     Conjunctiva/sclera: Conjunctivae normal.  Cardiovascular:     Rate and Rhythm: Normal rate and regular rhythm.  Pulmonary:     Effort: Pulmonary effort is normal.     Breath sounds: Normal breath sounds. No wheezing, rhonchi or rales.  Chest:     Comments: Chaperone present - Brooke CMA. Erythema noted underneath the left breast. Neurological:     Mental Status: She is alert.  Psychiatric:        Mood and Affect: Mood normal.        Behavior: Behavior normal.     Lab Results  Component Value Date   WBC 6.1 06/02/2023   HGB 9.2 (L) 06/02/2023   HCT 28.8 (L) 06/02/2023   PLT 261 06/02/2023   GLUCOSE 176 (H) 06/02/2023   ALT 16 06/02/2023   AST 17 06/02/2023   NA 137 06/02/2023   K 3.8 06/02/2023   CL 104 06/02/2023   CREATININE 0.80 06/02/2023   BUN 17 06/02/2023   CO2 24 06/02/2023   HGBA1C 6.5 (H) 06/18/2019     Assessment & Plan:  Intertrigo Assessment & Plan: Treating with miconazole  powder and oral Diflucan .  Orders: -     Fluconazole ; Take 1 tablet (150 mg total) by mouth once a week. Repeat dose in 72 hours.  Dispense: 4 tablet; Refill: 0 -     Miconazole ; Apply 1 Application topically in the morning and at bedtime.  Dispense: 85 g; Refill: 1  Type 2 diabetes mellitus without complication, without long-term current use of insulin (HCC) Assessment & Plan: Suspect that this is well-controlled.  Need labs/records from PCP.  Form filled out by the patient so that we can receive records.  Continue metformin.  Orders: -     Blood Glucose Monitoring Suppl; 1 Device by Other route daily before breakfast. May substitute to any manufacturer covered by patient's insurance  Dispense: 1 each; Refill: 1 -      Blood Glucose Test; 1 each by Other route daily before breakfast. May substitute to any manufacturer covered by patient's insurance.  Dispense: 100 strip; Refill: 1 -     Lancet Device; 1 each by Does not apply route daily before breakfast. May substitute to any manufacturer covered  by patient's insurance.  Dispense: 1 each; Refill: 1 -     Lancets Misc.; 1 each by Does not apply route daily before breakfast. May substitute to any manufacturer covered by patient's insurance.  Dispense: 100 each; Refill: 1  Hypothyroidism, unspecified type Assessment & Plan: Need labs from patient's previous PCP.  Continue current dosing of Levothyroxine.     Follow-up:  6 months  Stephaney Steven Bluford DO [facility]

## 2023-08-15 NOTE — Addendum Note (Signed)
 Addended by: BLUFORD JACQULYN MATSU on: 08/15/2023 01:11 PM   Modules accepted: Orders

## 2023-08-15 NOTE — Assessment & Plan Note (Addendum)
 Need labs from patient's previous PCP.  Continue current dosing of Levothyroxine.

## 2023-08-15 NOTE — Patient Instructions (Signed)
 Medication as prescribed.  Follow up in 6 months.  Call/message with concerns.  Take care  Dr. Bluford

## 2023-08-23 ENCOUNTER — Telehealth: Payer: Self-pay | Admitting: Oncology

## 2023-08-23 NOTE — Telephone Encounter (Signed)
 Re: Persistent anemia  Discussed with Dr. Katragadda regarding starting luspatercept  versus holding off.  Patient would like to hold off if possible.  Dr. Rogers recommends repeating labs in 2 months unless she develops worsening fatigue or continues to drop below 9.5.   Delon Hope, NP 08/23/2023 3:43 PM

## 2023-08-24 DIAGNOSIS — M546 Pain in thoracic spine: Secondary | ICD-10-CM | POA: Diagnosis not present

## 2023-08-24 DIAGNOSIS — M62830 Muscle spasm of back: Secondary | ICD-10-CM | POA: Diagnosis not present

## 2023-08-24 DIAGNOSIS — M9903 Segmental and somatic dysfunction of lumbar region: Secondary | ICD-10-CM | POA: Diagnosis not present

## 2023-08-24 DIAGNOSIS — M9905 Segmental and somatic dysfunction of pelvic region: Secondary | ICD-10-CM | POA: Diagnosis not present

## 2023-08-24 DIAGNOSIS — M9902 Segmental and somatic dysfunction of thoracic region: Secondary | ICD-10-CM | POA: Diagnosis not present

## 2023-08-29 DIAGNOSIS — M9902 Segmental and somatic dysfunction of thoracic region: Secondary | ICD-10-CM | POA: Diagnosis not present

## 2023-08-29 DIAGNOSIS — M9905 Segmental and somatic dysfunction of pelvic region: Secondary | ICD-10-CM | POA: Diagnosis not present

## 2023-08-29 DIAGNOSIS — M62830 Muscle spasm of back: Secondary | ICD-10-CM | POA: Diagnosis not present

## 2023-08-29 DIAGNOSIS — M9903 Segmental and somatic dysfunction of lumbar region: Secondary | ICD-10-CM | POA: Diagnosis not present

## 2023-08-29 DIAGNOSIS — M546 Pain in thoracic spine: Secondary | ICD-10-CM | POA: Diagnosis not present

## 2023-08-30 ENCOUNTER — Other Ambulatory Visit: Payer: Self-pay | Admitting: Family Medicine

## 2023-08-30 MED ORDER — METFORMIN HCL ER 500 MG PO TB24: 500.0000 mg | ORAL_TABLET | Freq: Every evening | ORAL | 2 refills | Status: AC

## 2023-08-30 MED ORDER — LEVOTHYROXINE SODIUM 100 MCG PO TABS: 100.0000 ug | ORAL_TABLET | Freq: Every day | ORAL | 2 refills | Status: DC

## 2023-08-30 NOTE — Telephone Encounter (Signed)
 Copied from CRM #[id number]. Topic: Clinical - Medication Refill >> Aug 30, 2023 11:03 AM Fonda T wrote: Medication: levothyroxine  (SYNTHROID ) 100 MCG tablet  metFORMIN  ER (GLUCOPHAGE -XR) 500 MG 24 hr tablet   Has the patient contacted their pharmacy? Yes, per pharmacy advised to contact office to have current provider prescribe above medications.    This is the patient's preferred pharmacy:  [id number] - [place], [place] - [address] AT SEC [address] [place] [address] Phone: [phone] Fax: [phone]  Is this the correct pharmacy for this prescription? Yes If no, delete pharmacy and type the correct one.   Has the prescription been filled recently? Yes  Is the patient out of the medication? No  Has the patient been seen for an appointment in the last year OR does the patient have an upcoming appointment? Yes  Can we respond through MyChart? No, patient prefers a phone call if needed to contact  Agent: Please be advised that Rx refills may take up to 3 business days. We ask that you follow-up with your pharmacy.

## 2023-08-31 DIAGNOSIS — M62830 Muscle spasm of back: Secondary | ICD-10-CM | POA: Diagnosis not present

## 2023-08-31 DIAGNOSIS — M9903 Segmental and somatic dysfunction of lumbar region: Secondary | ICD-10-CM | POA: Diagnosis not present

## 2023-08-31 DIAGNOSIS — M9905 Segmental and somatic dysfunction of pelvic region: Secondary | ICD-10-CM | POA: Diagnosis not present

## 2023-08-31 DIAGNOSIS — M546 Pain in thoracic spine: Secondary | ICD-10-CM | POA: Diagnosis not present

## 2023-08-31 DIAGNOSIS — M9902 Segmental and somatic dysfunction of thoracic region: Secondary | ICD-10-CM | POA: Diagnosis not present

## 2023-09-07 DIAGNOSIS — M9905 Segmental and somatic dysfunction of pelvic region: Secondary | ICD-10-CM | POA: Diagnosis not present

## 2023-09-07 DIAGNOSIS — M62830 Muscle spasm of back: Secondary | ICD-10-CM | POA: Diagnosis not present

## 2023-09-07 DIAGNOSIS — M9902 Segmental and somatic dysfunction of thoracic region: Secondary | ICD-10-CM | POA: Diagnosis not present

## 2023-09-07 DIAGNOSIS — M9903 Segmental and somatic dysfunction of lumbar region: Secondary | ICD-10-CM | POA: Diagnosis not present

## 2023-09-07 DIAGNOSIS — M546 Pain in thoracic spine: Secondary | ICD-10-CM | POA: Diagnosis not present

## 2023-09-26 ENCOUNTER — Other Ambulatory Visit: Payer: Self-pay

## 2023-09-26 ENCOUNTER — Encounter (HOSPITAL_COMMUNITY): Payer: Self-pay | Admitting: *Deleted

## 2023-09-26 ENCOUNTER — Observation Stay (HOSPITAL_COMMUNITY)
Admission: EM | Admit: 2023-09-26 | Discharge: 2023-09-27 | Disposition: A | Discharge status: Home / Self Care | Source: Ambulatory Visit | Attending: Emergency Medicine | Admitting: Emergency Medicine

## 2023-09-26 ENCOUNTER — Ambulatory Visit: Payer: Self-pay

## 2023-09-26 DIAGNOSIS — D638 Anemia in other chronic diseases classified elsewhere: Secondary | ICD-10-CM | POA: Insufficient documentation

## 2023-09-26 DIAGNOSIS — I4892 Unspecified atrial flutter: Secondary | ICD-10-CM | POA: Diagnosis not present

## 2023-09-26 DIAGNOSIS — D649 Anemia, unspecified: Secondary | ICD-10-CM | POA: Insufficient documentation

## 2023-09-26 DIAGNOSIS — E039 Hypothyroidism, unspecified: Secondary | ICD-10-CM | POA: Diagnosis not present

## 2023-09-26 DIAGNOSIS — N189 Chronic kidney disease, unspecified: Secondary | ICD-10-CM | POA: Insufficient documentation

## 2023-09-26 DIAGNOSIS — Z7982 Long term (current) use of aspirin: Secondary | ICD-10-CM | POA: Insufficient documentation

## 2023-09-26 DIAGNOSIS — Z87891 Personal history of nicotine dependence: Secondary | ICD-10-CM | POA: Insufficient documentation

## 2023-09-26 DIAGNOSIS — E559 Vitamin D deficiency, unspecified: Secondary | ICD-10-CM | POA: Diagnosis not present

## 2023-09-26 DIAGNOSIS — D469 Myelodysplastic syndrome, unspecified: Secondary | ICD-10-CM | POA: Diagnosis present

## 2023-09-26 DIAGNOSIS — I6529 Occlusion and stenosis of unspecified carotid artery: Secondary | ICD-10-CM | POA: Insufficient documentation

## 2023-09-26 DIAGNOSIS — E119 Type 2 diabetes mellitus without complications: Secondary | ICD-10-CM | POA: Insufficient documentation

## 2023-09-26 DIAGNOSIS — R002 Palpitations: Secondary | ICD-10-CM | POA: Diagnosis present

## 2023-09-26 DIAGNOSIS — D631 Anemia in chronic kidney disease: Secondary | ICD-10-CM | POA: Diagnosis not present

## 2023-09-26 LAB — GLUCOSE, CAPILLARY
Glucose-Capillary: 200 mg/dL — ABNORMAL HIGH (ref 70–99)
Glucose-Capillary: 142 mg/dL — ABNORMAL HIGH (ref 70–99)

## 2023-09-26 LAB — BASIC METABOLIC PANEL WITH GFR
Anion gap: 12 (ref 5–15)
BUN: 20 mg/dL (ref 8–23)
CO2: 20 mmol/L — ABNORMAL LOW (ref 22–32)
Calcium: 9 mg/dL (ref 8.9–10.3)
Chloride: 106 mmol/L (ref 98–111)
Creatinine, Ser: 1 mg/dL (ref 0.44–1.00)
GFR, Estimated: 57 mL/min — ABNORMAL LOW (ref >60)
Glucose, Bld: 173 mg/dL — ABNORMAL HIGH (ref 70–99)
Potassium: 3.5 mmol/L (ref 3.5–5.1)
Sodium: 138 mmol/L (ref 135–145)

## 2023-09-26 LAB — CBC WITH DIFFERENTIAL/PLATELET
Abs Immature Granulocytes: 0.07 K/uL (ref 0.00–0.07)
Basophils Absolute: 0.1 K/uL (ref 0.0–0.1)
Basophils Relative: 1 %
Eosinophils Absolute: 0.2 K/uL (ref 0.0–0.5)
Eosinophils Relative: 2 %
HCT: 31.4 % — ABNORMAL LOW (ref 36.0–46.0)
Hemoglobin: 10.4 g/dL — ABNORMAL LOW (ref 12.0–15.0)
Immature Granulocytes: 1 %
Lymphocytes Relative: 32 %
Lymphs Abs: 2.5 K/uL (ref 0.7–4.0)
MCH: 35.3 pg — ABNORMAL HIGH (ref 26.0–34.0)
MCHC: 33.1 g/dL (ref 30.0–36.0)
MCV: 106.4 fL — ABNORMAL HIGH (ref 80.0–100.0)
Monocytes Absolute: 0.7 K/uL (ref 0.1–1.0)
Monocytes Relative: 9 %
Neutro Abs: 4.4 K/uL (ref 1.7–7.7)
Neutrophils Relative %: 55 %
Platelets: 329 K/uL (ref 150–400)
RBC: 2.95 MIL/uL — ABNORMAL LOW (ref 3.87–5.11)
RDW: 17.4 % — ABNORMAL HIGH (ref 11.5–15.5)
WBC: 8 K/uL (ref 4.0–10.5)
nRBC: 0.9 % — ABNORMAL HIGH (ref 0.0–0.2)

## 2023-09-26 LAB — T4, FREE: Free T4: 1.13 ng/dL — ABNORMAL HIGH (ref 0.61–1.12)

## 2023-09-26 LAB — MAGNESIUM: Magnesium: 2 mg/dL (ref 1.7–2.4)

## 2023-09-26 LAB — HEMOGLOBIN A1C
Hgb A1c MFr Bld: 6.7 % — ABNORMAL HIGH (ref 4.8–5.6)
Mean Plasma Glucose: 145.59 mg/dL

## 2023-09-26 LAB — PHOSPHORUS: Phosphorus: 3.4 mg/dL (ref 2.5–4.6)

## 2023-09-26 LAB — PROTIME-INR
INR: 1.1 (ref 0.8–1.2)
Prothrombin Time: 15.1 s (ref 11.4–15.2)

## 2023-09-26 LAB — TROPONIN I (HIGH SENSITIVITY)
Troponin I (High Sensitivity): 3 ng/L (ref <18)
Troponin I (High Sensitivity): 3 ng/L (ref <18)

## 2023-09-26 MED ORDER — ONDANSETRON HCL 4 MG/2ML IJ SOLN
4.0000 mg | Freq: Four times a day (QID) | INTRAMUSCULAR | Status: DC | PRN
Start: 2023-09-26 — End: 2023-09-27

## 2023-09-26 MED ORDER — FLEET ENEMA RE ENEM: 1.0000 | ENEMA | Freq: Once | RECTAL | Status: DC | PRN

## 2023-09-26 MED ORDER — CHLORHEXIDINE GLUCONATE CLOTH 2 % EX PADS
6.0000 | MEDICATED_PAD | Freq: Every day | CUTANEOUS | Status: DC
  Administered 2023-09-27: 6 via TOPICAL

## 2023-09-26 MED ORDER — POTASSIUM CHLORIDE CRYS ER 20 MEQ PO TBCR
40.0000 meq | EXTENDED_RELEASE_TABLET | Freq: Four times a day (QID) | ORAL | Status: AC
  Administered 2023-09-26 (×2): 40 meq via ORAL
  Filled 2023-09-26 (×2): qty 2

## 2023-09-26 MED ORDER — SENNOSIDES-DOCUSATE SODIUM 8.6-50 MG PO TABS: 1.0000 | ORAL_TABLET | Freq: Every evening | ORAL | Status: DC | PRN

## 2023-09-26 MED ORDER — HYDRALAZINE HCL 20 MG/ML IJ SOLN: 10.0000 mg | INTRAMUSCULAR | Status: DC | PRN

## 2023-09-26 MED ORDER — HEPARIN (PORCINE) 25000 UT/250ML-% IV SOLN
1050.0000 [IU]/h | INTRAVENOUS | Status: DC
  Administered 2023-09-26: 900 [IU]/h via INTRAVENOUS
  Filled 2023-09-26: qty 250

## 2023-09-26 MED ORDER — HYDROMORPHONE HCL 1 MG/ML IJ SOLN: 0.5000 mg | INTRAMUSCULAR | Status: DC | PRN

## 2023-09-26 MED ORDER — ONDANSETRON HCL 4 MG PO TABS: 4.0000 mg | ORAL_TABLET | Freq: Four times a day (QID) | ORAL | Status: DC | PRN

## 2023-09-26 MED ORDER — TRAZODONE HCL 50 MG PO TABS: 25.0000 mg | ORAL_TABLET | Freq: Every evening | ORAL | Status: DC | PRN

## 2023-09-26 MED ORDER — HEPARIN SODIUM (PORCINE) 5000 UNIT/ML IJ SOLN: 5000.0000 [IU] | Freq: Three times a day (TID) | INTRAMUSCULAR | Status: DC

## 2023-09-26 MED ORDER — DILTIAZEM HCL-DEXTROSE 125-5 MG/125ML-% IV SOLN (PREMIX)
5.0000 mg/h | INTRAVENOUS | Status: DC
  Administered 2023-09-26: 5 mg/h via INTRAVENOUS
  Filled 2023-09-26: qty 125

## 2023-09-26 MED ORDER — ACETAMINOPHEN 325 MG PO TABS: 650.0000 mg | ORAL_TABLET | Freq: Four times a day (QID) | ORAL | Status: DC | PRN

## 2023-09-26 MED ORDER — SODIUM CHLORIDE 0.9 % IV SOLN: INTRAVENOUS | Status: DC

## 2023-09-26 MED ORDER — LACTATED RINGERS IV BOLUS
500.0000 mL | Freq: Once | INTRAVENOUS | Status: AC
  Administered 2023-09-26: 500 mL via INTRAVENOUS

## 2023-09-26 MED ORDER — OXYCODONE HCL 5 MG PO TABS: 5.0000 mg | ORAL_TABLET | ORAL | Status: DC | PRN

## 2023-09-26 MED ORDER — LEVOTHYROXINE SODIUM 100 MCG PO TABS
100.0000 ug | ORAL_TABLET | Freq: Every day | ORAL | Status: DC
  Administered 2023-09-27: 100 ug via ORAL
  Filled 2023-09-26: qty 1

## 2023-09-26 MED ORDER — HEPARIN BOLUS VIA INFUSION
3000.0000 [IU] | Freq: Once | INTRAVENOUS | Status: AC
  Administered 2023-09-26: 3000 [IU] via INTRAVENOUS

## 2023-09-26 MED ORDER — LEVALBUTEROL HCL 0.63 MG/3ML IN NEBU
0.6300 mg | INHALATION_SOLUTION | Freq: Four times a day (QID) | RESPIRATORY_TRACT | Status: DC | PRN
Start: 2023-09-26 — End: 2023-09-27

## 2023-09-26 MED ORDER — ACETAMINOPHEN 650 MG RE SUPP: 650.0000 mg | Freq: Four times a day (QID) | RECTAL | Status: DC | PRN

## 2023-09-26 MED ORDER — SODIUM CHLORIDE 0.9% FLUSH
3.0000 mL | Freq: Two times a day (BID) | INTRAVENOUS | Status: DC
  Administered 2023-09-26: 3 mL via INTRAVENOUS

## 2023-09-26 MED ORDER — INSULIN ASPART 100 UNIT/ML IJ SOLN
0.0000 [IU] | Freq: Three times a day (TID) | INTRAMUSCULAR | Status: DC
  Administered 2023-09-26 – 2023-09-27 (×2): 2 [IU] via SUBCUTANEOUS

## 2023-09-26 MED ORDER — SODIUM CHLORIDE 0.9% FLUSH
3.0000 mL | Freq: Two times a day (BID) | INTRAVENOUS | Status: DC
  Administered 2023-09-26 – 2023-09-27 (×2): 3 mL via INTRAVENOUS

## 2023-09-26 MED ORDER — VITAMIN D 25 MCG (1000 UNIT) PO TABS
5000.0000 [IU] | ORAL_TABLET | Freq: Every day | ORAL | Status: DC
  Administered 2023-09-26: 5000 [IU] via ORAL
  Filled 2023-09-26: qty 5

## 2023-09-26 MED ORDER — DILTIAZEM LOAD VIA INFUSION
10.0000 mg | Freq: Once | INTRAVENOUS | Status: AC
  Administered 2023-09-26: 10 mg via INTRAVENOUS
  Filled 2023-09-26: qty 10

## 2023-09-26 MED ORDER — IPRATROPIUM BROMIDE 0.02 % IN SOLN
0.5000 mg | Freq: Four times a day (QID) | RESPIRATORY_TRACT | Status: DC | PRN
Start: 2023-09-26 — End: 2023-09-27

## 2023-09-26 MED ORDER — MAGNESIUM SULFATE 2 GM/50ML IV SOLN
2.0000 g | Freq: Once | INTRAVENOUS | Status: AC
  Administered 2023-09-26: 2 g via INTRAVENOUS
  Filled 2023-09-26: qty 50

## 2023-09-26 MED ORDER — BISACODYL 5 MG PO TBEC: 5.0000 mg | DELAYED_RELEASE_TABLET | Freq: Every day | ORAL | Status: DC | PRN

## 2023-09-26 NOTE — Assessment & Plan Note (Signed)
 Continue vitamin D supplements.

## 2023-09-26 NOTE — Hospital Course (Signed)
 Kelly Cook 83 year old female with history of DM2, vitamin D  deficiency, hypothyroidism, myelodysplastic syndrome.. Presenting today with a chief complaint of new onset of palpitation.  Stating feeling that her heart is beating fast.  She has been under stress lately taking care of her daughter who is mentally disabled. No history of previous dysrhythmia.  Denies any chest pain. Denies of having any recent illnesses or change in medications.   ED evaluation: Blood pressure 127/62, pulse (!) 133, temperature 98.4 F, resp. rate 13, height 5' 1.5 (1.562 m), weight 69.4 kg, SpO2 97%.  Labs: CBC within normal limits and hemoglobin 10.4, hematocrit 31.4, MCV of 100.6, CMP within normal limits exception of CO2 of 20, glucose 173, magnesium  2.0, troponin 3, 3,  EKG: A flutter   EDP started IV heparin , IV diltiazem  drip Consult cardiology Dr. Alvan see the patient accordingly.

## 2023-09-26 NOTE — Discharge Instructions (Addendum)
Information on my medicine - ELIQUIS® (apixaban) ° °This medication education was reviewed with me or my healthcare representative as part of my discharge preparation.  The pharmacist that spoke with me during my hospital stay was:  Caree Wolpert Rhea, RPH ° °Why was Eliquis® prescribed for you? °Eliquis® was prescribed for you to reduce the risk of a blood clot forming that can cause a stroke if you have a medical condition called atrial fibrillation (a type of irregular heartbeat). ° °What do You need to know about Eliquis® ? °Take your Eliquis® TWICE DAILY - one tablet in the morning and one tablet in the evening with or without food. If you have difficulty swallowing the tablet whole please discuss with your pharmacist how to take the medication safely. ° °Take Eliquis® exactly as prescribed by your doctor and DO NOT stop taking Eliquis® without talking to the doctor who prescribed the medication.  Stopping may increase your risk of developing a stroke.  Refill your prescription before you run out. ° °After discharge, you should have regular check-up appointments with your healthcare provider that is prescribing your Eliquis®.  In the future your dose may need to be changed if your kidney function or weight changes by a significant amount or as you get older. ° °What do you do if you miss a dose? °If you miss a dose, take it as soon as you remember on the same day and resume taking twice daily.  Do not take more than one dose of ELIQUIS at the same time to make up a missed dose. ° °Important Safety Information °A possible side effect of Eliquis® is bleeding. You should call your healthcare provider right away if you experience any of the following: °  Bleeding from an injury or your nose that does not stop. °  Unusual colored urine (red or dark brown) or unusual colored stools (red or black). °  Unusual bruising for unknown reasons. °  A serious fall or if you hit your head (even if there is no  bleeding). ° °Some medicines may interact with Eliquis® and might increase your risk of bleeding or clotting while on Eliquis®. To help avoid this, consult your healthcare provider or pharmacist prior to using any new prescription or non-prescription medications, including herbals, vitamins, non-steroidal anti-inflammatory drugs (NSAIDs) and supplements. ° °This website has more information on Eliquis® (apixaban): http://www.eliquis.com/eliquis/home ° °

## 2023-09-26 NOTE — Assessment & Plan Note (Signed)
-   Will check function, TSH, free T3, T4 -Continue current home dose of Synthroid

## 2023-09-26 NOTE — Assessment & Plan Note (Signed)
 Atrial flutter with a heart rate of 133 at this point -In ED started on Cardizem  drip, will continue drip with a goal of heart rate of < 100 -In ED initiated on heparin  drip --will be continued for now -CHA2DS2-VASc of > 3, needing chronic anticoagulation on discharge  -Recycle troponin currently 3, 3 - Repeat EKG as needed - Monitor electrolytes, keeping K >4, mag> 2 - Potassium 3.5, 20 mill equivalents of potassium will be given, 2 g of magnesium  -Obtain a 2D echocardiogram - TSH

## 2023-09-26 NOTE — Assessment & Plan Note (Addendum)
-   Current blood glucose level 173, - History of diabetes mellitus type 2, on metformin  - Last A1c 6.5, checking A1c -Holding metformin , checking CBG q. ACHS with SSI coverage

## 2023-09-26 NOTE — Progress Notes (Signed)
 PHARMACY - ANTICOAGULATION CONSULT NOTE  Pharmacy Consult for heparin  Indication: atrial fibrillation  No Known Allergies  Patient Measurements: Height: 5' 1.5 (156.2 cm) Weight: 69.4 kg (153 lb) IBW/kg (Calculated) : 48.95 HEPARIN  DW (KG): 63.7  Vital Signs: Temp: 98.4 F (36.9 C) (08/25 1419) Temp Source: Oral (08/25 1419) BP: 127/62 (08/25 1500) Pulse Rate: 133 (08/25 1545)  Labs: Recent Labs    09/26/23 1445  HGB 10.4*  HCT 31.4*  PLT 329  CREATININE 1.00  TROPONINIHS 3    Estimated Creatinine Clearance: 39.8 mL/min (by C-G formula based on SCr of 1 mg/dL).   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  Patient is not on anticoagulation prior to admission.  CBC WNL  Goal of Therapy:  Heparin  level 0.3-0.7 units/ml Monitor platelets by anticoagulation protocol: Yes   Plan:  Give 3000 units bolus x 1 Start heparin  infusion at 900 units/hr Check anti-Xa level in 8 hours and daily while on heparin  Continue to monitor H&H and platelets  Elspeth Sour, PharmD Clinical Pharmacist 09/26/2023 4:14 PM

## 2023-09-26 NOTE — ED Triage Notes (Signed)
 Pt with feeling a flutter at times since Saturday.  Denies any SOB or CP. Denies hx of Afib.

## 2023-09-26 NOTE — ED Provider Notes (Signed)
 [place] EMERGENCY DEPARTMENT AT [facility] Provider Note   CSN: [id number] Arrival date & time: 09/26/23  1406     Patient presents with: No chief complaint on file.   TRUE SHACKLEFORD is a 83 y.o. female.  HPI Patient is an 83 year old female to ED today for concerns for palpitations and fast heart rate which she has noticed for the last 2 days.  Says that she has been taking care of her daughter who has mental disabilities and has been quite stressful on her and thought that this might of been the cause.  Previous medical history of mild dysplastic syndrome, hypothyroidism, diabetes.  Family history of atrial fibrillation.  Denies fever, headache, vision changes, vertigo, cough, congestion, chest pain, shortness of breath, abdominal pain, n/v/d, dysuria, LE swelling.    Prior to Admission medications   Medication Sig Start Date End Date Taking? Authorizing Provider  acetaminophen  (TYLENOL ) 500 MG tablet Take 1,000 mg by mouth every 6 (six) hours as needed for moderate pain.    Yes [provider]  aspirin  EC 81 MG tablet Take 81 mg by mouth daily. Swallow whole.   Yes [provider]  Cholecalciferol  (DIALYVITE VITAMIN D  5000) 125 MCG (5000 UT) capsule Take 5,000 Units by mouth at bedtime.   Yes [provider]  levothyroxine  (SYNTHROID ) 100 MCG tablet Take 1 tablet (100 mcg total) by mouth daily before breakfast. 08/30/23  Yes Bluford, [name], DO  metFORMIN  (GLUCOPHAGE -XR) 500 MG 24 hr tablet Take 1 tablet (500 mg total) by mouth every evening. 08/30/23  Yes Cook, [name], DO  1st Choice Lancets Ultra Thin MISC  03/14/12   [provider]  ACCU-CHEK AVIVA PLUS test strip USE TO TEST BLOOD SUGAR EVERY DAY 05/15/19   [provider]  Blood Glucose Monitoring Suppl DEVI 1 Device by Other route daily before breakfast. May substitute to any manufacturer covered by patient's insurance 08/15/23   Cook, [name], DO  estradiol (ESTRACE) 0.1  MG/GM vaginal cream Place vaginally. Dime size amount nightly 03/30/23   [provider]  fluconazole  (DIFLUCAN ) 150 MG tablet Take 1 tablet (150 mg total) by mouth once a week. Patient not taking: Reported on 09/26/2023 08/15/23   Cook, [name], DO  Glucose Blood (BLOOD GLUCOSE TEST STRIPS) STRP 1 each by Other route daily before breakfast. May substitute to any manufacturer covered by patient's insurance. 08/15/23   Cook, [name], DO  Lancet Device MISC 1 each by Does not apply route daily before breakfast. May substitute to any manufacturer covered by patient's insurance. 08/15/23   Cook, [name], DO  Lancets Misc. MISC 1 each by Does not apply route daily before breakfast. May substitute to any manufacturer covered by patient's insurance. 08/15/23   Cook, [name], DO  Miconazole  2 % POWD Apply 1 Application topically in the morning and at bedtime. Patient not taking: Reported on 09/26/2023 08/15/23   Cook, [name], DO    Allergies: Patient has no known allergies.    Review of Systems  Cardiovascular:  Positive for palpitations.  All other systems reviewed and are negative.   Updated Vital Signs BP (!) 157/94   Pulse (!) 125   Temp 98.3 F (36.8 C) (Oral)   Resp 17   Ht 5' 1.5 (1.562 m)   Wt 69.4 kg   SpO2 94%   BMI 28.44 kg/m   Physical Exam Vitals and nursing note reviewed.  Constitutional:      General:  She is not in acute distress.    Appearance: Normal appearance. She is not ill-appearing or diaphoretic.  HENT:     Head: Normocephalic and atraumatic.  Eyes:     General: No scleral icterus.       Right eye: No discharge.        Left eye: No discharge.     Extraocular Movements: Extraocular movements intact.     Conjunctiva/sclera: Conjunctivae normal.  Cardiovascular:     Rate and Rhythm: Regular rhythm. Tachycardia present.     Pulses: Normal pulses.     Heart sounds: Normal heart sounds. No murmur heard.    No friction rub. No gallop.  Pulmonary:     Effort:  Pulmonary effort is normal. No respiratory distress.     Breath sounds: No stridor. No wheezing, rhonchi or rales.  Chest:     Chest wall: No tenderness.  Abdominal:     General: Abdomen is flat. There is no distension.     Palpations: Abdomen is soft.     Tenderness: There is no abdominal tenderness. There is no right CVA tenderness, left CVA tenderness, guarding or rebound.  Musculoskeletal:        General: No swelling, deformity or signs of injury.     Cervical back: Normal range of motion. No rigidity.     Right lower leg: No edema.     Left lower leg: No edema.  Skin:    General: Skin is warm and dry.     Findings: No bruising, erythema or lesion.  Neurological:     General: No focal deficit present.     Mental Status: She is alert and oriented to person, place, and time. Mental status is at baseline.     Sensory: No sensory deficit.     Motor: No weakness.  Psychiatric:        Mood and Affect: Mood normal.     (all labs ordered are listed, but only abnormal results are displayed) Labs Reviewed  CBC WITH DIFFERENTIAL/PLATELET - Abnormal; Notable for the following components:      Result Value   RBC 2.95 (*)    Hemoglobin 10.4 (*)    HCT 31.4 (*)    MCV 106.4 (*)    MCH 35.3 (*)    RDW 17.4 (*)    nRBC 0.9 (*)    All other components within normal limits  BASIC METABOLIC PANEL WITH GFR - Abnormal; Notable for the following components:   CO2 20 (*)    Glucose, Bld 173 (*)    GFR, Estimated 57 (*)    All other components within normal limits  EXPECTORATED SPUTUM ASSESSMENT W GRAM STAIN, RFLX TO RESP C  MAGNESIUM   HEPARIN  LEVEL (UNFRACTIONATED)  PHOSPHORUS  PROTIME-INR  HEMOGLOBIN A1C  HEMOGLOBIN A1C  TSH  CBC  BASIC METABOLIC PANEL WITH GFR  PROTIME-INR  APTT  TROPONIN I (HIGH SENSITIVITY)  TROPONIN I (HIGH SENSITIVITY)    EKG: None  Radiology: No results found.  .Critical Care  Performed by: Beola Terrall RAMAN, PA-C Authorized by: Beola Terrall RAMAN,  PA-C   Critical care provider statement:    Critical care time (minutes):  44   Critical care time was exclusive of:  Separately billable procedures and treating other patients   Critical care was necessary to treat or prevent imminent or life-threatening deterioration of the following conditions:  Cardiac failure   Critical care was time spent personally by me on the following activities:  Development of treatment  plan with patient or surrogate, discussions with consultants, evaluation of patient's response to treatment, examination of patient, ordering and review of laboratory studies, ordering and review of radiographic studies, ordering and performing treatments and interventions, pulse oximetry, re-evaluation of patient's condition, review of old charts and obtaining history from patient or surrogate   I assumed direction of critical care for this patient from another provider in my specialty: yes     Care discussed with: admitting provider      Medications Ordered in the ED  diltiazem  (CARDIZEM ) 1 mg/mL load via infusion 10 mg (10 mg Intravenous Bolus from Bag 09/26/23 1613)    And  diltiazem  (CARDIZEM ) 125 mg in dextrose  5% 125 mL (1 mg/mL) infusion (7.5 mg/hr Intravenous Rate/Dose Change 09/26/23 1702)  potassium chloride  SA (KLOR-CON  M) CR tablet 40 mEq (40 mEq Oral Given 09/26/23 1629)  heparin  ADULT infusion 100 units/mL (25000 units/250mL) (900 Units/hr Intravenous New Bag/Given 09/26/23 1635)  sodium chloride  flush (NS) 0.9 % injection 3 mL (has no administration in time range)  0.9 %  sodium chloride  infusion (has no administration in time range)  sodium chloride  flush (NS) 0.9 % injection 3 mL (has no administration in time range)  acetaminophen  (TYLENOL ) tablet 650 mg (has no administration in time range)    Or  acetaminophen  (TYLENOL ) suppository 650 mg (has no administration in time range)  oxyCODONE  (Oxy IR/ROXICODONE ) immediate release tablet 5 mg (has no administration in time  range)  HYDROmorphone  (DILAUDID ) injection 0.5-1 mg (has no administration in time range)  traZODone  (DESYREL ) tablet 25 mg (has no administration in time range)  senna-docusate (Senokot-S) tablet 1 tablet (has no administration in time range)  bisacodyl  (DULCOLAX) EC tablet 5 mg (has no administration in time range)  sodium phosphate  (FLEET) enema 1 enema (has no administration in time range)  ondansetron  (ZOFRAN ) tablet 4 mg (has no administration in time range)    Or  ondansetron  (ZOFRAN ) injection 4 mg (has no administration in time range)  ipratropium (ATROVENT ) nebulizer solution 0.5 mg (has no administration in time range)  levalbuterol  (XOPENEX ) nebulizer solution 0.63 mg (has no administration in time range)  hydrALAZINE  (APRESOLINE ) injection 10 mg (has no administration in time range)  magnesium  sulfate IVPB 2 g 50 mL (has no administration in time range)  insulin  aspart (novoLOG ) injection 0-9 Units (has no administration in time range)  lactated ringers  bolus 500 mL (0 mLs Intravenous Stopped 09/26/23 1631)  heparin  bolus via infusion 3,000 Units (3,000 Units Intravenous Bolus from Bag 09/26/23 1635)    Clinical Course as of 09/26/23 1705  Mon Sep 26, 2023  1612 Spoke with cardiology, Dr. Alvan, who agreed to have her continued on Cardizem  as well as heparin .  That they will round on her and that in the meantime she can be admitted to medicine. [CB]    Clinical Course User Index [CB] Beola Terrall RAMAN, PA-C    Medical Decision Making  This patient is a 83 year old female who presents to the ED for concern of tachycardia, with no previous cardiac history noted.  Currently asymptomatic outside of heart rate of 130s to 140s.  Notably has a history of MDS and diabetes and hypothyroidism but otherwise has not had any previous history of A-fib, a flutter and is not taking any blood thinners.  On physical exam, patient is in no acute distress, afebrile, alert and orient x 4, speaking  in full sentences, nontachypneic.  Patient had to be tachycardic with heart rate of 130s to  140s, regular irregular.  Suspecting likely atrial flutter as cause of symptoms.  LCTAB, no lower leg edema.  Patient initially was able to vagal into sinus rhythm however after monitoring with labs pending approximately 1 hour and she returned to being tachycardic.  Patient labs were drawn, with labs seem to be near baseline for patient, with negative troponin.    Started her on diltiazem  and heparin  per pharmacy, consulted with cardiology and spoke with Dr. Alvan who recommend that continue to keep her on diltiazem  drip as well as to continue with heparin  and they will see her for new onset a flutter versus A-fib.  Patient care then transferred over to Dr. Willette.  Differential diagnoses prior to evaluation: The emergent differential diagnosis includes, but is not limited to, arrhythmia. This is not an exhaustive differential.   Past Medical History / Co-morbidities / Social History: Diabetes, hypothyroid, myodysplastic syndrome  Additional history: Chart reviewed. Pertinent results include:  Last saw oncology on 07/28/2023 for MDS, currently undergoing treatment and he is continuing with monitoring.   Lab Tests/Imaging studies: I personally interpreted labs/imaging and the pertinent results include:    CBC notes baseline hemoglobin of 10.4 BMP shows decreased UMA 7 Troponin unremarkable.  Cardiac monitoring: EKG obtained and interpreted by myself and attending physician which shows: Sinus tachycardia     Medications: I ordered medication including diltiazem , heparin  bolus infusion, LR.  I have reviewed the patients home medicines and have made adjustments as needed.  Critical Interventions:  Social Determinants of Health:  Disposition: After consideration of the diagnostic results and the patients response to treatment, I feel that the patient would benefit from admission, patient  care being transferred over to Dr. Twana.    Final diagnoses:  Atrial flutter, unspecified type [facility])    ED Discharge Orders     None          Beola Terrall GORMAN DEVONNA 09/26/23 1706    Melvenia Motto, MD 09/26/23 2145

## 2023-09-26 NOTE — Assessment & Plan Note (Signed)
-   Stable -To follow-up with primary oncologist -Monitoring closely with CBC, on heparin  drip

## 2023-09-26 NOTE — Assessment & Plan Note (Signed)
 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

## 2023-09-26 NOTE — Telephone Encounter (Signed)
 FYI Only or Action Required?: FYI only for provider.  Patient was last seen in primary care on 08/15/2023 by Cook, [name], DO.  Called Nurse Triage reporting Tachycardia.  Symptoms began 3 days ago.  Interventions attempted: Nothing.  Symptoms are: unchanged.  Triage Disposition: Go to ED Now (Notify PCP)  Patient/caregiver understands and will follow disposition?: Yes           Copied from CRM [id number]. Topic: Clinical - Red Word Triage >> Sep 26, 2023  1:21 PM Donna BRAVO wrote: Red Word that prompted transfer to Nurse Triage: patient  heart rate fluctuating between 140 and 69 this started Saturday.            Reason for Disposition  [1] Heart beating very rapidly (e.g., > 140 / minute) AND [2] present now  (Exception: During exercise.)  Answer Assessment - Initial Assessment Questions 1. DESCRIPTION: Please describe your heart rate or heartbeat that you are having (e.g., fast/slow, regular/irregular, skipped or extra beats, palpitations)     Fluctuating from 69-140 bpm  2. ONSET: When did it start? (e.g., minutes, hours, days)      3 days ago  3. DURATION: How long does it last (e.g., seconds, minutes, hours)     Minutes to hours  4. PATTERN Does it come and go, or has it been constant since it started?  Does it get worse with exertion?   Are you feeling it now?     Intermittent  6. HEART RATE: Can you tell me your heart rate? How many beats in 15 seconds?  Note: Not all patients can do this.       130's-140's bpm currently  7. RECURRENT SYMPTOM: Have you ever had this before? If Yes, ask: When was the last time? and What happened that time?      Not that she is aware of  8. CAUSE: What do you think is causing the palpitations?     Unsure  9. CARDIAC HISTORY: Do you have any history of heart disease? (e.g., heart attack, angina, bypass surgery, angioplasty, arrhythmia)      No 10. OTHER SYMPTOMS: Do you have any other symptoms?  (e.g., dizziness, chest pain, sweating, difficulty breathing)       No  Protocols used: Heart Rate and Heartbeat Questions-A-AH

## 2023-09-26 NOTE — H&P (Signed)
 History and Physical   Patient: Kelly Cook                            PCP: Cook, [name], DO                    DOB: 1940-09-11            DOA: 09/26/2023 FMW:[id number]             DOS: 09/26/2023, 5:15 PM  Cook, [name], DO  Patient coming from:   HOME  I have personally reviewed patient's medical records, in electronic medical records, including:  [facility], and care everywhere.    Chief Complaint:   Palpitation   History of present illness:    Kelly Cook 83 year old female with history of DM2, vitamin D  deficiency, hypothyroidism, myelodysplastic syndrome.. Presenting today with a chief complaint of new onset of palpitation.  Stating feeling that her heart is beating fast.  She has been under stress lately taking care of her daughter who is mentally disabled. No history of previous dysrhythmia.  Denies any chest pain. Denies of having any recent illnesses or change in medications.   ED evaluation: Blood pressure 127/62, pulse (!) 133, temperature 98.4 F, resp. rate 13, height 5' 1.5 (1.562 m), weight 69.4 kg, SpO2 97%.  Labs: CBC within normal limits and hemoglobin 10.4, hematocrit 31.4, MCV of 100.6, CMP within normal limits exception of CO2 of 20, glucose 173, magnesium  2.0, troponin 3, 3,  EKG: A flutter   EDP started IV heparin , IV diltiazem  drip Consult cardiology Dr. Alvan see the patient accordingly.    Patient Denies having: Fever, Chills, Cough, SOB, Chest Pain, Abd pain, N/V/D, headache, dizziness, lightheadedness,  Dysuria, Joint pain, rash, open wounds     Review of Systems: As per HPI, otherwise 10 point review of systems were negative.   ----------------------------------------------------------------------------------------------------------------------  No Known Allergies  Home MEDs:  Prior to Admission medications   Medication Sig Start Date End Date Taking? Authorizing Provider  acetaminophen  (TYLENOL ) 500 MG tablet Take  1,000 mg by mouth every 6 (six) hours as needed for moderate pain.    Yes [provider]  aspirin  EC 81 MG tablet Take 81 mg by mouth daily. Swallow whole.   Yes [provider]  Cholecalciferol  (DIALYVITE VITAMIN D  5000) 125 MCG (5000 UT) capsule Take 5,000 Units by mouth at bedtime.   Yes [provider]  levothyroxine  (SYNTHROID ) 100 MCG tablet Take 1 tablet (100 mcg total) by mouth daily before breakfast. 08/30/23  Yes Cook, [name], DO  metFORMIN  (GLUCOPHAGE -XR) 500 MG 24 hr tablet Take 1 tablet (500 mg total) by mouth every evening. 08/30/23  Yes Cook, [name], DO  1st Choice Lancets Ultra Thin MISC  03/14/12   [provider]  ACCU-CHEK AVIVA PLUS test strip USE TO TEST BLOOD SUGAR EVERY DAY 05/15/19   [provider]  Blood Glucose Monitoring Suppl DEVI 1 Device by Other route daily before breakfast. May substitute to any manufacturer covered by patient's insurance 08/15/23   Cook, [name], DO  estradiol (ESTRACE) 0.1 MG/GM vaginal cream Place vaginally. Dime size amount nightly 03/30/23   [provider]  fluconazole  (DIFLUCAN ) 150 MG tablet Take 1 tablet (150 mg total) by mouth once a week. Patient not taking: Reported on 09/26/2023 08/15/23   Cook, [name], DO  Glucose Blood (BLOOD GLUCOSE TEST STRIPS) STRP 1  each by Other route daily before breakfast. May substitute to any manufacturer covered by patient's insurance. 08/15/23   Cook, [name], DO  Lancet Device MISC 1 each by Does not apply route daily before breakfast. May substitute to any manufacturer covered by patient's insurance. 08/15/23   Cook, [name], DO  Lancets Misc. MISC 1 each by Does not apply route daily before breakfast. May substitute to any manufacturer covered by patient's insurance. 08/15/23   Cook, [name], DO  Miconazole  2 % POWD Apply 1 Application topically in the morning and at bedtime. 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   Coccyx feels prominent and deviated to left, will try NSAID, if not better xray   Constipation 06/16/2012   Diabetes mellitus    Gall bladder stones    Hemorrhoids    History of kidney stones    Hypothyroid 06/16/2012   Hypothyroid 06/16/2012   Osteopenia    Osteoporosis    Otitis externa 12/01/2012   Shingles    Urinary incontinence 07/02/2015    Past Surgical History:  Procedure Laterality Date   ABDOMINAL HYSTERECTOMY     dysfunctional uterine bleeding   APPENDECTOMY     CATARACT EXTRACTION W/PHACO Left 11/14/2017   Procedure: CATARACT EXTRACTION PHACO AND INTRAOCULAR LENS PLACEMENT (IOC);  Surgeon: Perley Hamilton, MD;  Location: [facility];  Service: Ophthalmology;  Laterality: Left;  CDE: 9.02   CATARACT EXTRACTION W/PHACO Right 11/28/2017   Procedure: CATARACT EXTRACTION PHACO AND INTRAOCULAR LENS PLACEMENT RIGHT EYE CDE=10.42;  Surgeon: Perley Hamilton, MD;  Location: [facility];  Service: Ophthalmology;  Laterality: Right;  right   CHOLECYSTECTOMY N/A 06/20/2019   Procedure: LAPAROSCOPIC CHOLECYSTECTOMY;  Surgeon: Kallie Manuelita BROCKS, MD;  Location: [facility];  Service: General;  Laterality: N/A;   COLONOSCOPY N/A 11/27/2015   Procedure: COLONOSCOPY;  Surgeon: Claudis RAYMOND Rivet, MD;  Location: [facility];  Service: Endoscopy;  Laterality: N/A;  730   EXTRACORPOREAL SHOCK WAVE LITHOTRIPSY Left 11/01/2016   Procedure: LEFT EXTRACORPOREAL SHOCK WAVE LITHOTRIPSY (ESWL);  Surgeon: Nieves Cough, MD;  Location: [facility];  Service: Urology;  Laterality: Left;   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 Her smoking use included cigarettes. She has never used smokeless tobacco. She reports that she does not drink alcohol and does not use drugs.   Family History  Problem Relation Age of Onset   Cancer Father    Dementia Mother    Diabetes Brother    Heart disease Brother    Mental retardation Daughter        in group home    Physical Exam:   Vitals:   09/26/23 1500 09/26/23 1540 09/26/23 1545 09/26/23 1700  BP: 127/62   (!) 157/94  Pulse: 90 92 (!) 133 (!) 125  Resp: 14 20 13 17   Temp:    98.3 F (36.8 C)  TempSrc:    Oral  SpO2: 94% 95% 97% 94%  Weight:      Height:       Constitutional: NAD, calm, comfortable Eyes: PERRL, lids and conjunctivae normal  ENMT: Mucous membranes are moist. Posterior pharynx clear of any exudate or lesions.Normal dentition.  Neck: normal, supple, no masses, no thyromegaly Respiratory: clear to auscultation bilaterally, no wheezing, no crackles. Normal respiratory effort. No accessory muscle use.  Cardiovascular: Irregularly irregular, never any murmurs rubs or gallops Abdomen: no tenderness, no masses palpated. No hepatosplenomegaly. Bowel sounds positive.  Musculoskeletal: no clubbing / cyanosis. No joint deformity upper and lower extremities. Good ROM, no contractures. Normal muscle tone.  Neurologic: CN II-XII grossly intact. Sensation intact, DTR normal. Strength 5/5 in all 4.  Psychiatric: Normal judgment and insight. Alert and oriented x 3. Normal mood.  Skin: no rashes, lesions, ulcers. No induration      Labs on admission:    I have personally reviewed following labs and imaging studies  CBC: Recent Labs  Lab 09/26/23 1445  WBC 8.0  NEUTROABS 4.4  HGB 10.4*  HCT 31.4*  MCV 106.4*  PLT 329   Basic Metabolic Panel: Recent Labs  Lab 09/26/23 1445 09/26/23 1623  NA 138  --   K 3.5  --   CL 106  --   CO2 20*  --   GLUCOSE 173*  --   BUN 20  --   CREATININE 1.00  --   CALCIUM 9.0  --   MG  --  2.0    Urine  analysis:    Component Value Date/Time   COLORURINE BIOCHEMICALS MAY BE AFFECTED BY COLOR (A) 10/24/2016 1008   APPEARANCEUR HAZY (A) 10/24/2016 1008   LABSPEC 1.013 10/24/2016 1008   PHURINE 5.0 10/24/2016 1008   GLUCOSEU NEGATIVE 10/24/2016 1008   HGBUR LARGE (A) 10/24/2016 1008   BILIRUBINUR NEGATIVE 10/24/2016 1008   KETONESUR NEGATIVE 10/24/2016 1008   PROTEINUR 100 (A) 10/24/2016 1008   UROBILINOGEN 0.2 10/16/2010 2335   NITRITE NEGATIVE 10/24/2016 1008   LEUKOCYTESUR NEGATIVE 10/24/2016 1008    Last A1C:  Lab Results  Component Value Date   HGBA1C 6.5 (H) 06/18/2019     Radiologic Exams on Admission:   No results found.  EKG:   Independently reviewed.  Orders placed or performed during the hospital encounter of 09/26/23   ED EKG   ED EKG   EKG 12-Lead   EKG 12-Lead   EKG 12-Lead   EKG 12-Lead   EKG 12-Lead   EKG 12-Lead   EKG 12-Lead   ---------------------------------------------------------------------------------------------------------------------------------------    Assessment / Plan:   Principal Problem:   Atrial flutter (HCC) Active Problems:   Hypothyroid   MDS (myelodysplastic syndrome) (HCC)   Diabetes (HCC)   Anemia of chronic disease   Vitamin D  deficiency   Assessment and Plan: * Atrial flutter (HCC) Atrial flutter with a heart rate of 133 at this point -In ED started on Cardizem  drip, will continue drip with a goal of heart rate of < 100 -In ED initiated on heparin  drip --will be continued for now -CHA2DS2-VASc of > 3, needing chronic anticoagulation on discharge  -Recycle troponin currently 3, 3 - Repeat EKG as needed - Monitor electrolytes, keeping K >4, mag> 2 - Potassium 3.5, 20 mill equivalents of potassium will be given, 2 g of magnesium  -Obtain a 2D echocardiogram - TSH   MDS (myelodysplastic syndrome) (HCC) - Stable -To follow-up with primary oncologist -Monitoring closely with CBC, on heparin   drip  Hypothyroid - Will check function, TSH, free T3, T4 -Continue current home dose of Synthroid   Anemia of chronic disease Anemia of chronic disease with a myelodysplastic syndrome Monitoring H&H closely  And iron levels, B12 and folate  Diabetes (HCC) - Current blood glucose level 173, - History of diabetes mellitus type 2, on metformin  - Last A1c 6.5, checking A1c -Holding metformin , checking CBG q. ACHS with SSI coverage  Vitamin D  deficiency Continue vitamin D  supplements      Consults called: Cardiology Dr. Alvan -------------------------------------------------------------------------------------------------------------------------------------------- DVT prophylaxis:  SCDs Start: 09/26/23 1656   Code Status:   Code Status: Full Code   Admission status: Patient will be admitted as Inpatient, with a greater than 2 midnight length of stay. Level of care: Stepdown   Family Communication:  none at bedside  (The above findings and plan of care has been discussed with patient in detail, the patient expressed understanding and agreement of above plan)  --------------------------------------------------------------------------------------------------------------------------------------------------  Disposition Plan:  Anticipated 1-2 days Status is: Inpatient Remains inpatient appropriate because: Needing IV medication of Cardizem  drip, heparin  -cardiology consult ----------------------------------------------------------------------------------------------------------  Time spent:  10  Min.  Was spent seeing and evaluating the patient, reviewing all medical records, drawn plan of care.  SIGNED: Adriana DELENA Grams, MD, FHM. FAAFP. [facility], Pager  (Please use [email or web address] to page/ or secure chat through epic) If 7PM-7AM, please contact night-coverage [email or web address],  09/26/2023, 5:15 PM

## 2023-09-27 ENCOUNTER — Other Ambulatory Visit (HOSPITAL_COMMUNITY): Payer: Self-pay

## 2023-09-27 ENCOUNTER — Inpatient Hospital Stay (HOSPITAL_BASED_OUTPATIENT_CLINIC_OR_DEPARTMENT_OTHER)

## 2023-09-27 DIAGNOSIS — E039 Hypothyroidism, unspecified: Secondary | ICD-10-CM

## 2023-09-27 DIAGNOSIS — E119 Type 2 diabetes mellitus without complications: Secondary | ICD-10-CM | POA: Diagnosis not present

## 2023-09-27 DIAGNOSIS — D469 Myelodysplastic syndrome, unspecified: Secondary | ICD-10-CM | POA: Diagnosis not present

## 2023-09-27 DIAGNOSIS — I4892 Unspecified atrial flutter: Secondary | ICD-10-CM | POA: Diagnosis not present

## 2023-09-27 DIAGNOSIS — I484 Atypical atrial flutter: Secondary | ICD-10-CM | POA: Diagnosis not present

## 2023-09-27 DIAGNOSIS — R000 Tachycardia, unspecified: Secondary | ICD-10-CM | POA: Diagnosis not present

## 2023-09-27 DIAGNOSIS — I6522 Occlusion and stenosis of left carotid artery: Secondary | ICD-10-CM

## 2023-09-27 DIAGNOSIS — Z87891 Personal history of nicotine dependence: Secondary | ICD-10-CM | POA: Diagnosis not present

## 2023-09-27 DIAGNOSIS — D631 Anemia in chronic kidney disease: Secondary | ICD-10-CM | POA: Diagnosis not present

## 2023-09-27 DIAGNOSIS — N189 Chronic kidney disease, unspecified: Secondary | ICD-10-CM | POA: Diagnosis not present

## 2023-09-27 DIAGNOSIS — E559 Vitamin D deficiency, unspecified: Secondary | ICD-10-CM | POA: Diagnosis not present

## 2023-09-27 DIAGNOSIS — Z7982 Long term (current) use of aspirin: Secondary | ICD-10-CM | POA: Diagnosis not present

## 2023-09-27 LAB — CBC
HCT: 27.9 % — ABNORMAL LOW (ref 36.0–46.0)
Hemoglobin: 9.5 g/dL — ABNORMAL LOW (ref 12.0–15.0)
MCH: 36.3 pg — ABNORMAL HIGH (ref 26.0–34.0)
MCHC: 34.1 g/dL (ref 30.0–36.0)
MCV: 106.5 fL — ABNORMAL HIGH (ref 80.0–100.0)
Platelets: 300 K/uL (ref 150–400)
RBC: 2.62 MIL/uL — ABNORMAL LOW (ref 3.87–5.11)
RDW: 17.6 % — ABNORMAL HIGH (ref 11.5–15.5)
WBC: 5.8 K/uL (ref 4.0–10.5)
nRBC: 1 % — ABNORMAL HIGH (ref 0.0–0.2)

## 2023-09-27 LAB — ECHOCARDIOGRAM COMPLETE
AR max vel: 2.89 cm2
AV Area VTI: 2.65 cm2
AV Area mean vel: 2.74 cm2
AV Mean grad: 3.6 mmHg
AV Peak grad: 6.3 mmHg
Ao pk vel: 1.25 m/s
Area-P 1/2: 4.8 cm2
Height: 62 in
S' Lateral: 3 cm
Weight: 2458.57 [oz_av]

## 2023-09-27 LAB — T3, FREE: T3, Free: 2.3 pg/mL (ref 2.0–4.4)

## 2023-09-27 LAB — BASIC METABOLIC PANEL WITH GFR
Anion gap: 10 (ref 5–15)
BUN: 16 mg/dL (ref 8–23)
CO2: 22 mmol/L (ref 22–32)
Calcium: 8.5 mg/dL — ABNORMAL LOW (ref 8.9–10.3)
Chloride: 108 mmol/L (ref 98–111)
Creatinine, Ser: 0.73 mg/dL (ref 0.44–1.00)
GFR, Estimated: >60 mL/min (ref >60)
Glucose, Bld: 143 mg/dL — ABNORMAL HIGH (ref 70–99)
Potassium: 4 mmol/L (ref 3.5–5.1)
Sodium: 140 mmol/L (ref 135–145)

## 2023-09-27 LAB — MRSA NEXT GEN BY PCR, NASAL: MRSA by PCR Next Gen: NOT DETECTED

## 2023-09-27 LAB — TSH: TSH: 1.89 u[IU]/mL (ref 0.350–4.500)

## 2023-09-27 LAB — HEPARIN LEVEL (UNFRACTIONATED): Heparin Unfractionated: 0.11 [IU]/mL — ABNORMAL LOW (ref 0.30–0.70)

## 2023-09-27 LAB — GLUCOSE, CAPILLARY
Glucose-Capillary: 156 mg/dL — ABNORMAL HIGH (ref 70–99)
Glucose-Capillary: 120 mg/dL — ABNORMAL HIGH (ref 70–99)

## 2023-09-27 LAB — PROTIME-INR
INR: 1.2 (ref 0.8–1.2)
Prothrombin Time: 15.9 s — ABNORMAL HIGH (ref 11.4–15.2)

## 2023-09-27 LAB — APTT: aPTT: 49 s — ABNORMAL HIGH (ref 24–36)

## 2023-09-27 MED ORDER — METOPROLOL TARTRATE 25 MG PO TABS: 25.0000 mg | ORAL_TABLET | Freq: Two times a day (BID) | ORAL | 2 refills | Status: DC

## 2023-09-27 MED ORDER — APIXABAN 5 MG PO TABS
5.0000 mg | ORAL_TABLET | Freq: Two times a day (BID) | ORAL | Status: DC
  Administered 2023-09-27: 5 mg via ORAL
  Filled 2023-09-27: qty 1

## 2023-09-27 MED ORDER — METOPROLOL TARTRATE 25 MG PO TABS
25.0000 mg | ORAL_TABLET | Freq: Two times a day (BID) | ORAL | Status: DC
  Administered 2023-09-27: 25 mg via ORAL
  Filled 2023-09-27: qty 1

## 2023-09-27 NOTE — Care Management Obs Status (Signed)
 MEDICARE OBSERVATION STATUS NOTIFICATION   Patient Details  Name: MASAKO OVERALL MRN: [id number] Date of Birth: 31-Jan-1941   Medicare Observation Status Notification Given:  Yes    Mcarthur Saddie Kim, LCSW 09/27/2023, 11:25 AM

## 2023-09-27 NOTE — Progress Notes (Signed)
 PT Cancellation Note  Patient Details Name: Kelly Cook MRN: [id number] DOB: 1940/12/10   Cancelled Treatment:    Reason Eval/Treat Not Completed: PT screened, no needs identified, will sign off. Patient walking independently in room, hallways and states she is at baseline.   10:46 AM, 09/27/23 Lynwood Music, MPT Physical Therapist with [facility] [phone] office [phone] mobile phone

## 2023-09-27 NOTE — Consult Note (Addendum)
 Cardiology Consultation   Patient ID: MAURIANA Cook MRN: [id number]; DOB: Mar 13, 1940  Admit date: 09/26/2023 Date of Consult: 09/27/2023  PCP:  Kelly Jacqulyn MATSU, DO   [facility] Providers Cardiologist:  Kelly Carrier, MD       Patient Profile: Kelly Cook is a 83 y.o. female with a hx of atypical chest pain (occurring in 07/2019 and not requiring further ischemic testing at that time), hypothyroidism, Type II DM, carotid artery stenosis (dopplers in 02/2023 showing 50-69% LICA stenosis) and MDS who is being seen 09/27/2023 for the evaluation of atrial flutter with RVR at the request of Dr. Willette.  History of Present Illness:  Ms. Kelly [facility] presented to [facility] on 09/26/2023 for evaluation of palpitations which had been occurring for the past few days. Reported being under increased stress and she initially thought this was the culprit of her symptoms. In talking with the patient today, she reports her daughter with mental disabilities was home over the weekend and she usually has more stress when she visits. Notes palpitations starting on Saturday and thought they were due to stress but symptoms persisted, therefore she came to the ED yesterday for evaluation. No chest pain, dyspnea on exertion, orthopnea, PND or pitting edema. Consumes 1-2 cups of coffee daily and then mostly water . No tea or sodas. No alcohol use. No new medications or OTC supplements. Unaware of any family history of cardiac arrhythmias but reports her father and brother had CAD.   Initial labs showed WBC 8.0, Hgb 10.4 (close to baseline), platelets 329, Na+ 138, K+ 3.5 and creatinine 1.00. Hs Troponin negative at 3 and 3. TSH 1.890 with Free T3 at 2.3 and Free T4 mildly elevated at 1.13. Initial EKG showed likely 2:1 atrial flutter, HR 136.  Was initially started on IV Heparin  for anticoagulation and IV Cardizem  for rate-control. Was also started on PO Lopressor  25mg  BID. Did convert back to NSR  overnight and HR has been in the 70's to 90's. She reports feeling back to baseline this morning.   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   Coccyx feels prominent and deviated to left, will try NSAID, if not better xray   Constipation 06/16/2012   Diabetes mellitus    Gall bladder stones    Hemorrhoids    History of kidney stones    Hypothyroid 06/16/2012   Hypothyroid 06/16/2012   Osteopenia    Osteoporosis    Otitis externa 12/01/2012   Shingles    Urinary incontinence 07/02/2015    Past Surgical History:  Procedure Laterality Date   ABDOMINAL HYSTERECTOMY     dysfunctional uterine bleeding   APPENDECTOMY     CATARACT EXTRACTION W/PHACO Left 11/14/2017   Procedure: CATARACT EXTRACTION PHACO AND INTRAOCULAR LENS PLACEMENT (IOC);  Surgeon: Perley Hamilton, MD;  Location: [facility];  Service: Ophthalmology;  Laterality: Left;  CDE: 9.02   CATARACT EXTRACTION W/PHACO Right 11/28/2017   Procedure: CATARACT EXTRACTION PHACO AND INTRAOCULAR LENS PLACEMENT RIGHT EYE CDE=10.42;  Surgeon: Perley Hamilton, MD;  Location: [facility];  Service: Ophthalmology;  Laterality: Right;  right   CHOLECYSTECTOMY N/A 06/20/2019   Procedure: LAPAROSCOPIC CHOLECYSTECTOMY;  Surgeon: Kallie Manuelita BROCKS, MD;  Location: [facility];  Service: General;  Laterality: N/A;   COLONOSCOPY N/A 11/27/2015   Procedure: COLONOSCOPY;  Surgeon: Claudis RAYMOND Rivet, MD;  Location: [facility];  Service: Endoscopy;  Laterality: N/A;  730   EXTRACORPOREAL SHOCK WAVE LITHOTRIPSY Left 11/01/2016   Procedure: LEFT  EXTRACORPOREAL SHOCK WAVE LITHOTRIPSY (ESWL);  Surgeon: Nieves Cough, MD;  Location: [facility];  Service: Urology;  Laterality: Left;   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 Authorizing Provider  acetaminophen  (TYLENOL ) 500 MG  tablet Take 1,000 mg by mouth every 6 (six) hours as needed for moderate pain.    Yes [provider]  aspirin  EC 81 MG tablet Take 81 mg by mouth daily. Swallow whole.   Yes [provider]  Cholecalciferol  (DIALYVITE VITAMIN D  5000) 125 MCG (5000 UT) capsule Take 5,000 Units by mouth at bedtime.   Yes [provider]  levothyroxine  (SYNTHROID ) 100 MCG tablet Take 1 tablet (100 mcg total) by mouth daily before breakfast. 08/30/23  Yes Cook, Kelly G, DO  metFORMIN  (GLUCOPHAGE -XR) 500 MG 24 hr tablet Take 1 tablet (500 mg total) by mouth every evening. 08/30/23  Yes Cook, Kelly G, DO  1st Choice Lancets Ultra Thin MISC  03/14/12   [provider]  ACCU-CHEK AVIVA PLUS test strip USE TO TEST BLOOD SUGAR EVERY DAY 05/15/19   [provider]  Blood Glucose Monitoring Suppl DEVI 1 Device by Other route daily before breakfast. May substitute to any manufacturer covered by patient's insurance 08/15/23   Cook, Kelly G, DO  estradiol (ESTRACE) 0.1 MG/GM vaginal cream Place vaginally. Dime size amount nightly 03/30/23   [provider]  fluconazole  (DIFLUCAN ) 150 MG tablet Take 1 tablet (150 mg total) by mouth once a week. Patient not taking: Reported on 09/26/2023 08/15/23   Cook, Kelly G, DO  Glucose Blood (BLOOD GLUCOSE TEST STRIPS) STRP 1 each by Other route daily before breakfast. May substitute to any manufacturer covered by patient's insurance. 08/15/23   Cook, Kelly G, DO  Lancet Device MISC 1 each by Does not apply route daily before breakfast. May substitute to any manufacturer covered by patient's insurance. 08/15/23   Cook, Kelly G, DO  Lancets Misc. MISC 1 each by Does not apply route daily before breakfast. May substitute to any manufacturer covered by patient's insurance. 08/15/23   Cook, Kelly G, DO  Miconazole  2 % POWD Apply 1 Application topically in the morning and at bedtime. Patient not taking: Reported on 09/26/2023 08/15/23   Cook, Kelly G, DO     Scheduled Meds:  apixaban   5 mg Oral BID   Chlorhexidine  Gluconate Cloth  6 each Topical Q0600   cholecalciferol   5,000 Units Oral QHS   insulin  aspart  0-9 Units Subcutaneous TID WC   levothyroxine   100 mcg Oral QAC breakfast   metoprolol  tartrate  25 mg Oral BID   sodium chloride  flush  3 mL Intravenous Q12H   sodium chloride  flush  3 mL Intravenous Q12H   Continuous Infusions:  sodium chloride  100 mL/hr at 09/27/23 0538   PRN Meds: acetaminophen  **OR** acetaminophen , bisacodyl , hydrALAZINE , HYDROmorphone  (DILAUDID ) injection, ipratropium, levalbuterol , ondansetron  **OR** ondansetron  (ZOFRAN ) IV, oxyCODONE , senna-docusate, sodium phosphate , traZODone   Allergies:   No Known Allergies  Social History:   Social History   Socioeconomic History   Marital status: Widowed    Spouse name: Not on file   Number of children: Not on file   Years of education: Not on file   Highest education level: Not on file  Occupational History  Not on file  Tobacco Use   Smoking status: Former    Types: Cigarettes   Smokeless tobacco: Never  Vaping Use   Vaping status: Never Used  Substance and Sexual Activity   Alcohol use: No   Drug use: No   Sexual activity: Not Currently    Birth control/protection: Surgical    Comment: hyst  Other Topics Concern   Not on file  Social History Narrative   Not on file   Social Drivers of Health   Financial Resource Strain: Low Risk  (04/07/2023)   Overall Financial Resource Strain (CARDIA)    Difficulty of Paying Living Expenses: Not very hard  Food Insecurity: No Food Insecurity (09/26/2023)   Hunger Vital Sign    Worried About Running Out of Food in the Last Year: Never true    Ran Out of Food in the Last Year: Never true  Transportation Needs: No Transportation Needs (09/26/2023)   PRAPARE - Administrator, Civil Service (Medical): No    Lack of Transportation (Non-Medical): No  Physical Activity: Insufficiently Active  (04/07/2023)   Exercise Vital Sign    Days of Exercise per Week: 2 days    Minutes of Exercise per Session: 40 min  Stress: Stress Concern Present (04/07/2023)   [employer] of Occupational Health - Occupational Stress Questionnaire    Feeling of Stress : To some extent  Social Connections: Moderately Integrated (09/26/2023)   Social Connection and Isolation Panel    Frequency of Communication with Friends and Family: More than three times a week    Frequency of Social Gatherings with Friends and Family: Twice a week    Attends Religious Services: More than 4 times per year    Active Member of [employer] or Organizations: Yes    Attends Banker Meetings: More than 4 times per year    Marital Status: Widowed  Intimate Partner Violence: Not At Risk (09/26/2023)   Humiliation, Afraid, Rape, and Kick questionnaire    Fear of Current or Ex-Partner: No    Emotionally Abused: No    Physically Abused: No    Sexually Abused: No    Family History:    Family History  Problem Relation Age of Onset   Cancer Father    Dementia Mother    Diabetes Brother    Heart disease Brother    Mental retardation Daughter        in group home     ROS:  Please see the history of present illness.   All other ROS reviewed and negative.     Physical Exam/Data: Vitals:   09/27/23 0600 09/27/23 0630 09/27/23 0713 09/27/23 0717  BP: (!) 137/55 (!) 126/52  (!) 139/55  Pulse: 73 81  90  Resp: 17 18    Temp:   98 F (36.7 C)   TempSrc:   Oral   SpO2: 95% 95%    Weight:      Height:        Intake/Output Summary (Last 24 hours) at 09/27/2023 0858 Last data filed at 09/27/2023 0538 Gross per 24 hour  Intake 1913.31 ml  Output --  Net 1913.31 ml      09/27/2023    3:46 AM 09/26/2023    6:30 PM 09/26/2023    2:21 PM  Last 3 Weights  Weight (lbs) 153 lb 10.6 oz 154 lb 1.6 oz 153 lb  Weight (kg) 69.7 kg 69.9 kg 69.4 kg     Body mass index  is 28.1 kg/m.  General:  Well nourished, well  developed, in no acute distress HEENT: normal Neck: no JVD Vascular: No carotid bruits; Distal pulses 2+ bilaterally Cardiac:  normal S1, S2; RRR; no murmur  Lungs:  clear to auscultation bilaterally, no wheezing, rhonchi or rales  Abd: soft, nontender, no hepatomegaly  Ext: no pitting edema Musculoskeletal:  No deformities, BUE and BLE strength normal and equal Skin: warm and dry  Neuro:  CNs 2-12 intact, no focal abnormalities noted Psych:  Normal affect   EKG:  The EKG was personally reviewed and demonstrates: Likely 2:1 atrial flutter, HR 136.  Relevant CV Studies:  Carotid Dopplers: 02/2023 IMPRESSION: Right:   Color duplex indicates minimal heterogeneous and calcified plaque, with no hemodynamically significant stenosis by duplex criteria in the extracranial cerebrovascular circulation.   Left:   Heterogeneous and partially calcified plaque at the left carotid bifurcation, with discordant results regarding degree of stenosis by established duplex criteria. Peak velocity suggests 50%-69% stenosis, with the ICA/ CCA ratio suggesting a lesser degree of stenosis. If establishing a more accurate degree of stenosis is required, cerebral angiogram should be considered, or as a second best test, CTA.  Laboratory Data: High Sensitivity Troponin:   Recent Labs  Lab 09/26/23 1445 09/26/23 1626  TROPONINIHS 3 3     Chemistry Recent Labs  Lab 09/26/23 1445 09/26/23 1623 09/27/23 0027  NA 138  --  140  K 3.5  --  4.0  CL 106  --  108  CO2 20*  --  22  GLUCOSE 173*  --  143*  BUN 20  --  16  CREATININE 1.00  --  0.73  CALCIUM 9.0  --  8.5*  MG  --  2.0  --   GFRNONAA 57*  --  >60  ANIONGAP 12  --  10    No results for input(s): PROT, ALBUMIN, AST, ALT, ALKPHOS, BILITOT in the last 168 hours. Lipids No results for input(s): CHOL, TRIG, HDL, LABVLDL, LDLCALC, CHOLHDL in the last 168 hours.  Hematology Recent Labs  Lab 09/26/23 1445  09/27/23 0027  WBC 8.0 5.8  RBC 2.95* 2.62*  HGB 10.4* 9.5*  HCT 31.4* 27.9*  MCV 106.4* 106.5*  MCH 35.3* 36.3*  MCHC 33.1 34.1  RDW 17.4* 17.6*  PLT 329 300   Thyroid   Recent Labs  Lab 09/26/23 1622 09/27/23 0027  TSH  --  1.890  FREET4 1.13*  --     BNPNo results for input(s): BNP, PROBNP in the last 168 hours.  DDimer No results for input(s): DDIMER in the last 168 hours.  Radiology/Studies:  No results found.   Assessment and Plan:  1. Tachycardia - difficult distinction between atach and aflutter, overall most consistent with atach - She has converted back to NSR by review of telemetry but will obtain a 12-Lead EKG to confirm. Remains on IV Cardizem  and Lopressor  25mg  BID (newly started this morning). Will stop IV Cardizem .  - Echocardiogram pending to assess for any structural abnormalities. If echo is reassuring and she maintains NSR, can likely discharge home later today.   2. Carotid Artery Stenosis - Dopplers in 02/2023 showed 50-69% LICA stenosis. Has not been on statin therapy and LDL was at 60 when checked in 07/2023 by review of [facility] DXA. She was on ASA prior to admission and would stop given the need for anticoagulation.   3. Hypothyroidism - TSH 1.890 with Free T3 at 2.3 and Free T4 mildly elevated at 1.13.  Remains on Synthroid  100 mcg daily.   4. MDS - Followed by Hematology. No reports of active bleeding.    Risk Assessment/Risk Scores: CHA2DS2-VASc Score = 5  This indicates a 7.2% annual risk of stroke. The patient's score is based upon: CHF History: 0 HTN History: 0 Diabetes History: 1 Stroke History: 0 Vascular Disease History: 1 (carotid artery stenosis) Age Score: 2 Gender Score: 1   For questions or updates, please contact [facility] Please consult [email or web address] for contact info under    Signed, Laymon CHRISTELLA Qua, PA-C  09/27/2023 8:58 AM   Attending Note   Patient seen and discussed with PA Qua, I  agree with her documentation. 83 yo female history of DM2, myelodysplastic syndrome, presented with palpitations     WBC 8 Hgb 10.4 Plt 329 K 3.5 BUN 20 TSH 1.89 Mg 2 Trop 3--> EKG initially atach vs aflutter Echo pending       1.Tachycardia - difficult to distinguish possible atypical aflutter vs atach. From extensive review of telemetry I believe this is an antrial tachycardia. Can distinguish changes in P wave morphology followed by runs of tachycardia. While on dilt gtt more distinguishable as shorter paroxysms and can see more of the onset and termination. I do not see definitive signs of aflutter  - can discontinue anticoag, would plan on 2 week zio patch - stable NSR this AM, continue oral lopressor  25mg  bid. She is off dilt gtt.  - if echo benign and heart rhythm stable through morning can d/c later today.      Dorn Ross MD

## 2023-09-27 NOTE — TOC Benefit Eligibility Note (Signed)
 Pharmacy Patient Advocate Encounter  Insurance verification completed.    The patient is insured through [employer]. Patient has Medicare and is not eligible for a copay card, but may be able to apply for patient assistance or Medicare RX Payment Plan (Patient Must reach out to their plan, if eligible for payment plan), if available.    Ran test claim for Eliquis 5mg  and the current 30 day co-pay is $40.   This test claim was processed through [facility]- copay amounts may vary at other pharmacies due to [employer], or as the patient moves through the different stages of their insurance plan.

## 2023-09-27 NOTE — Progress Notes (Signed)
 PHARMACY - ANTICOAGULATION CONSULT NOTE  Pharmacy Consult for heparin  Indication: atrial fibrillation  No Known Allergies  Patient Measurements: Height: 5' 2 (157.5 cm) Weight: 69.9 kg (154 lb 1.6 oz) IBW/kg (Calculated) : 50.1 HEPARIN  DW (KG): 64.8  Vital Signs: Temp: 97.7 F (36.5 C) (08/26 0000) Temp Source: Axillary (08/26 0000) BP: 114/31 (08/26 0100) Pulse Rate: 73 (08/26 0100)  Labs: Recent Labs    09/26/23 1445 09/26/23 1626 09/27/23 0027  HGB 10.4*  --  9.5*  HCT 31.4*  --  27.9*  PLT 329  --  300  APTT  --   --  49*  LABPROT  --  15.1 15.9*  INR  --  1.1 1.2  HEPARINUNFRC  --   --  0.11*  CREATININE 1.00  --  0.73  TROPONINIHS 3 3  --     Estimated Creatinine Clearance: 50.5 mL/min (by C-G formula based on SCr of 0.73 mg/dL).   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    Unknown   aspirin  EC 81 MG tablet Take 81 mg by mouth daily. Swallow whole.   09/25/2023   Cholecalciferol  (DIALYVITE VITAMIN D  5000) 125 MCG (5000 UT) capsule Take 5,000 Units by mouth at bedtime.   09/25/2023 Evening   levothyroxine  (SYNTHROID ) 100 MCG tablet Take 1 tablet (100 mcg total) by mouth daily before breakfast. 90 tablet 2 09/26/2023 Morning   metFORMIN  (GLUCOPHAGE -XR) 500 MG 24 hr tablet Take 1 tablet (500 mg total) by mouth every evening. 90 tablet 2 09/25/2023 Evening   1st  Choice Lancets Ultra Thin MISC       ACCU-CHEK AVIVA PLUS test strip USE TO TEST BLOOD SUGAR EVERY DAY      Blood Glucose Monitoring Suppl DEVI 1 Device by Other route daily before breakfast. May substitute to any manufacturer covered by patient's insurance 1 each 1    estradiol (ESTRACE) 0.1 MG/GM vaginal cream Place vaginally. Dime size amount nightly      fluconazole  (DIFLUCAN ) 150 MG tablet Take 1 tablet (150 mg total) by mouth once a week. (Patient not taking: Reported on 09/26/2023) 4 tablet 0 Not Taking   Glucose Blood (BLOOD GLUCOSE TEST STRIPS) STRP 1 each by Other route daily before breakfast. May substitute to any manufacturer covered by patient's insurance. 100 strip 1    Lancet Device MISC 1 each by Does not apply route daily before breakfast. May substitute to any manufacturer covered by patient's insurance. 1 each 1    Lancets Misc. MISC 1 each by Does not apply route daily before breakfast. May substitute to any manufacturer covered by patient's insurance. 100 each 1    Miconazole  2 % POWD Apply 1 Application topically in the morning and at bedtime. (Patient not taking: Reported on 09/26/2023) 85 g 1 Not Taking    Assessment: Pharmacy consulted to  dose heparin  in patient with atrial fibrillation.  Patient is not on anticoagulation prior to admission.  CBC WNL  8/26 AM update:  Heparin  level sub-therapeutic   Goal of Therapy:  Heparin  level 0.3-0.7 units/ml Monitor platelets by anticoagulation protocol: Yes   Plan:  Inc heparin  to 1050 units/hr Heparin  level in 8 hours  Lynwood Mckusick, PharmD, BCPS Clinical Pharmacist Phone: [phone]

## 2023-09-27 NOTE — Progress Notes (Signed)
 Transition of Care Department [facility]) has reviewed patient and no other TOC needs have been identified at this time. We will continue to monitor patient advancement through interdisciplinary progression rounds. If new patient transition needs arise, please place a TOC consult.   09/27/23 1126  TOC Brief Assessment  Insurance and Status Reviewed  Patient has primary care physician Yes  Home environment has been reviewed Lives alone  Prior level of function: Independent  Prior/Current Home Services No current home services  Social Drivers of Health Review SDOH reviewed no interventions necessary  Readmission risk has been reviewed Yes  Transition of care needs no transition of care needs at this time

## 2023-09-27 NOTE — Care Management CC44 (Signed)
 Condition Code 44 Documentation Completed  Patient Details  Name: Kelly Cook MRN: [id number] Date of Birth: 1940/05/21   Condition Code 44 given:  Yes Patient signature on Condition Code 44 notice:  Yes Documentation of 2 MD's agreement:  Yes Code 44 added to claim:  Yes    Mcarthur Saddie Kim, LCSW 09/27/2023, 11:25 AM

## 2023-09-27 NOTE — Progress Notes (Signed)
 OT Cancellation Note  Patient Details Name: Kelly Cook MRN: [id number] DOB: 02/23/1940   Cancelled Treatment:    Reason Eval/Treat Not Completed: OT screened, no needs identified, will sign off. Pt reports having no concerns and is walking around taking care of herself in the room. Pt will be removed from the OT list.   Jayson Person 09/27/2023, 10:20 AM

## 2023-09-27 NOTE — Progress Notes (Incomplete)
 Attending Note  Patient seen and discussed with PA Johnson, I agree with her documentation. 83 yo female history of DM2, myelodysplastic syndrome, presented with palpitations   WBC 8 Hgb 10.4 Plt 329 K 3.5 BUN 20 TSH 1.89 Mg 2 Trop 3--> EKG initially atach vs aflutter Echo pending    1.Tachycardia - difficult to distinguish possible atypical aflutter vs atach. From extensive review of telemetry I believe this is an antrial tachycardia. Can distinguish changes in P wave morphology followed by runs of tachycardia. While on dilt gtt more distinguishable as shorter paroxysms and can see more of the onset and termination. I do not see definitive signs of aflutter  - can discontinue anticoag, would plan on 2 week zio patch - stable NSR this AM, continue oral lopressor  25mg  bid. She is off dilt gtt.  - if echo benign and heart rhythm stable through morning can d/c later today.    Dorn Ross MD

## 2023-09-27 NOTE — Progress Notes (Signed)
 Reviewed discharge instructions with the patient and all questions answered. IVs removed and sites intact. Patient satisfied that all belongings returned. Security called to escort the patient to her car.

## 2023-09-27 NOTE — Discharge Summary (Signed)
 Physician Discharge Summary   Patient: Kelly Cook MRN: [id number] DOB: [birth year]  Admit date:     09/26/2023  Discharge date: 09/27/23  Discharge Physician: Adriana DELENA Grams   PCP: Cook, [name], DO   Recommendations at discharge:  - Follow-up with the PCP in 1 week - Follow-up with cardiology Dr. Alvan in 1 week, (further evaluation of heart monitoring as an outpatient) -continue recommended medication of metoprolol  for rate control, - No anticoagulation recommended this point   Discharge Diagnoses: Principal Problem:   Atrial flutter (HCC) Active Problems:   Hypothyroid   MDS (myelodysplastic syndrome) (HCC)   Diabetes (HCC)   Anemia of chronic disease   Vitamin D  deficiency    Hospital Course: Kelly Cook 83 year old female with history of DM2, vitamin D  deficiency, hypothyroidism, myelodysplastic syndrome.. Presenting today with a chief complaint of new onset of palpitation.  Stating feeling that her heart is beating fast.  She has been under stress lately taking care of her daughter who is mentally disabled. No history of previous dysrhythmia.  Denies any chest pain. Denies of having any recent illnesses or change in medications.   ED evaluation: Blood pressure 127/62, pulse (!) 133, temperature 98.4 F, resp. rate 13, height 5' 1.5 (1.562 m), weight 69.4 kg, SpO2 97%.  Labs: CBC within normal limits and hemoglobin 10.4, hematocrit 31.4, MCV of 100.6, CMP within normal limits exception of CO2 of 20, glucose 173, magnesium  2.0, troponin 3, 3,  EKG: A flutter   EDP started IV heparin , IV diltiazem  drip Consult cardiology Dr. Alvan see the patient accordingly.  ------------------------------------------------------------------------------------------------------   * ? Atrial flutter (HCC) -tachycardia -Patient has converted to normal sinus rhythm briefly after admission - POA heart rate 133  -Patient was admitted on the presumption of a flutter,  A-fib  -EKG and rhythm has been reviewed in detail by cardiology Dr. Alvan who believes that her tachycardia more consistent with atrial tachycardia difficult to distinguish between flutter  -Started on metoprolol  25 mg p.o. twice daily - No anticoagulation recommended at this point  -Status post treatment on Cardizem  drip - discontinued this a.m. - Heparin  drip -discontinued this a.m.  Echo: EF 60-65%, negative for any structural abnormality   -Recycle troponin currently 3, 3 - Repeat EKG as needed - Monitor electrolytes, keeping K >4, mag> 2 - Potassium 3.5, 20 meq  of potassium was given , 2 g of magnesium   - TSH normal at 1.89  -------------------------------------------------------------------------------------- MDS (myelodysplastic syndrome) (HCC) - Stable -To follow-up with primary oncologist -Monitoring closely with CBC, on heparin  drip  Hypothyroid - TSH normal at 1.89 -Continue current home dose of Synthroid   Anemia of chronic disease Anemia of chronic disease with a myelodysplastic syndrome Monitoring H&H closely And iron levels, B12 and folate  Diabetes (HCC) - Current blood glucose level 173, - History of diabetes mellitus type 2, on metformin  - Last A1c 6.5, checking A1c -Holding metformin , checking CBG q. ACHS with SSI coverage  Vitamin D  deficiency Continue vitamin D  supplements         Consultants: Cardiology Procedures performed: Echocardiogram Disposition: Home Diet recommendation:  Discharge Diet Orders (From admission, onward)     Start     Ordered   09/27/23 0000  Diet - low sodium heart healthy        09/27/23 1109           Cardiac and Carb modified diet DISCHARGE MEDICATION: Allergies as of 09/27/2023   No Known Allergies  Medication List     STOP taking these medications    fluconazole  150 MG tablet Commonly known as: Diflucan    Miconazole  2 % Powd       TAKE these medications    1st Choice Lancets  Ultra Thin Misc   Accu-Chek Aviva Plus test strip Generic drug: glucose blood USE TO TEST BLOOD SUGAR EVERY DAY   BLOOD GLUCOSE TEST STRIPS Strp 1 each by Other route daily before breakfast. May substitute to any manufacturer covered by patient's insurance.   acetaminophen  500 MG tablet Commonly known as: TYLENOL  Take 1,000 mg by mouth every 6 (six) hours as needed for moderate pain.   aspirin  EC 81 MG tablet Take 81 mg by mouth daily. Swallow whole.   Blood Glucose Monitoring Suppl Devi 1 Device by Other route daily before breakfast. May substitute to any manufacturer covered by patient's insurance   Dialyvite Vitamin D  5000 125 MCG (5000 UT) capsule Generic drug: Cholecalciferol  Take 5,000 Units by mouth at bedtime.   estradiol 0.1 MG/GM vaginal cream Commonly known as: ESTRACE Place vaginally. Dime size amount nightly   Lancet Device Misc 1 each by Does not apply route daily before breakfast. May substitute to any manufacturer covered by patient's insurance.   Lancets Misc. Misc 1 each by Does not apply route daily before breakfast. May substitute to any manufacturer covered by patient's insurance.   levothyroxine  100 MCG tablet Commonly known as: SYNTHROID  Take 1 tablet (100 mcg total) by mouth daily before breakfast.   metFORMIN  500 MG 24 hr tablet Commonly known as: GLUCOPHAGE -XR Take 1 tablet (500 mg total) by mouth every evening.   metoprolol  tartrate 25 MG tablet Commonly known as: LOPRESSOR  Take 1 tablet (25 mg total) by mouth 2 (two) times daily.        Follow-up Information     Johnson Laymon HERO, PA-C Follow up on 11/09/2023.   Specialties: Cardiology, Cardiology Why: Cardiology Follow-up on 11/09/2023 at 9:30 AM. The office will mail you a 2-week monitor to wear in the interim. If you do not receive this within 1 week or have issues placing the monitor, please contact the office. Contact information: [address] [place]  [address] [phone]                Discharge Exam: Kelly Cook   09/26/23 1421 09/26/23 1830 09/27/23 0346  Weight: 69.4 kg 69.9 kg 69.7 kg        General:  AAO x 3,  cooperative, no distress;   HEENT:  Normocephalic, PERRL, otherwise with in Normal limits   Neuro:  CNII-XII intact. , normal motor and sensation, reflexes intact   Lungs:   Clear to auscultation BL, Respirations unlabored,  No wheezes / crackles  Cardio:    S1/S2, RRR, No murmure, No Rubs or Gallops   Abdomen:  Soft, non-tender, bowel sounds active all four quadrants, no guarding or peritoneal signs.  Muscular  skeletal:  Limited exam -global generalized weaknesses - in bed, able to move all 4 extremities,   2+ pulses,  symmetric, No pitting edema  Skin:  Dry, warm to touch, negative for any Rashes,  Wounds: Please see nursing documentation          Condition at discharge: good  The results of significant diagnostics from this hospitalization (including imaging, microbiology, ancillary and laboratory) are listed below for reference.   Imaging Studies: ECHOCARDIOGRAM COMPLETE Result Date: 09/27/2023    ECHOCARDIOGRAM REPORT   Patient Name:   TEANDRA  A Radcliffe Date of Exam: 09/27/2023 Medical Rec #:  [id number]         Height:       62.0 in Accession #:    [id number]        Weight:       153.7 lb Date of Birth:  12-17-1940        BSA:          1.709 m Patient Age:    82 years          BP:           139/55 mmHg Patient Gender: F                 HR:           63 bpm. Exam Location:  [facility] Procedure: 2D Echo, Cardiac Doppler and Color Doppler (Both Spectral and Color            Flow Doppler were utilized during procedure). Indications:    Aflutter  History:        Patient has no prior history of Echocardiogram examinations.                 Risk Factors:Diabetes.  Sonographer:    Therisa Crouch Referring Phys: [phone] Dishon Kehoe A Kagan Mutchler IMPRESSIONS  1. Left ventricular ejection fraction, by estimation, is 60  to 65%. The left ventricle has normal function. The left ventricle has no regional wall motion abnormalities. There is moderate left ventricular hypertrophy of the basal-septal segment. Left ventricular diastolic parameters are indeterminate.  2. Right ventricular systolic function is normal. The right ventricular size is normal. Tricuspid regurgitation signal is inadequate for assessing PA pressure.  3. The mitral valve is normal in structure. No evidence of mitral valve regurgitation. No evidence of mitral stenosis.  4. The aortic valve is tricuspid. Aortic valve regurgitation is not visualized.  5. The inferior vena cava is normal in size with greater than 50% respiratory variability, suggesting right atrial pressure of 3 mmHg. FINDINGS  Left Ventricle: Moderate basal septal hypertrophy with mild chordial SAM with turbulent LVOT flow but no significant gradient. Left ventricular ejection fraction, by estimation, is 60 to 65%. The left ventricle has normal function. The left ventricle has no regional wall motion abnormalities. The left ventricular internal cavity size was normal in size. There is moderate left ventricular hypertrophy of the basal-septal segment. Left ventricular diastolic parameters are indeterminate. Right Ventricle: The right ventricular size is normal. Right vetricular wall thickness was not well visualized. Right ventricular systolic function is normal. Tricuspid regurgitation signal is inadequate for assessing PA pressure. Left Atrium: Left atrial size was normal in size. Right Atrium: Right atrial size was normal in size. Pericardium: There is no evidence of pericardial effusion. Mitral Valve: The mitral valve is normal in structure. No evidence of mitral valve regurgitation. No evidence of mitral valve stenosis. Tricuspid Valve: The tricuspid valve is normal in structure. Tricuspid valve regurgitation is not demonstrated. No evidence of tricuspid stenosis. Aortic Valve: The aortic valve is  tricuspid. Aortic valve regurgitation is not visualized. Aortic valve mean gradient measures 3.6 mmHg. Aortic valve peak gradient measures 6.3 mmHg. Aortic valve area, by VTI measures 2.65 cm. Pulmonic Valve: The pulmonic valve was not well visualized. Pulmonic valve regurgitation is not visualized. No evidence of pulmonic stenosis. Aorta: The aortic root and ascending aorta are structurally normal, with no evidence of dilitation. Venous: The inferior vena cava is normal in size with greater than 50% respiratory  variability, suggesting right atrial pressure of 3 mmHg. IAS/Shunts: No atrial level shunt detected by color flow Doppler.  LEFT VENTRICLE PLAX 2D LVIDd:         4.50 cm   Diastology LVIDs:         3.00 cm   LV e' medial:    8.59 cm/s LV PW:         1.00 cm   LV E/e' medial:  11.8 LV IVS:        0.80 cm   LV e' lateral:   8.05 cm/s LVOT diam:     1.90 cm   LV E/e' lateral: 12.5 LV SV:         80 LV SV Index:   47 LVOT Area:     2.84 cm  RIGHT VENTRICLE            IVC RV Basal diam:  3.20 cm    IVC diam: 1.60 cm RV S prime:     9.36 cm/s TAPSE (M-mode): 1.6 cm LEFT ATRIUM             Index LA diam:        3.40 cm 1.99 cm/m LA Vol (A2C):   41.5 ml 24.28 ml/m LA Vol (A4C):   42.8 ml 25.04 ml/m LA Biplane Vol: 42.6 ml 24.93 ml/m  AORTIC VALVE AV Area (Vmax):    2.89 cm AV Area (Vmean):   2.74 cm AV Area (VTI):     2.65 cm AV Vmax:           125.12 cm/s AV Vmean:          91.619 cm/s AV VTI:            0.300 m AV Peak Grad:      6.3 mmHg AV Mean Grad:      3.6 mmHg LVOT Vmax:         127.34 cm/s LVOT Vmean:        88.593 cm/s LVOT VTI:          0.281 m LVOT/AV VTI ratio: 0.93  AORTA Ao Root diam: 3.40 cm Ao Asc diam:  3.00 cm MITRAL VALVE MV Area (PHT): 4.80 cm     SHUNTS MV Decel Time: 158 msec     Systemic VTI:  0.28 m MV E velocity: 101.00 cm/s  Systemic Diam: 1.90 cm MV A velocity: 92.80 cm/s MV E/A ratio:  1.09 Dorn Ross MD Electronically signed by Dorn Ross MD Signature Date/Time:  09/27/2023/10:28:44 AM    Final     Microbiology: Results for orders placed or performed during the hospital encounter of 09/26/23  MRSA Next Gen by PCR, Nasal     Status: None   Collection Time: 09/26/23  5:08 PM   Specimen: Nasal Mucosa; Nasal Swab  Result Value Ref Range Status   MRSA by PCR Next Gen NOT DETECTED NOT DETECTED Final    Comment: (NOTE) The GeneXpert MRSA Assay (FDA approved for NASAL specimens only), is one component of a comprehensive MRSA colonization surveillance program. It is not intended to diagnose MRSA infection nor to guide or monitor treatment for MRSA infections. Test performance is not FDA approved in patients less than 7 years old. Performed at [facility], [address] [place], [place] [address]     Labs: CBC: Recent Labs  Lab 09/26/23 1445 09/27/23 0027  WBC 8.0 5.8  NEUTROABS 4.4  --   HGB 10.4* 9.5*  HCT 31.4* 27.9*  MCV 106.4*  106.5*  PLT 329 300   Basic Metabolic Panel: Recent Labs  Lab 09/26/23 1445 09/26/23 1623 09/26/23 1626 09/27/23 0027  NA 138  --   --  140  K 3.5  --   --  4.0  CL 106  --   --  108  CO2 20*  --   --  22  GLUCOSE 173*  --   --  143*  BUN 20  --   --  16  CREATININE 1.00  --   --  0.73  CALCIUM 9.0  --   --  8.5*  MG  --  2.0  --   --   PHOS  --   --  3.4  --    Liver Function Tests: No results for input(s): AST, ALT, ALKPHOS, BILITOT, PROT, ALBUMIN in the last 168 hours. CBG: Recent Labs  Lab 09/26/23 1827 09/26/23 2152 09/27/23 0711  GLUCAP 200* 142* 156*    Discharge time spent: greater than 30 minutes.  Signed: Adriana DELENA Grams, MD [facility] 09/27/2023

## 2023-09-28 ENCOUNTER — Other Ambulatory Visit: Payer: Self-pay

## 2023-09-28 ENCOUNTER — Telehealth: Payer: Self-pay

## 2023-09-28 ENCOUNTER — Ambulatory Visit: Attending: Cardiology

## 2023-09-28 DIAGNOSIS — I4892 Unspecified atrial flutter: Secondary | ICD-10-CM

## 2023-09-28 NOTE — Telephone Encounter (Signed)
 Zio ordered,to be mailed to pt

## 2023-09-28 NOTE — Telephone Encounter (Signed)
-----   Message from Laymon CHRISTELLA Qua sent at 09/27/2023  9:54 AM EDT ----- Regarding: 2-week monitor This patient needs a 2-week Zio patch per Dr. Alvan for atrial tachycardia and palpitations.   Thanks,  [place]

## 2023-09-30 ENCOUNTER — Ambulatory Visit (HOSPITAL_COMMUNITY)
Admission: RE | Admit: 2023-09-30 | Discharge: 2023-09-30 | Disposition: A | Discharge status: Home / Self Care | Source: Ambulatory Visit | Attending: Family Medicine | Admitting: Family Medicine

## 2023-09-30 ENCOUNTER — Ambulatory Visit: Admitting: Family Medicine

## 2023-09-30 ENCOUNTER — Encounter: Payer: Self-pay | Admitting: Family Medicine

## 2023-09-30 VITALS — BP 108/70 | HR 60 | Temp 96.6°F | Ht 62.0 in | Wt 151.0 lb

## 2023-09-30 DIAGNOSIS — I4719 Other supraventricular tachycardia: Secondary | ICD-10-CM

## 2023-09-30 DIAGNOSIS — M4856XA Collapsed vertebra, not elsewhere classified, lumbar region, initial encounter for fracture: Secondary | ICD-10-CM | POA: Diagnosis not present

## 2023-09-30 DIAGNOSIS — M47816 Spondylosis without myelopathy or radiculopathy, lumbar region: Secondary | ICD-10-CM | POA: Diagnosis not present

## 2023-09-30 MED ORDER — METOPROLOL SUCCINATE ER 25 MG PO TB24: 25.0000 mg | ORAL_TABLET | Freq: Every day | ORAL | 3 refills | Status: DC

## 2023-09-30 NOTE — Patient Instructions (Signed)
 Metoprolol  changed.  Call/message with concerns.  Follow up in 2 weeks.

## 2023-10-03 ENCOUNTER — Ambulatory Visit: Payer: Self-pay | Admitting: Family Medicine

## 2023-10-03 DIAGNOSIS — I4719 Other supraventricular tachycardia: Secondary | ICD-10-CM | POA: Insufficient documentation

## 2023-10-03 NOTE — Progress Notes (Signed)
 Subjective:  Patient ID: Kelly Cook, female    DOB: 1940-03-14  Age: 83 y.o. MRN: [id number]  CC:   Chief Complaint  Patient presents with   Hospitalization Follow-up    Has exp a few flutters since discharge and weakness     HPI:  83 year old female presents for follow up.  Recent admission from 8/25 to 8/26. Presented with palpitations. Atrial tachycardia vs flutter. Treated with Diltiazem  and converted by to sinus rhythm. Discharged home on Metoprolol .   Patient reports that she is feeling very weak/fatigued. Has had some intermittent flutters. Does not have cardiology follow up until October.   She also reports that she recently heard a pop in her low back and was seen by a chiropractor who informed her that she had a compression fracture at L3. No significant pain today.  Patient Active Problem List   Diagnosis Date Noted   Atrial tachycardia (HCC) 10/03/2023   Vitamin D  deficiency 09/26/2023   Anemia of chronic disease 09/26/2023   Osteopenia 08/15/2023   Intertrigo 08/15/2023   S/P hysterectomy 04/07/2023   Vaginal atrophy 04/07/2023   MDS (myelodysplastic syndrome) (HCC) 04/21/2022   Urinary incontinence 07/02/2015   Hypothyroid 06/16/2012   Diabetes (HCC) 06/16/2012    Social Hx   Social History   Socioeconomic History   Marital status: Widowed    Spouse name: Not on file   Number of children: Not on file   Years of education: Not on file   Highest education level: Not on file  Occupational History   Not on file  Tobacco Use   Smoking status: Former    Types: Cigarettes   Smokeless tobacco: Never  Vaping Use   Vaping status: Never Used  Substance and Sexual Activity   Alcohol use: No   Drug use: No   Sexual activity: Not Currently    Birth control/protection: Surgical    Comment: hyst  Other Topics Concern   Not on file  Social History Narrative   Not on file   Social Drivers of Health   Financial Resource Strain: Low Risk   (04/07/2023)   Overall Financial Resource Strain (CARDIA)    Difficulty of Paying Living Expenses: Not very hard  Food Insecurity: No Food Insecurity (09/26/2023)   Hunger Vital Sign    Worried About Running Out of Food in the Last Year: Never true    Ran Out of Food in the Last Year: Never true  Transportation Needs: No Transportation Needs (09/26/2023)   PRAPARE - Administrator, Civil Service (Medical): No    Lack of Transportation (Non-Medical): No  Physical Activity: Insufficiently Active (04/07/2023)   Exercise Vital Sign    Days of Exercise per Week: 2 days    Minutes of Exercise per Session: 40 min  Stress: Stress Concern Present (04/07/2023)   [employer] of Occupational Health - Occupational Stress Questionnaire    Feeling of Stress : To some extent  Social Connections: Moderately Integrated (09/26/2023)   Social Connection and Isolation Panel    Frequency of Communication with Friends and Family: More than three times a week    Frequency of Social Gatherings with Friends and Family: Twice a week    Attends Religious Services: More than 4 times per year    Active Member of [employer] or Organizations: Yes    Attends Banker Meetings: More than 4 times per year    Marital Status: Widowed    Review of Systems  Per HPI  Objective:  BP 108/70   Pulse 60   Temp (!) 96.6 F (35.9 C)   Ht 5' 2 (1.575 m)   Wt 151 lb (68.5 kg)   SpO2 97%   BMI 27.62 kg/m      09/30/2023   10:03 AM 09/27/2023    7:17 AM 09/27/2023    6:30 AM  BP/Weight  Systolic BP 108 139 126  Diastolic BP 70 55 52  Wt. (Lbs) 151    BMI 27.62 kg/m2      Physical Exam Vitals and nursing note reviewed.  Constitutional:      General: She is not in acute distress.    Appearance: Normal appearance.  Eyes:     General:        Right eye: No discharge.        Left eye: No discharge.     Conjunctiva/sclera: Conjunctivae normal.  Cardiovascular:     Rate and Rhythm: Normal rate and  regular rhythm.  Pulmonary:     Effort: Pulmonary effort is normal.     Breath sounds: Normal breath sounds.  Neurological:     Mental Status: She is alert.  Psychiatric:        Mood and Affect: Mood normal.        Behavior: Behavior normal.     Lab Results  Component Value Date   WBC 5.8 09/27/2023   HGB 9.5 (L) 09/27/2023   HCT 27.9 (L) 09/27/2023   PLT 300 09/27/2023   GLUCOSE 143 (H) 09/27/2023   ALT 16 06/02/2023   AST 17 06/02/2023   NA 140 09/27/2023   K 4.0 09/27/2023   CL 108 09/27/2023   CREATININE 0.73 09/27/2023   BUN 16 09/27/2023   CO2 22 09/27/2023   TSH 1.890 09/27/2023   INR 1.2 09/27/2023   HGBA1C 6.7 (H) 09/26/2023     Assessment & Plan:  Atrial tachycardia (HCC) Assessment & Plan: Side effects from treatment with Metoprolol . Changing metoprolol  to succinate and decreasing dose.   Orders: -     Metoprolol  Succinate ER; Take 1 tablet (25 mg total) by mouth daily.  Dispense: 90 tablet; Refill: 3  Nontraumatic compression fracture of L3 vertebra, initial encounter (HCC) -     DG Lumbar Spine Complete    Follow-up:  2 weeks.  Jacqulyn Ahle DO [facility]

## 2023-10-03 NOTE — Assessment & Plan Note (Addendum)
 Side effects from treatment with Metoprolol . Changing metoprolol  to succinate and decreasing dose.

## 2023-10-12 ENCOUNTER — Encounter: Payer: Self-pay | Admitting: Family Medicine

## 2023-10-12 ENCOUNTER — Ambulatory Visit: Admitting: Family Medicine

## 2023-10-12 VITALS — BP 133/72 | HR 87 | Temp 97.2°F | Ht 62.0 in | Wt 154.0 lb

## 2023-10-12 DIAGNOSIS — M810 Age-related osteoporosis without current pathological fracture: Secondary | ICD-10-CM

## 2023-10-12 DIAGNOSIS — I4719 Other supraventricular tachycardia: Secondary | ICD-10-CM | POA: Diagnosis not present

## 2023-10-12 DIAGNOSIS — M8580 Other specified disorders of bone density and structure, unspecified site: Secondary | ICD-10-CM

## 2023-10-12 NOTE — Patient Instructions (Signed)
 Call [phone] to schedule Bone Density.  Follow up in 3 months.

## 2023-10-16 DIAGNOSIS — M810 Age-related osteoporosis without current pathological fracture: Secondary | ICD-10-CM | POA: Insufficient documentation

## 2023-10-16 NOTE — Progress Notes (Signed)
 Subjective:  Patient ID: Kelly Cook, female    DOB: 10/22/1940  Age: 83 y.o. MRN: [id number]  CC:   Chief Complaint  Patient presents with   2 week follow up     Vertebral fx and tachycardia  Pt states she's feeling better   small cyst left outer arm     Since hospital stay    HPI:  83 year old female presents for follow up.  She is feeling much better after switch to Metoprolol  XL. No recent palpitations.   At last visit, patient reported that chiropractor informed her of compression fracture. I ordered and xray and there was no evidence of fracture. She is in need of Dexa scan. Will discuss today.  Patient Active Problem List   Diagnosis Date Noted   Age-related osteoporosis without current pathological fracture 10/16/2023   Atrial tachycardia (HCC) 10/03/2023   Vitamin D  deficiency 09/26/2023   Anemia of chronic disease 09/26/2023   S/P hysterectomy 04/07/2023   Vaginal atrophy 04/07/2023   MDS (myelodysplastic syndrome) (HCC) 04/21/2022   Urinary incontinence 07/02/2015   Hypothyroid 06/16/2012   Diabetes (HCC) 06/16/2012    Social Hx   Social History   Socioeconomic History   Marital status: Widowed    Spouse name: Not on file   Number of children: Not on file   Years of education: Not on file   Highest education level: Not on file  Occupational History   Not on file  Tobacco Use   Smoking status: Former    Types: Cigarettes   Smokeless tobacco: Never  Vaping Use   Vaping status: Never Used  Substance and Sexual Activity   Alcohol use: No   Drug use: No   Sexual activity: Not Currently    Birth control/protection: Surgical    Comment: hyst  Other Topics Concern   Not on file  Social History Narrative   Not on file   Social Drivers of Health   Financial Resource Strain: Low Risk  (04/07/2023)   Overall Financial Resource Strain (CARDIA)    Difficulty of Paying Living Expenses: Not very hard  Food Insecurity: No Food Insecurity  (09/26/2023)   Hunger Vital Sign    Worried About Running Out of Food in the Last Year: Never true    Ran Out of Food in the Last Year: Never true  Transportation Needs: No Transportation Needs (09/26/2023)   PRAPARE - Administrator, Civil Service (Medical): No    Lack of Transportation (Non-Medical): No  Physical Activity: Insufficiently Active (04/07/2023)   Exercise Vital Sign    Days of Exercise per Week: 2 days    Minutes of Exercise per Session: 40 min  Stress: Stress Concern Present (04/07/2023)   [employer] of Occupational Health - Occupational Stress Questionnaire    Feeling of Stress : To some extent  Social Connections: Moderately Integrated (09/26/2023)   Social Connection and Isolation Panel    Frequency of Communication with Friends and Family: More than three times a week    Frequency of Social Gatherings with Friends and Family: Twice a week    Attends Religious Services: More than 4 times per year    Active Member of [employer] or Organizations: Yes    Attends Banker Meetings: More than 4 times per year    Marital Status: Widowed    Review of Systems Per HPI  Objective:  BP 133/72   Pulse 87   Temp (!) 97.2 F (36.2 C)  Ht 5' 2 (1.575 m)   Wt 154 lb (69.9 kg)   SpO2 100%   BMI 28.17 kg/m      10/12/2023   10:26 AM 09/30/2023   10:03 AM 09/27/2023    7:17 AM  BP/Weight  Systolic BP 133 108 139  Diastolic BP 72 70 55  Wt. (Lbs) 154 151   BMI 28.17 kg/m2 27.62 kg/m2     Physical Exam Vitals and nursing note reviewed.  Constitutional:      General: She is not in acute distress.    Appearance: Normal appearance.  Cardiovascular:     Rate and Rhythm: Normal rate and regular rhythm.  Pulmonary:     Effort: Pulmonary effort is normal.     Breath sounds: Normal breath sounds.  Neurological:     Mental Status: She is alert.  Psychiatric:        Mood and Affect: Mood normal.        Behavior: Behavior normal.     Lab  Results  Component Value Date   WBC 5.8 09/27/2023   HGB 9.5 (L) 09/27/2023   HCT 27.9 (L) 09/27/2023   PLT 300 09/27/2023   GLUCOSE 143 (H) 09/27/2023   ALT 16 06/02/2023   AST 17 06/02/2023   NA 140 09/27/2023   K 4.0 09/27/2023   CL 108 09/27/2023   CREATININE 0.73 09/27/2023   BUN 16 09/27/2023   CO2 22 09/27/2023   TSH 1.890 09/27/2023   INR 1.2 09/27/2023   HGBA1C 6.7 (H) 09/26/2023     Assessment & Plan:  Atrial tachycardia [facility]) Assessment & Plan: Doing well on Metoprolol . Continue.   Age-related osteoporosis without current pathological fracture Assessment & Plan: Last bone density revealed 10 year risk of hip fracture of 3.1%. This is consistent with osteoporosis. Discussed starting medication. Patient wanted to wait until repeat bone density. Bone density ordered today.  Orders: -     DG Bone Density    Follow-up:  3 months  Arelie Kuzel Bluford DO [facility]

## 2023-10-16 NOTE — Assessment & Plan Note (Signed)
 Last bone density revealed 10 year risk of hip fracture of 3.1%. This is consistent with osteoporosis. Discussed starting medication. Patient wanted to wait until repeat bone density. Bone density ordered today.

## 2023-10-16 NOTE — Assessment & Plan Note (Signed)
 Doing well on Metoprolol . Continue.

## 2023-10-18 ENCOUNTER — Ambulatory Visit (INDEPENDENT_AMBULATORY_CARE_PROVIDER_SITE_OTHER): Admitting: Physician Assistant

## 2023-10-18 ENCOUNTER — Other Ambulatory Visit (HOSPITAL_COMMUNITY)

## 2023-10-18 ENCOUNTER — Encounter: Payer: Self-pay | Admitting: Physician Assistant

## 2023-10-18 ENCOUNTER — Ambulatory Visit: Payer: Self-pay

## 2023-10-18 VITALS — BP 134/73 | HR 70 | Temp 97.2°F | Ht 62.0 in | Wt 152.0 lb

## 2023-10-18 DIAGNOSIS — Z20822 Contact with and (suspected) exposure to covid-19: Secondary | ICD-10-CM | POA: Diagnosis not present

## 2023-10-18 NOTE — Progress Notes (Signed)
 Acute Office Visit  Subjective:     Patient ID: Kelly Cook, female    DOB: [birth year], 83 y.o.   MRN: [id number]  Discussed the use of AI scribe software for clinical note transcription with the patient, who gave verbal consent to proceed.  History of Present Illness Kelly Cook is an 83 year old female who presents with sore throat and left ear pain.  She has experienced a sore throat and sharp left ear pain since Friday, with the ear pain worsening when swallowing. Runny nose was present but has significantly decreased. She has a mild cough and congestion with minimal phlegm production. She feels fatigued but denies fever.  Her daughter tested positive for COVID-19, and she had contact with her daughter on Saturday. She is using over-the-counter medications, including saline spray, salt water  gargles, and [facility] brand Waltussum for cough, which she finds helpful.  She denies shortness of breath, chest pain, or difficulty breathing. She is maintaining adequate oral intake. Diarrhea was present but has improved since Friday.   Review of Systems  Constitutional:  Positive for malaise/fatigue. Negative for chills and fever.  HENT:  Positive for congestion, ear pain and sore throat. Negative for ear discharge.   Respiratory:  Positive for cough. Negative for sputum production, shortness of breath and wheezing.   Cardiovascular:  Negative for chest pain and palpitations.  Gastrointestinal:  Positive for diarrhea. Negative for nausea and vomiting.  Musculoskeletal:  Negative for myalgias.  Neurological:  Negative for headaches.        Objective:     BP 134/73   Pulse 70   Temp (!) 97.2 F (36.2 C)   Ht 5' 2 (1.575 m)   Wt 152 lb (68.9 kg)   SpO2 98%   BMI 27.80 kg/m   Physical Exam Vitals reviewed.  Constitutional:      General: She is not in acute distress.    Appearance: Normal appearance. She is normal weight. She is ill-appearing.  HENT:      Right Ear: Tympanic membrane normal.     Left Ear: Tympanic membrane normal.     Nose: Congestion present.     Mouth/Throat:     Mouth: Mucous membranes are moist.     Pharynx: Oropharynx is clear. Posterior oropharyngeal erythema present.  Eyes:     Extraocular Movements: Extraocular movements intact.     Conjunctiva/sclera: Conjunctivae normal.  Cardiovascular:     Rate and Rhythm: Normal rate and regular rhythm.     Heart sounds: Normal heart sounds. No murmur heard. Pulmonary:     Effort: Pulmonary effort is normal.     Breath sounds: Normal breath sounds. No wheezing, rhonchi or rales.  Lymphadenopathy:     Cervical: No cervical adenopathy.  Skin:    General: Skin is warm and dry.  Neurological:     General: No focal deficit present.     Mental Status: She is alert and oriented to person, place, and time.  Psychiatric:        Mood and Affect: Mood normal.        Behavior: Behavior normal.     No results found for any visits on 10/18/23.      Assessment & Plan:  Suspected COVID-19 virus infection Assessment & Plan: Suspected COVID-19 due to exposure. Symptoms include sore throat, congestion, and fatigue. Viral etiology likely, low suspicion for bacterial infection. Physical exam reassuring.  - Continue rest and hydration. - Use OTC medications for symptom relief.Advised  Flonase nasal spray. Can continue salt water  gargle for sore throat relief. - Avoid contact until next Monday. - Return if symptoms worsen or persist by next Monday. Warning signs such as shortness of breath or difficulty breathing discussed.     Return if symptoms worsen or fail to improve.  Charmaine Fahima Cifelli, PA-C

## 2023-10-18 NOTE — Assessment & Plan Note (Signed)
 Suspected COVID-19 due to exposure. Symptoms include sore throat, congestion, and fatigue. Viral etiology likely, low suspicion for bacterial infection. Physical exam reassuring.  - Continue rest and hydration. - Use OTC medications for symptom relief.Advised Flonase nasal spray. Can continue salt water  gargle for sore throat relief. - Avoid contact until next Monday. - Return if symptoms worsen or persist by next Monday. Warning signs such as shortness of breath or difficulty breathing discussed.

## 2023-10-18 NOTE — Telephone Encounter (Signed)
 FYI Only or Action Required?: FYI only for provider.  Patient was last seen in primary care on 10/12/2023 by Cook, [name], DO.  Called Nurse Triage reporting Covid Exposure.  Symptoms began several days ago.  Interventions attempted: Other: [facility] Tussin Severe Cold Flu, saline nasal spray, and gargling.  Symptoms are: Post nasal drip, nasal congestion, left ear sharp pain and ringing in ear, cough, left rib pain when coughing, diarrhea( 3-4 episodes daily) unchanged.  Triage Disposition: See Physician Within 24 Hours (overriding Call PCP Within 24 Hours)  Patient/caregiver understands and will follow disposition?: Yes          Copied from CRM #[id number]. Topic: Clinical - Medication Question >> Oct 17, 2023  9:35 AM Charolett L wrote: Reason for CRM: patient is calling to find out what medication she can take with the current medication she's already prescribed >> Oct 18, 2023  9:12 AM Emylou G wrote: Patient called: head stopped up, throat on one side is raw/drainage, chest congestion says was suppose to hear back yesterday.SABRA Unsure if she can take or script she can have?  Does she need to be seen? >> Oct 17, 2023  5:26 PM DeAngela L wrote: Patient is calling to inform the office her daughter that lives at a group home just tested positive for Covid and she believes this is what she has and got it from her, so she is calling back to ask what should she do or what medication should she be taking   Pt num [phone] Reason for Disposition  [1] HIGH RISK patient (e.g., weak immune system, age > 64 years, obesity with BMI 30 or higher, pregnant, chronic lung disease or other chronic medical condition) AND [2] COVID symptoms (e.g., cough, fever)  (Exceptions: Already seen by PCP and no new or worsening symptoms.)  Answer Assessment - Initial Assessment Questions 1. COVID-19 DIAGNOSIS: How do you know that you have COVID? (e.g., positive lab test or self-test, diagnosed by doctor  or NP/PA, symptoms after exposure).     Has not tested but is having symptoms after exposure.  2. COVID-19 EXPOSURE: Was there any known exposure to COVID before the symptoms began? CDC Definition of close contact: within 6 feet (2 meters) for a total of 15 minutes or more over a 24-hour period.      Yes, her daughter. She spent time with her daughter 9/9-9/12 and her daughter tested positive for COVID after.  3. ONSET: When did the COVID-19 symptoms start?      10/14/23.  4. WORST SYMPTOM: What is your worst symptom? (e.g., cough, fever, shortness of breath, muscle aches)     Post nasal drip, nasal congestion, left ear sharp pain, left rib pain when coughing, diarrhea( 3-4 episodes daily).  5. COUGH: Do you have a cough? If Yes, ask: How bad is the cough?       Yes. Sore when coughing especially left rib. She states just a little bit of mucus comes up with the cough.  6. FEVER: Do you have a fever? If Yes, ask: What is your temperature, how was it measured, and when did it start?     No.  7. RESPIRATORY STATUS: Describe your breathing? (e.g., normal; shortness of breath, wheezing, unable to speak)      No difficulty breathing.   8. BETTER-SAME-WORSE: Are you getting better, staying the same or getting worse compared to yesterday?  If getting worse, ask, In what way?     The same except today  she has the diarrhea and soreness when coughing.  9. OTHER SYMPTOMS: Do you have any other symptoms?  (e.g., chills, fatigue, headache, loss of smell or taste, muscle pain, sore throat)     Ringing in ear (hard to tell but feels like on the left side). Denies nausea, vomiting, fever. She states she is drinking plenty of fluids.  10. HIGH RISK DISEASE: Do you have any chronic medical problems? (e.g., asthma, heart or lung disease, weak immune system, obesity, etc.)       She states she has had recent tachycardia; diabetes.  11. VACCINE: Have you had the COVID-19 vaccine?  If Yes, ask: Which one, how many shots, when did you get it?       Yes, 1 shot in 2021.  12. PREGNANCY: Is there any chance you are pregnant? When was your last menstrual period?       N/A.  13. O2 SATURATION MONITOR:  Do you use an oxygen saturation monitor (pulse oximeter) at home? If Yes, ask What is your reading (oxygen level) today? What is your usual oxygen saturation reading? (e.g., 95%)       99%.  Patient has been treating with [facility] Tussin Severe Cold Flu, saline nasal spray, and gargling  Protocols used: Coronavirus (COVID-19) Diagnosed or Suspected-A-AH

## 2023-10-19 ENCOUNTER — Other Ambulatory Visit (HOSPITAL_COMMUNITY): Payer: Self-pay | Admitting: Family Medicine

## 2023-10-19 DIAGNOSIS — Z1231 Encounter for screening mammogram for malignant neoplasm of breast: Secondary | ICD-10-CM

## 2023-10-19 DIAGNOSIS — I4892 Unspecified atrial flutter: Secondary | ICD-10-CM | POA: Diagnosis not present

## 2023-10-25 ENCOUNTER — Other Ambulatory Visit: Payer: Self-pay

## 2023-10-25 DIAGNOSIS — D539 Nutritional anemia, unspecified: Secondary | ICD-10-CM

## 2023-10-25 DIAGNOSIS — D469 Myelodysplastic syndrome, unspecified: Secondary | ICD-10-CM

## 2023-10-26 ENCOUNTER — Inpatient Hospital Stay: Attending: Hematology

## 2023-10-26 DIAGNOSIS — D469 Myelodysplastic syndrome, unspecified: Secondary | ICD-10-CM | POA: Diagnosis not present

## 2023-10-26 DIAGNOSIS — D539 Nutritional anemia, unspecified: Secondary | ICD-10-CM

## 2023-10-26 LAB — CBC WITH DIFFERENTIAL/PLATELET
Abs Immature Granulocytes: 0.04 K/uL (ref 0.00–0.07)
Basophils Absolute: 0.1 K/uL (ref 0.0–0.1)
Basophils Relative: 2 %
Eosinophils Absolute: 0.3 K/uL (ref 0.0–0.5)
Eosinophils Relative: 5 %
HCT: 29.3 % — ABNORMAL LOW (ref 36.0–46.0)
Hemoglobin: 9.7 g/dL — ABNORMAL LOW (ref 12.0–15.0)
Immature Granulocytes: 1 %
Lymphocytes Relative: 34 %
Lymphs Abs: 2 K/uL (ref 0.7–4.0)
MCH: 34.6 pg — ABNORMAL HIGH (ref 26.0–34.0)
MCHC: 33.1 g/dL (ref 30.0–36.0)
MCV: 104.6 fL — ABNORMAL HIGH (ref 80.0–100.0)
Monocytes Absolute: 0.7 K/uL (ref 0.1–1.0)
Monocytes Relative: 12 %
Neutro Abs: 2.8 K/uL (ref 1.7–7.7)
Neutrophils Relative %: 46 %
Platelets: 302 K/uL (ref 150–400)
RBC: 2.8 MIL/uL — ABNORMAL LOW (ref 3.87–5.11)
RDW: 16.9 % — ABNORMAL HIGH (ref 11.5–15.5)
WBC: 5.8 K/uL (ref 4.0–10.5)
nRBC: 2.2 % — ABNORMAL HIGH (ref 0.0–0.2)

## 2023-10-26 LAB — COMPREHENSIVE METABOLIC PANEL WITH GFR
ALT: 20 U/L (ref 0–44)
AST: 19 U/L (ref 15–41)
Albumin: 3.6 g/dL (ref 3.5–5.0)
Alkaline Phosphatase: 36 U/L — ABNORMAL LOW (ref 38–126)
Anion gap: 12 (ref 5–15)
BUN: 14 mg/dL (ref 8–23)
CO2: 21 mmol/L — ABNORMAL LOW (ref 22–32)
Calcium: 9.3 mg/dL (ref 8.9–10.3)
Chloride: 107 mmol/L (ref 98–111)
Creatinine, Ser: 0.83 mg/dL (ref 0.44–1.00)
GFR, Estimated: >60 mL/min (ref >60)
Glucose, Bld: 171 mg/dL — ABNORMAL HIGH (ref 70–99)
Potassium: 4.2 mmol/L (ref 3.5–5.1)
Sodium: 140 mmol/L (ref 135–145)
Total Bilirubin: 1.2 mg/dL (ref 0.0–1.2)
Total Protein: 7 g/dL (ref 6.5–8.1)

## 2023-10-26 LAB — IRON AND TIBC
Iron: 213 ug/dL — ABNORMAL HIGH (ref 28–170)
Saturation Ratios: 67 % — ABNORMAL HIGH (ref 10.4–31.8)
TIBC: 316 ug/dL (ref 250–450)
UIBC: 103 ug/dL

## 2023-10-26 LAB — LACTATE DEHYDROGENASE: LDH: 120 U/L (ref 98–192)

## 2023-10-26 LAB — FERRITIN: Ferritin: 318 ng/mL — ABNORMAL HIGH (ref 11–307)

## 2023-10-31 ENCOUNTER — Other Ambulatory Visit (HOSPITAL_COMMUNITY)

## 2023-11-02 ENCOUNTER — Other Ambulatory Visit (HOSPITAL_COMMUNITY)

## 2023-11-02 ENCOUNTER — Inpatient Hospital Stay: Attending: Hematology | Admitting: Oncology

## 2023-11-02 ENCOUNTER — Ambulatory Visit (HOSPITAL_COMMUNITY)
Admission: RE | Admit: 2023-11-02 | Discharge: 2023-11-02 | Disposition: A | Discharge status: Home / Self Care | Source: Ambulatory Visit | Attending: Family Medicine | Admitting: Family Medicine

## 2023-11-02 ENCOUNTER — Ambulatory Visit (HOSPITAL_COMMUNITY)

## 2023-11-02 VITALS — BP 133/64 | HR 70 | Temp 97.8°F | Resp 18 | Ht 61.5 in | Wt 150.4 lb

## 2023-11-02 DIAGNOSIS — Z1231 Encounter for screening mammogram for malignant neoplasm of breast: Secondary | ICD-10-CM | POA: Diagnosis not present

## 2023-11-02 DIAGNOSIS — D469 Myelodysplastic syndrome, unspecified: Secondary | ICD-10-CM | POA: Diagnosis not present

## 2023-11-02 DIAGNOSIS — M810 Age-related osteoporosis without current pathological fracture: Secondary | ICD-10-CM | POA: Diagnosis not present

## 2023-11-02 DIAGNOSIS — D649 Anemia, unspecified: Secondary | ICD-10-CM

## 2023-11-02 DIAGNOSIS — Z780 Asymptomatic menopausal state: Secondary | ICD-10-CM | POA: Diagnosis not present

## 2023-11-02 DIAGNOSIS — Z79899 Other long term (current) drug therapy: Secondary | ICD-10-CM | POA: Insufficient documentation

## 2023-11-02 DIAGNOSIS — M8589 Other specified disorders of bone density and structure, multiple sites: Secondary | ICD-10-CM | POA: Diagnosis not present

## 2023-11-02 NOTE — Progress Notes (Signed)
 [name] OFFICE PROGRESS NOTE  [name], Kelly G, DO  ASSESSMENT & PLAN:    Assessment & Plan MDS (myelodysplastic syndrome) (HCC) -Had bone marrow biopsy on 04/15/2022 which showed hypercellular dyspoietic changes associated with ring sideroblasts more than 15% of erythroid precursors.  No increase in blasts noted.  No mono clonal B-cell population.  No increase in plasma cells. - NGS: SF3B1 - MDS FISH: Normal.  Cytogenetics: 46, XX[20] -She is currently not on treatment.  We have been monitoring her labs about every 3 to 4 months. - Hemoglobin has more or less remained stable.  Current hemoglobin is 9.7. -She is hoping to avoid starting luspatercept  at this time. -Recommend follow-up in 3 months with labs a few days before and office visit. Anemia, unspecified type Etiology felt to be due to underlying MDS. At her last visit, we discussed potentially initiating luspatercept  should her hemoglobin continue to trend down. Most recent labs showed improvement of her hemoglobin from 9.6-9.7. We discussed continuing to monitor every 3 months for now.  Orders Placed This Encounter  Procedures   CBC with Differential    Standing Status:   Future    Expected Date:   02/02/2024    Expiration Date:   05/02/2024   Comprehensive metabolic panel    Standing Status:   Future    Expected Date:   02/02/2024    Expiration Date:   05/02/2024   Lactate dehydrogenase    Standing Status:   Future    Expected Date:   02/02/2024    Expiration Date:   05/02/2024   Ferritin    Standing Status:   Future    Expected Date:   02/02/2024    Expiration Date:   05/02/2024   Iron and TIBC ([facility] ONLY)    Standing Status:   Future    Expected Date:   02/02/2024    Expiration Date:   05/02/2024    INTERVAL HISTORY: Kelly [name] is a 83 year old female who is followed by hematology/oncology for MDS with ring sider blast.  At her last visit, her hemoglobin had started to trend down and we  discussed potentially starting luspatercept  should her hemoglobin continue to decline.  She was not interested in starting luspatercept  and wished to continue to monitor.  Since then, she has been more or less stable.  She was evaluated in the emergency room for palpitations and found to have an elevated heart rate.  Cardiology was consulted and she was found to have episodes of atrial flutter with tachycardia that converted to normal sinus rhythm.  She was started on metoprolol  25 mg twice daily.  No anticoagulation recommended at this point.  She does follow-up with cardiology.  Reports she feels more tired since starting the metoprolol .  She does report intermittent dizzy spells especially when she stands.  Appetite 75% energy levels 50%.  Has a headache on occasion.  Reports occasional cramping in her ribs and lower extremities.  We reviewed CBC, CMP, LDH, ferritin and iron panel.  SUMMARY OF HEMATOLOGIC HISTORY: Oncology History   No history exists.     CBC    Component Value Date/Time   WBC 5.8 10/26/2023 1026   RBC 2.80 (L) 10/26/2023 1026   HGB 9.7 (L) 10/26/2023 1026   HCT 29.3 (L) 10/26/2023 1026   PLT 302 10/26/2023 1026   MCV 104.6 (H) 10/26/2023 1026   MCH 34.6 (H) 10/26/2023 1026   MCHC 33.1 10/26/2023 1026   RDW 16.9 (H)  10/26/2023 1026   LYMPHSABS 2.0 10/26/2023 1026   MONOABS 0.7 10/26/2023 1026   EOSABS 0.3 10/26/2023 1026   BASOSABS 0.1 10/26/2023 1026       Latest Ref Rng & Units 10/26/2023   10:26 AM 09/27/2023   12:27 AM 09/26/2023    2:45 PM  CMP  Glucose 70 - 99 mg/dL 828  856  826   BUN 8 - 23 mg/dL 14  16  20    Creatinine 0.44 - 1.00 mg/dL 9.16  9.26  8.99   Sodium 135 - 145 mmol/L 140  140  138   Potassium 3.5 - 5.1 mmol/L 4.2  4.0  3.5   Chloride 98 - 111 mmol/L 107  108  106   CO2 22 - 32 mmol/L 21  22  20    Calcium 8.9 - 10.3 mg/dL 9.3  8.5  9.0   Total Protein 6.5 - 8.1 [name]/dL 7.0     Total Bilirubin 0.0 - 1.2 mg/dL 1.2     Alkaline Phos 38 -  126 U/L 36     AST 15 - 41 U/L 19     ALT 0 - 44 U/L 20        Lab Results  Component Value Date   FERRITIN 318 (H) 10/26/2023   VITAMINB12 446 02/25/2023    Vitals:   11/02/23 1342  BP: 133/64  Pulse: 70  Resp: 18  Temp: 97.8 F (36.6 C)  SpO2: 96%    Review of System:  Review of Systems  Constitutional:  Positive for malaise/fatigue.  Cardiovascular:  Positive for palpitations.  Gastrointestinal:  Positive for diarrhea.  Neurological:  Positive for dizziness and headaches.  Psychiatric/Behavioral:  The patient has insomnia.     Physical Exam: Physical Exam Constitutional:      Appearance: Normal appearance.  HENT:     Head: Normocephalic and atraumatic.  Eyes:     Pupils: Pupils are equal, round, and reactive to light.  Cardiovascular:     Rate and Rhythm: Normal rate and regular rhythm.     Heart sounds: Normal heart sounds. No murmur heard. Pulmonary:     Effort: Pulmonary effort is normal.     Breath sounds: Normal breath sounds. No wheezing.  Abdominal:     General: Bowel sounds are normal. There is no distension.     Palpations: Abdomen is soft.     Tenderness: There is no abdominal tenderness.  Musculoskeletal:        General: Normal range of motion.     Cervical back: Normal range of motion.  Skin:    General: Skin is warm and dry.     Findings: No rash.  Neurological:     Mental Status: She is alert and oriented to person, place, and time.     Gait: Gait is intact.  Psychiatric:        Mood and Affect: Mood and affect normal.        Cognition and Memory: Memory normal.        Judgment: Judgment normal.      I spent 25 minutes dedicated to the care of this patient (face-to-face and non-face-to-face) on the date of the encounter to include what is described in the assessment and plan.,  Delon Hope, NP 11/02/2023 3:22 PM

## 2023-11-02 NOTE — Assessment & Plan Note (Addendum)
-  Had bone marrow biopsy on 04/15/2022 which showed hypercellular dyspoietic changes associated with ring sideroblasts more than 15% of erythroid precursors.  No increase in blasts noted.  No mono clonal B-cell population.  No increase in plasma cells. - NGS: SF3B1 - MDS FISH: Normal.  Cytogenetics: 46, XX[20] -She is currently not on treatment.  We have been monitoring her labs about every 3 to 4 months. - Hemoglobin has more or less remained stable.  Current hemoglobin is 9.7. -She is hoping to avoid starting luspatercept  at this time. -Recommend follow-up in 3 months with labs a few days before and office visit.

## 2023-11-02 NOTE — Assessment & Plan Note (Addendum)
 Etiology felt to be due to underlying MDS. At her last visit, we discussed potentially initiating luspatercept  should her hemoglobin continue to trend down. Most recent labs showed improvement of her hemoglobin from 9.6-9.7. We discussed continuing to monitor every 3 months for now.

## 2023-11-02 NOTE — Patient Instructions (Signed)
 Start Mag supplements at bedtime to help with cramps and sleep. Start with 250 mg 30 min before bed.   RTC in 3 months for labs and in 6 mont with labs and see NP.

## 2023-11-06 ENCOUNTER — Ambulatory Visit: Payer: Self-pay | Admitting: Family Medicine

## 2023-11-08 NOTE — Progress Notes (Unsigned)
 Cardiology Office Note    Date:  11/09/2023  ID:  Kelly Cook, DOB Jun 26, 1940, MRN [id number] Cardiologist: Alvan Carrier, MD Cardiology APP:  Johnson Laymon HERO, PA-C { :  History of Present Illness:    Kelly Cook is a 83 y.o. female with past medical history of atypical chest pain (occurring in 07/2019 and not requiring further ischemic testing at that time), hypothyroidism, Type II DM, carotid artery stenosis (dopplers in 02/2023 showing 50-69% LICA stenosis) and MDS who presents to the office today for hospital follow-up.  She was most recently admitted to [facility] in 09/2023 for evaluation of worsening palpitations and had been under increased stress. Electrolytes and TSH were within a normal range. Echocardiogram showed a preserved EF of 60 to 65% with no regional wall motion abnormalities, moderate LVH, normal RV function and no significant valve abnormalities. Telemetry appeared most consistent with atrial tachycardia as compared to atrial flutter. She had initially been started on anticoagulation at the time of admission and this was discontinued. She was continued on Lopressor  25 mg twice daily and a 2-week outpatient Zio patch was recommended for further assessment.  The preliminary report of her monitor shows predominantly normal sinus rhythm with an average heart rate of 74 bpm. Slight P wave morphology changes were noted and she did have 1 run of NSVT for 4 beats and 304 episodes of SVT with the longest lasting for 52.2 seconds. She did have rare PAC's and PVC's but less than 1% burden.  In talking with the patient today, she reports still having occasional palpitations and feels like these typically occur when her next dose of Toprol -XL is due. She reports having significant fatigue with Lopressor  and was switched to Toprol -XL by her PCP and has overall tolerated well. She denies any exertional chest pain or progressive dyspnea on exertion. No specific orthopnea,  PND or pitting edema. Does have occasional ankle edema due to varicose veins. She only consumes 1 cup of coffee a day and otherwise no caffeinated beverages and no alcohol use.  Studies Reviewed:   EKG: EKG is not ordered today.  Echocardiogram: 09/2023 IMPRESSIONS     1. Left ventricular ejection fraction, by estimation, is 60 to 65%. The  left ventricle has normal function. The left ventricle has no regional  wall motion abnormalities. There is moderate left ventricular hypertrophy  of the basal-septal segment. Left  ventricular diastolic parameters are indeterminate.   2. Right ventricular systolic function is normal. The right ventricular  size is normal. Tricuspid regurgitation signal is inadequate for assessing  PA pressure.   3. The mitral valve is normal in structure. No evidence of mitral valve  regurgitation. No evidence of mitral stenosis.   4. The aortic valve is tricuspid. Aortic valve regurgitation is not  visualized.   5. The inferior vena cava is normal in size with greater than 50%  respiratory variability, suggesting right atrial pressure of 3 mmHg.   Event Monitor: 10/2023 Patient had a min HR of 56 bpm, max HR of 160 bpm, and avg HR of 74 bpm. Predominant underlying rhythm was Sinus Rhythm. Slight P wave morphology changes were noted. 1 run of Ventricular Tachycardia occurred lasting 4 beats with a max rate of 152 bpm  (avg 137 bpm). 304 Supraventricular Tachycardia runs occurred, the run with the fastest interval lasting 12 beats with a max rate of 160 bpm, the longest lasting 52.2 secs with an avg rate of 114 bpm. Isolated SVEs were rare (<1.0%),  SVE Couplets were  rare (<1.0%), and SVE Triplets were rare (<1.0%). Isolated VEs were rare (<1.0%, 173), VE Couplets were rare (<1.0%, 1), and VE Triplets were rare (<1.0%, 1). Ventricular Bigeminy was present.   Physical Exam:   VS:  BP 130/68   Pulse 76   Ht 5' 1.5 (1.562 m)   Wt 153 lb (69.4 kg)   SpO2 93%    BMI 28.44 kg/m    Wt Readings from Last 3 Encounters:  11/09/23 153 lb (69.4 kg)  11/02/23 150 lb 6.4 oz (68.2 kg)  10/18/23 152 lb (68.9 kg)     GEN: Well nourished, well developed female appearing in no acute distress NECK: No JVD; No carotid bruits CARDIAC: RRR, no murmurs, rubs, gallops RESPIRATORY:  Clear to auscultation without rales, wheezing or rhonchi  ABDOMEN: Appears non-distended. No obvious abdominal masses. EXTREMITIES: No clubbing or cyanosis. No pitting edema.  Distal pedal pulses are 2+ bilaterally.   Assessment and Plan:   1. Atrial tachycardia/SVT - Diagnosed during her recent admission and outpatient monitor showed predominantly normal sinus rhythm but she did have frequent episodes of SVT but the longest episode lasted for 52 seconds. Was switched from Lopressor  to Toprol -XL due to fatigue and has overall been tolerating this well except for occasional breakthrough episodes when her next dose is due.  Reviewed options with the patient and will titrate to 37.5 mg daily. Encouraged her to make us  aware if symptoms persist as we may need to ultimately titrate to 50 mg daily but will make gradual adjustments.   2. Bilateral carotid artery stenosis - Carotid dopplers in 02/2023 showed minimal plaque along the RICA and 50 to 69% plaque along the LICA. Appears she has not been started on statin therapy by her PCP as LDL was at 60 when checked in 07/2023.  3. Hypothyroidism, unspecified type - TSH was 1.890 when checked in 09/2023. Remains on Levothyroxine  100 mcg daily.   Signed, Laymon CHRISTELLA Qua, PA-C

## 2023-11-09 ENCOUNTER — Encounter: Payer: Self-pay | Admitting: Student

## 2023-11-09 ENCOUNTER — Ambulatory Visit: Attending: Student | Admitting: Student

## 2023-11-09 VITALS — BP 130/68 | HR 76 | Ht 61.5 in | Wt 153.0 lb

## 2023-11-09 DIAGNOSIS — I6523 Occlusion and stenosis of bilateral carotid arteries: Secondary | ICD-10-CM | POA: Diagnosis not present

## 2023-11-09 DIAGNOSIS — E039 Hypothyroidism, unspecified: Secondary | ICD-10-CM | POA: Diagnosis not present

## 2023-11-09 DIAGNOSIS — I4719 Other supraventricular tachycardia: Secondary | ICD-10-CM

## 2023-11-09 MED ORDER — METOPROLOL SUCCINATE ER 25 MG PO TB24
37.5000 mg | ORAL_TABLET | Freq: Every day | ORAL | 3 refills | Status: AC
Start: 2023-11-09 — End: ?

## 2023-11-09 NOTE — Patient Instructions (Signed)
 Medication Instructions:   Increase Toprol  XL to 37.5 mg Daily   *If you need a refill on your cardiac medications before your next appointment, please call your pharmacy*  Lab Work: NONE   If you have labs (blood work) drawn today and your tests are completely normal, you will receive your results only by: MyChart Message (if you have MyChart) OR A paper copy in the mail If you have any lab test that is abnormal or we need to change your treatment, we will call you to review the results.  Testing/Procedures: NONE   Follow-Up: At [facility], you and your health needs are our priority.  As part of our continuing mission to provide you with exceptional heart care, our providers are all part of one team.  This team includes your primary Cardiologist (physician) and Advanced Practice Providers or APPs (Physician Assistants and Nurse Practitioners) who all work together to provide you with the care you need, when you need it.  Your next appointment:   3 month(s)  Provider:   You may see Alvan Carrier, MD or one of the following Advanced Practice Providers on your designated Care Team:   Laymon Qua, PA-C  [place], [place] Olivia [place], [place]     We recommend signing up for the patient portal called MyChart.  Sign up information is provided on this After Visit Summary.  MyChart is used to connect with patients for Virtual Visits (Telemedicine).  Patients are able to view lab/test results, encounter notes, upcoming appointments, etc.  Non-urgent messages can be sent to your provider as well.   To learn more about what you can do with MyChart, go to [email or web address].   Other Instructions Thank you for choosing [facility]!

## 2023-11-10 ENCOUNTER — Ambulatory Visit (INDEPENDENT_AMBULATORY_CARE_PROVIDER_SITE_OTHER)

## 2023-11-10 DIAGNOSIS — Z23 Encounter for immunization: Secondary | ICD-10-CM

## 2023-12-02 DIAGNOSIS — E11620 Type 2 diabetes mellitus with diabetic dermatitis: Secondary | ICD-10-CM | POA: Diagnosis not present

## 2023-12-02 DIAGNOSIS — E039 Hypothyroidism, unspecified: Secondary | ICD-10-CM | POA: Diagnosis not present

## 2023-12-02 DIAGNOSIS — K219 Gastro-esophageal reflux disease without esophagitis: Secondary | ICD-10-CM | POA: Diagnosis not present

## 2023-12-02 DIAGNOSIS — E1122 Type 2 diabetes mellitus with diabetic chronic kidney disease: Secondary | ICD-10-CM | POA: Diagnosis not present

## 2023-12-02 DIAGNOSIS — N1831 Chronic kidney disease, stage 3a: Secondary | ICD-10-CM | POA: Diagnosis not present

## 2023-12-02 DIAGNOSIS — Z7982 Long term (current) use of aspirin: Secondary | ICD-10-CM | POA: Diagnosis not present

## 2023-12-02 DIAGNOSIS — M1990 Unspecified osteoarthritis, unspecified site: Secondary | ICD-10-CM | POA: Diagnosis not present

## 2023-12-02 DIAGNOSIS — R32 Unspecified urinary incontinence: Secondary | ICD-10-CM | POA: Diagnosis not present

## 2023-12-02 DIAGNOSIS — I4892 Unspecified atrial flutter: Secondary | ICD-10-CM | POA: Diagnosis not present

## 2024-01-01 DIAGNOSIS — I4892 Unspecified atrial flutter: Secondary | ICD-10-CM

## 2024-01-05 ENCOUNTER — Ambulatory Visit (HOSPITAL_COMMUNITY)
Admission: RE | Admit: 2024-01-05 | Discharge: 2024-01-05 | Disposition: A | Source: Ambulatory Visit | Attending: Family Medicine | Admitting: Family Medicine

## 2024-01-05 ENCOUNTER — Telehealth: Payer: Self-pay | Admitting: Family Medicine

## 2024-01-05 ENCOUNTER — Other Ambulatory Visit: Payer: Self-pay | Admitting: Family Medicine

## 2024-01-05 ENCOUNTER — Ambulatory Visit: Admitting: Family Medicine

## 2024-01-05 ENCOUNTER — Encounter: Payer: Self-pay | Admitting: Family Medicine

## 2024-01-05 VITALS — BP 102/69 | HR 72 | Temp 97.5°F | Ht 61.5 in | Wt 156.0 lb

## 2024-01-05 DIAGNOSIS — R519 Headache, unspecified: Secondary | ICD-10-CM

## 2024-01-05 MED ORDER — BACLOFEN 10 MG PO TABS: 5.0000 mg | ORAL_TABLET | Freq: Every evening | ORAL | 0 refills | Status: AC | PRN

## 2024-01-05 NOTE — Progress Notes (Addendum)
 Subjective:  Patient ID: Kelly Cook, female    DOB: July 20, 1940  Age: 83 y.o. MRN: [id number]  CC:   Chief Complaint  Patient presents with   headaches moving around all sides     3 weeks -nasal cavity pain and headaches, possible sinus infection    HPI:  83 year old female presents for evaluation of the above.  Patient reports a 3-week history of headache.  She has never had significant headaches in the past.  Started in the occipital region.  She is now having headache particular in the frontal region.  She is also had a headache on the top of the head.  This is very out of the ordinary for her.  No sinus pressure or congestion.  No runny nose.  Does not feel sick.  She takes Tylenol  with improvement.  No known inciting factor.  No known exacerbating factors.  Patient Active Problem List   Diagnosis Date Noted   New onset headache 01/05/2024   Suspected COVID-19 virus infection 10/18/2023   Age-related osteoporosis without current pathological fracture 10/16/2023   Atrial tachycardia 10/03/2023   Vitamin D  deficiency 09/26/2023   Anemia 09/26/2023   S/P hysterectomy 04/07/2023   Vaginal atrophy 04/07/2023   MDS (myelodysplastic syndrome) (HCC) 04/21/2022   Urinary incontinence 07/02/2015   Hypothyroid 06/16/2012   Diabetes (HCC) 06/16/2012    Social Hx   Social History   Socioeconomic History   Marital status: Widowed    Spouse name: Not on file   Number of children: Not on file   Years of education: Not on file   Highest education level: Not on file  Occupational History   Not on file  Tobacco Use   Smoking status: Former    Types: Cigarettes   Smokeless tobacco: Never  Vaping Use   Vaping status: Never Used  Substance and Sexual Activity   Alcohol use: No   Drug use: No   Sexual activity: Not Currently    Birth control/protection: Surgical    Comment: hyst  Other Topics Concern   Not on file  Social History Narrative   Not on file   Social  Drivers of Health   Financial Resource Strain: Low Risk  (04/07/2023)   Overall Financial Resource Strain (CARDIA)    Difficulty of Paying Living Expenses: Not very hard  Food Insecurity: No Food Insecurity (09/26/2023)   Hunger Vital Sign    Worried About Running Out of Food in the Last Year: Never true    Ran Out of Food in the Last Year: Never true  Transportation Needs: No Transportation Needs (09/26/2023)   PRAPARE - Administrator, Civil Service (Medical): No    Lack of Transportation (Non-Medical): No  Physical Activity: Insufficiently Active (04/07/2023)   Exercise Vital Sign    Days of Exercise per Week: 2 days    Minutes of Exercise per Session: 40 min  Stress: Stress Concern Present (04/07/2023)   [employer] of Occupational Health - Occupational Stress Questionnaire    Feeling of Stress : To some extent  Social Connections: Moderately Integrated (09/26/2023)   Social Connection and Isolation Panel    Frequency of Communication with Friends and Family: More than three times a week    Frequency of Social Gatherings with Friends and Family: Twice a week    Attends Religious Services: More than 4 times per year    Active Member of [employer] or Organizations: Yes    Attends Banker  Meetings: More than 4 times per year    Marital Status: Widowed    Review of Systems Per HPI  Objective:  BP 102/69   Pulse 72   Temp (!) 97.5 F (36.4 C)   Ht 5' 1.5 (1.562 m)   Wt 156 lb (70.8 kg)   SpO2 95%   BMI 29.00 kg/m      01/05/2024   10:29 AM 11/09/2023    9:19 AM 11/02/2023    1:42 PM  BP/Weight  Systolic BP 102 130 133  Diastolic BP 69 68 64  Wt. (Lbs) 156 153 150.4  BMI 29 kg/m2 28.44 kg/m2 27.96 kg/m2    Physical Exam Vitals and nursing note reviewed.  Constitutional:      General: She is not in acute distress.    Appearance: Normal appearance.  HENT:     Head: Normocephalic and atraumatic.  Cardiovascular:     Rate and Rhythm: Normal rate  and regular rhythm.  Pulmonary:     Effort: Pulmonary effort is normal.     Breath sounds: Normal breath sounds.  Neurological:     General: No focal deficit present.     Mental Status: She is alert.     Cranial Nerves: No cranial nerve deficit.     Motor: No weakness.  Psychiatric:        Mood and Affect: Mood normal.        Behavior: Behavior normal.     Lab Results  Component Value Date   WBC 5.8 10/26/2023   HGB 9.7 (L) 10/26/2023   HCT 29.3 (L) 10/26/2023   PLT 302 10/26/2023   GLUCOSE 171 (H) 10/26/2023   ALT 20 10/26/2023   AST 19 10/26/2023   NA 140 10/26/2023   K 4.2 10/26/2023   CL 107 10/26/2023   CREATININE 0.83 10/26/2023   BUN 14 10/26/2023   CO2 21 (L) 10/26/2023   TSH 1.890 09/27/2023   INR 1.2 09/27/2023   HGBA1C 6.7 (H) 09/26/2023     Assessment & Plan:  New onset headache Assessment & Plan: Etiology and prognosis unclear at this time.  This is quite worrisome given new onset headache at her age.  I discussed case with one of our other providers Marletta Quarry NP) who agrees with me.  Arranging stat CT head for further evaluation.  Orders: -     CT HEAD WO CONTRAST ( )    Follow-up:  Pending CT  Telitha Plath DO [facility]

## 2024-01-05 NOTE — Telephone Encounter (Signed)
 Copied from CRM #[id number]. Topic: General - Other >> Jan 05, 2024  4:04 PM Winona R wrote: Pt states she is expecting a call from Dr. Bluford and wanted to make sure he has her cell. As she will be leaving the house. Please call her mobile [phone]

## 2024-01-05 NOTE — Addendum Note (Signed)
 Addended by: BLUFORD JACQULYN MATSU on: 01/05/2024 12:09 PM   Modules accepted: Level of Service

## 2024-01-05 NOTE — Patient Instructions (Signed)
 Arranging CT.   I will call with results.

## 2024-01-05 NOTE — Assessment & Plan Note (Addendum)
 Etiology and prognosis unclear at this time.  This is quite worrisome given new onset headache at her age.  I discussed case with one of our other providers Marletta Quarry NP) who agrees with me.  Arranging stat CT head for further evaluation.

## 2024-01-06 ENCOUNTER — Ambulatory Visit (HOSPITAL_COMMUNITY)

## 2024-01-06 DIAGNOSIS — R519 Headache, unspecified: Secondary | ICD-10-CM | POA: Diagnosis not present

## 2024-01-07 LAB — SEDIMENTATION RATE: Sed Rate: 2 mm/h (ref 0–40)

## 2024-01-07 LAB — C-REACTIVE PROTEIN: CRP: <1 mg/L (ref 0–10)

## 2024-01-08 ENCOUNTER — Ambulatory Visit: Payer: Self-pay | Admitting: Family Medicine

## 2024-01-10 ENCOUNTER — Ambulatory Visit

## 2024-01-10 DIAGNOSIS — M542 Cervicalgia: Secondary | ICD-10-CM | POA: Diagnosis not present

## 2024-01-10 DIAGNOSIS — R519 Headache, unspecified: Secondary | ICD-10-CM | POA: Diagnosis not present

## 2024-01-18 ENCOUNTER — Ambulatory Visit: Payer: Self-pay | Admitting: Cardiology

## 2024-01-20 ENCOUNTER — Ambulatory Visit

## 2024-01-20 ENCOUNTER — Telehealth: Payer: Self-pay | Admitting: Family Medicine

## 2024-01-20 VITALS — Ht 61.5 in | Wt 156.0 lb

## 2024-01-20 DIAGNOSIS — Z0000 Encounter for general adult medical examination without abnormal findings: Secondary | ICD-10-CM

## 2024-01-20 NOTE — Telephone Encounter (Signed)
 Copied from CRM #[id number]. Topic: General - Call Back - No Documentation >> Jan 20, 2024 12:50 PM Kelly Cook wrote: Reason for CRM: Patient calling back but not sure about who called her, but she has an appointment in 10 minutes.

## 2024-02-01 ENCOUNTER — Inpatient Hospital Stay: Attending: Hematology

## 2024-02-01 DIAGNOSIS — D649 Anemia, unspecified: Secondary | ICD-10-CM

## 2024-02-01 DIAGNOSIS — D469 Myelodysplastic syndrome, unspecified: Secondary | ICD-10-CM | POA: Insufficient documentation

## 2024-02-01 LAB — COMPREHENSIVE METABOLIC PANEL WITH GFR
ALT: 18 U/L (ref 0–44)
AST: 17 U/L (ref 15–41)
Albumin: 4.2 g/dL (ref 3.5–5.0)
Alkaline Phosphatase: 48 U/L (ref 38–126)
Anion gap: 14 (ref 5–15)
BUN: 20 mg/dL (ref 8–23)
CO2: 21 mmol/L — ABNORMAL LOW (ref 22–32)
Calcium: 9.1 mg/dL (ref 8.9–10.3)
Chloride: 104 mmol/L (ref 98–111)
Creatinine, Ser: 0.93 mg/dL (ref 0.44–1.00)
GFR, Estimated: >60 mL/min
Glucose, Bld: 210 mg/dL — ABNORMAL HIGH (ref 70–99)
Potassium: 4.1 mmol/L (ref 3.5–5.1)
Sodium: 139 mmol/L (ref 135–145)
Total Bilirubin: 0.8 mg/dL (ref 0.0–1.2)
Total Protein: 7.2 g/dL (ref 6.5–8.1)

## 2024-02-01 LAB — CBC WITH DIFFERENTIAL/PLATELET
Abs Immature Granulocytes: 0.04 K/uL (ref 0.00–0.07)
Basophils Absolute: 0.1 K/uL (ref 0.0–0.1)
Basophils Relative: 1 %
Eosinophils Absolute: 0.2 K/uL (ref 0.0–0.5)
Eosinophils Relative: 2 %
HCT: 29.8 % — ABNORMAL LOW (ref 36.0–46.0)
Hemoglobin: 9.9 g/dL — ABNORMAL LOW (ref 12.0–15.0)
Immature Granulocytes: 1 %
Lymphocytes Relative: 36 %
Lymphs Abs: 2.4 K/uL (ref 0.7–4.0)
MCH: 36.4 pg — ABNORMAL HIGH (ref 26.0–34.0)
MCHC: 33.2 g/dL (ref 30.0–36.0)
MCV: 109.6 fL — ABNORMAL HIGH (ref 80.0–100.0)
Monocytes Absolute: 0.6 K/uL (ref 0.1–1.0)
Monocytes Relative: 10 %
Neutro Abs: 3.3 K/uL (ref 1.7–7.7)
Neutrophils Relative %: 50 %
Platelets: 287 K/uL (ref 150–400)
RBC: 2.72 MIL/uL — ABNORMAL LOW (ref 3.87–5.11)
RDW: 16.5 % — ABNORMAL HIGH (ref 11.5–15.5)
WBC: 6.6 K/uL (ref 4.0–10.5)
nRBC: 1.5 % — ABNORMAL HIGH (ref 0.0–0.2)

## 2024-02-01 LAB — IRON AND TIBC
Iron: 120 ug/dL (ref 28–170)
Saturation Ratios: 37 % — ABNORMAL HIGH (ref 10.4–31.8)
TIBC: 322 ug/dL (ref 250–450)
UIBC: 202 ug/dL

## 2024-02-01 LAB — FERRITIN: Ferritin: 433 ng/mL — ABNORMAL HIGH (ref 11–307)

## 2024-02-01 LAB — LACTATE DEHYDROGENASE: LDH: 171 U/L (ref 105–235)

## 2024-02-09 ENCOUNTER — Ambulatory Visit: Admitting: Cardiology

## 2024-02-15 ENCOUNTER — Ambulatory Visit: Admitting: Family Medicine

## 2024-02-15 ENCOUNTER — Encounter: Payer: Self-pay | Admitting: Family Medicine

## 2024-02-15 VITALS — BP 132/52 | HR 79 | Temp 97.2°F | Ht 61.5 in | Wt 156.0 lb

## 2024-02-15 DIAGNOSIS — Z23 Encounter for immunization: Secondary | ICD-10-CM | POA: Diagnosis not present

## 2024-02-15 DIAGNOSIS — Z7984 Long term (current) use of oral hypoglycemic drugs: Secondary | ICD-10-CM

## 2024-02-15 DIAGNOSIS — E039 Hypothyroidism, unspecified: Secondary | ICD-10-CM | POA: Diagnosis not present

## 2024-02-15 DIAGNOSIS — H5712 Ocular pain, left eye: Secondary | ICD-10-CM

## 2024-02-15 DIAGNOSIS — E119 Type 2 diabetes mellitus without complications: Secondary | ICD-10-CM | POA: Diagnosis not present

## 2024-02-15 DIAGNOSIS — D469 Myelodysplastic syndrome, unspecified: Secondary | ICD-10-CM | POA: Diagnosis not present

## 2024-02-15 NOTE — Assessment & Plan Note (Signed)
 Stable. Follows with Heme/Onc.

## 2024-02-15 NOTE — Assessment & Plan Note (Signed)
 Stable  TSH today

## 2024-02-15 NOTE — Assessment & Plan Note (Signed)
 Stable. Continue metformin . A1c ordered.

## 2024-02-15 NOTE — Progress Notes (Signed)
 "  Subjective:  Patient ID: Kelly Cook, female    DOB: [birth year]  Age: 84 y.o. MRN: [id number]  CC:   Chief Complaint  Patient presents with   6 month follow up     HPI:  84 year old female presents for follow-up.  Patient reports that she has had ongoing issues with left eye pain.  She states that she was recently seen by an eye doctor and was told that she has underlying issue but that this would have to be addressed at follow-up.  She is unhappy about this.  She would like to see another ophthalmologist.  No current eye redness or discharge.  Patient is in need of pneumococcal vaccine.  She is amenable to this today.  Needs foot exam as well.  Also needs labs.  A1c has been at goal.  She is compliant with metformin .  Needs assessment of lipids today.  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   Urinary incontinence 07/02/2015   Hypothyroid 06/16/2012   Diabetes (HCC) 06/16/2012    Social Hx   Social History   Socioeconomic History   Marital status: Widowed    Spouse name: Not on file   Number of children: Not on file   Years of education: Not on file   Highest education level: Not on file  Occupational History   Not on file  Tobacco Use   Smoking status: Former    Types: Cigarettes   Smokeless tobacco: Never  Vaping Use   Vaping status: Never Used  Substance and Sexual Activity   Alcohol use: No   Drug use: No   Sexual activity: Not Currently    Birth control/protection: Surgical    Comment: hyst  Other Topics Concern   Not on file  Social History Narrative   Not on file   Social Drivers of Health    Tobacco Use: Medium Risk (02/15/2024)   Patient History    Smoking Tobacco Use: Former    Smokeless Tobacco Use: Never    Passive Exposure: Not on file  Financial Resource Strain: Low Risk (04/07/2023)   Overall Financial Resource Strain (CARDIA)    Difficulty of Paying Living Expenses: Not very hard  Food Insecurity: No Food Insecurity (02/07/2024)   Epic    Worried About Radiation Protection Practitioner of Food in the Last Year: Never true    Ran Out of Food in the Last Year: Never true  Transportation Needs: No Transportation Needs (02/07/2024)   Epic    Lack of Transportation (Medical): No    Lack of Transportation (Non-Medical): No  Physical Activity: Insufficiently Active (02/07/2024)   Exercise Vital Sign    Days of Exercise per Week: 2 days    Minutes of Exercise per Session: 40 min  Stress: No Stress Concern Present (02/07/2024)   [employer] of Occupational Health - Occupational Stress Questionnaire    Feeling of Stress: Only a little  Social Connections: Moderately Integrated (02/07/2024)   Social Connection and Isolation Panel    Frequency of Communication with Friends and Family: More than three times a week    Frequency of Social Gatherings with Friends and Family: Twice a week  Attends Religious Services: More than 4 times per year    Active Member of Clubs or Organizations: Yes    Attends Banker Meetings: More than 4 times per year    Marital Status: Widowed  Depression (PHQ2-9): Low Risk (02/15/2024)   Depression (PHQ2-9)    PHQ-2 Score: 0  Alcohol Screen: Low Risk (04/07/2023)   Alcohol Screen    Last Alcohol Screening Score (AUDIT): 0  Housing: Low Risk (02/07/2024)   Epic    Unable to Pay for Housing in the Last Year: No    Number of Times Moved in the Last Year: 0    Homeless in the Last Year: No  Utilities: Not At Risk (02/07/2024)   Epic    Threatened with loss of utilities: No  Health Literacy: Adequate Health Literacy (02/07/2024)   B1300 Health Literacy     Frequency of need for help with medical instructions: Never    Review of Systems Per HPI  Objective:  BP (!) 132/52   Pulse 79   Temp (!) 97.2 F (36.2 C)   Ht 5' 1.5 (1.562 m)   Wt 156 lb (70.8 kg)   SpO2 97%   BMI 29.00 kg/m      02/15/2024    9:08 AM 02/07/2024    4:29 PM 01/05/2024   10:29 AM  BP/Weight  Systolic BP 132 -- 102  Diastolic BP 52 -- 69  Wt. (Lbs) 156 156 156  BMI 29 kg/m2 29 kg/m2 29 kg/m2    Physical Exam Constitutional:      Appearance: Normal appearance.  HENT:     Head: Normocephalic and atraumatic.  Eyes:     General:        Right eye: No discharge.        Left eye: No discharge.     Conjunctiva/sclera: Conjunctivae normal.  Cardiovascular:     Rate and Rhythm: Normal rate and regular rhythm.  Pulmonary:     Effort: Pulmonary effort is normal.     Breath sounds: Normal breath sounds. No wheezing, rhonchi or rales.  Feet:     Comments: Diabetic foot exam performed today.  See quality metrics section. Neurological:     Mental Status: She is alert.     Lab Results  Component Value Date   WBC 6.6 02/01/2024   HGB 9.9 (L) 02/01/2024   HCT 29.8 (L) 02/01/2024   PLT 287 02/01/2024   GLUCOSE 210 (H) 02/01/2024   ALT 18 02/01/2024   AST 17 02/01/2024   NA 139 02/01/2024   K 4.1 02/01/2024   CL 104 02/01/2024   CREATININE 0.93 02/01/2024   BUN 20 02/01/2024   CO2 21 (L) 02/01/2024   TSH 1.890 09/27/2023   INR 1.2 09/27/2023   HGBA1C 6.7 (H) 09/26/2023     Assessment & Plan:  Type 2 diabetes mellitus without complication, without long-term current use of insulin  (HCC) Assessment & Plan: Stable. Continue metformin . A1c ordered.    Orders: -     Hemoglobin A1c -     Lipid panel -     Microalbumin / creatinine urine ratio  Immunization due -     Pneumococcal conjugate vaccine 20-valent  Hypothyroidism, unspecified type Assessment & Plan: Stable. TSH today.   Orders: -     TSH  MDS (myelodysplastic syndrome)  (HCC) Assessment & Plan: Stable. Follows with Heme/Onc.   Left eye pain Assessment & Plan: I called and spoke with [facility] today. Secure appt for tomorrow.  Follow-up:  6 months  Catina Nuss Bluford DO [facility] "

## 2024-02-15 NOTE — Patient Instructions (Signed)
 Labs ordered.   Appt 1/15 @ 1:20 [address] [place], [place]  [address]  Phone: [phone]  Follow up in 6 months.

## 2024-02-15 NOTE — Assessment & Plan Note (Signed)
 I called and spoke with [facility] today. Secure appt for tomorrow.

## 2024-02-21 ENCOUNTER — Ambulatory Visit: Payer: Self-pay | Admitting: Family Medicine

## 2024-02-21 ENCOUNTER — Other Ambulatory Visit: Payer: Self-pay | Admitting: Family Medicine

## 2024-02-21 LAB — LIPID PANEL
Chol/HDL Ratio: 3.2 ratio (ref 0.0–4.4)
Cholesterol, Total: 156 mg/dL (ref 100–199)
HDL: 49 mg/dL
LDL Chol Calc (NIH): 71 mg/dL (ref 0–99)
Triglycerides: 221 mg/dL — ABNORMAL HIGH (ref 0–149)
VLDL Cholesterol Cal: 36 mg/dL (ref 5–40)

## 2024-02-21 LAB — MICROALBUMIN / CREATININE URINE RATIO
Creatinine, Urine: 41.7 mg/dL
Microalb/Creat Ratio: 14 mg/g{creat} (ref 0–29)
Microalbumin, Urine: 5.9 ug/mL

## 2024-02-21 LAB — HEMOGLOBIN A1C
Est. average glucose Bld gHb Est-mCnc: 148 mg/dL
Hgb A1c MFr Bld: 6.8 % — ABNORMAL HIGH (ref 4.8–5.6)

## 2024-02-21 LAB — TSH: TSH: 4.94 u[IU]/mL — ABNORMAL HIGH (ref 0.450–4.500)

## 2024-02-21 MED ORDER — LEVOTHYROXINE SODIUM 112 MCG PO TABS: 112.0000 ug | ORAL_TABLET | Freq: Every day | ORAL | 1 refills | Status: AC

## 2024-03-06 NOTE — Patient Instructions (Signed)
 Ms. Short,  Thank you for taking the time for your Medicare Wellness Visit. I appreciate your continued commitment to your health goals. Please review the care plan we discussed, and feel free to reach out if I can assist you further.  Please note that Annual Wellness Visits do not include a physical exam. Some assessments may be limited, especially if the visit was conducted virtually. If needed, we may recommend an in-person follow-up with your provider.  Ongoing Care Seeing your primary care provider every 3 to 6 months helps us  monitor your health and provide consistent, personalized care.   Referrals If a referral was made during today's visit and you haven't received any updates within two weeks, please contact the referred provider directly to check on the status.  Recommended Screenings:  Health Maintenance  Topic Date Due   Zoster (Shingles) Vaccine (1 of 2) Never done   DTaP/Tdap/Td vaccine (2 - Td or Tdap) 03/09/2015   COVID-19 Vaccine (2 - Pfizer risk series) 11/08/2019   Medicare Annual Wellness Visit  07/20/2024   Hemoglobin A1C  08/19/2024   Breast Cancer Screening  11/01/2024   Yearly kidney function blood test for diabetes  01/31/2025   Eye exam for diabetics  02/08/2025   Complete foot exam   02/14/2025   Kidney health urinalysis for diabetes  02/19/2025   Osteoporosis screening with Bone Density Scan  11/01/2025   Pneumococcal Vaccine for age over 71  Completed   Flu Shot  Completed   Meningitis B Vaccine  Aged Out       02/07/2024    4:31 PM  Advanced Directives  Does Patient Have a Medical Advance Directive? No  Would patient like information on creating a medical advance directive? Yes (MAU/Ambulatory/Procedural Areas - Information given)    Vision: Annual vision screenings are recommended for early detection of glaucoma, cataracts, and diabetic retinopathy. These exams can also reveal signs of chronic conditions such as diabetes and high blood  pressure.  Dental: Annual dental screenings help detect early signs of oral cancer, gum disease, and other conditions linked to overall health, including heart disease and diabetes.  Please see the attached documents for additional preventive care recommendations.

## 2024-05-02 ENCOUNTER — Other Ambulatory Visit: Attending: Hematology

## 2024-05-02 ENCOUNTER — Ambulatory Visit: Admitting: Cardiology

## 2024-05-09 ENCOUNTER — Ambulatory Visit: Admitting: Oncology

## 2024-05-09 ENCOUNTER — Inpatient Hospital Stay: Admitting: Oncology

## 2024-05-15 ENCOUNTER — Inpatient Hospital Stay: Admitting: Oncology

## 2024-08-14 ENCOUNTER — Ambulatory Visit: Admitting: Family Medicine
